# Patient Record
Sex: Male | Born: 1960 | Race: White | Hispanic: No | Smoking: Current some day smoker
Health system: Southern US, Community
[De-identification: ages and names within clinical notes are randomized; demographics above are authoritative.]

## PROBLEM LIST (undated history)

## (undated) DIAGNOSIS — F4310 Post-traumatic stress disorder, unspecified: Secondary | ICD-10-CM

## (undated) DIAGNOSIS — Z87442 Personal history of urinary calculi: Secondary | ICD-10-CM

## (undated) DIAGNOSIS — T148XXA Other injury of unspecified body region, initial encounter: Secondary | ICD-10-CM

## (undated) DIAGNOSIS — R7303 Prediabetes: Secondary | ICD-10-CM

## (undated) DIAGNOSIS — J45909 Unspecified asthma, uncomplicated: Secondary | ICD-10-CM

## (undated) DIAGNOSIS — J189 Pneumonia, unspecified organism: Secondary | ICD-10-CM

## (undated) DIAGNOSIS — R51 Headache: Secondary | ICD-10-CM

## (undated) DIAGNOSIS — J302 Other seasonal allergic rhinitis: Secondary | ICD-10-CM

## (undated) DIAGNOSIS — M1611 Unilateral primary osteoarthritis, right hip: Secondary | ICD-10-CM

## (undated) DIAGNOSIS — R519 Headache, unspecified: Secondary | ICD-10-CM

## (undated) DIAGNOSIS — I82409 Acute embolism and thrombosis of unspecified deep veins of unspecified lower extremity: Secondary | ICD-10-CM

## (undated) DIAGNOSIS — M1990 Unspecified osteoarthritis, unspecified site: Secondary | ICD-10-CM

## (undated) HISTORY — PX: HERNIA REPAIR: SHX51

## (undated) HISTORY — DX: Unilateral primary osteoarthritis, right hip: M16.11

## (undated) HISTORY — DX: Prediabetes: R73.03

## (undated) HISTORY — PX: NASAL FRACTURE SURGERY: SHX718

## (undated) HISTORY — PX: APPENDECTOMY: SHX54

---

## 1998-02-06 ENCOUNTER — Encounter: Admission: RE | Admit: 1998-02-06 | Discharge: 1998-05-07 | Payer: Self-pay | Admitting: Internal Medicine

## 1998-11-08 DIAGNOSIS — I82409 Acute embolism and thrombosis of unspecified deep veins of unspecified lower extremity: Secondary | ICD-10-CM

## 1998-11-08 HISTORY — DX: Acute embolism and thrombosis of unspecified deep veins of unspecified lower extremity: I82.409

## 1999-01-27 ENCOUNTER — Emergency Department (HOSPITAL_COMMUNITY): Admission: EM | Admit: 1999-01-27 | Discharge: 1999-01-27 | Payer: Self-pay | Admitting: Emergency Medicine

## 1999-01-29 ENCOUNTER — Inpatient Hospital Stay (HOSPITAL_COMMUNITY): Admission: EM | Admit: 1999-01-29 | Discharge: 1999-02-06 | Payer: Self-pay | Admitting: Emergency Medicine

## 1999-01-29 ENCOUNTER — Encounter: Payer: Self-pay | Admitting: Emergency Medicine

## 2009-11-27 ENCOUNTER — Emergency Department (HOSPITAL_COMMUNITY): Admission: EM | Admit: 2009-11-27 | Discharge: 2009-11-27 | Payer: Self-pay | Admitting: Emergency Medicine

## 2011-01-25 LAB — PROTIME-INR
INR: 1.8 — ABNORMAL HIGH (ref 0.00–1.49)
Prothrombin Time: 20.7 seconds — ABNORMAL HIGH (ref 11.6–15.2)

## 2011-01-25 LAB — APTT: aPTT: 40 seconds — ABNORMAL HIGH (ref 24–37)

## 2012-07-11 ENCOUNTER — Emergency Department (HOSPITAL_COMMUNITY)
Admission: EM | Admit: 2012-07-11 | Discharge: 2012-07-12 | Disposition: A | Discharge status: Home / Self Care | Payer: Medicare Other | Attending: Emergency Medicine | Admitting: Emergency Medicine

## 2012-07-11 ENCOUNTER — Emergency Department (HOSPITAL_COMMUNITY): Payer: Medicare Other

## 2012-07-11 ENCOUNTER — Encounter (HOSPITAL_COMMUNITY): Payer: Self-pay | Admitting: Emergency Medicine

## 2012-07-11 DIAGNOSIS — M79609 Pain in unspecified limb: Secondary | ICD-10-CM | POA: Insufficient documentation

## 2012-07-11 DIAGNOSIS — M79673 Pain in unspecified foot: Secondary | ICD-10-CM

## 2012-07-11 DIAGNOSIS — R42 Dizziness and giddiness: Secondary | ICD-10-CM | POA: Insufficient documentation

## 2012-07-11 DIAGNOSIS — Z23 Encounter for immunization: Secondary | ICD-10-CM | POA: Insufficient documentation

## 2012-07-11 DIAGNOSIS — T148XXA Other injury of unspecified body region, initial encounter: Secondary | ICD-10-CM

## 2012-07-11 DIAGNOSIS — Z7901 Long term (current) use of anticoagulants: Secondary | ICD-10-CM | POA: Insufficient documentation

## 2012-07-11 DIAGNOSIS — T148 Other injury of unspecified body region: Secondary | ICD-10-CM | POA: Insufficient documentation

## 2012-07-11 DIAGNOSIS — S61209A Unspecified open wound of unspecified finger without damage to nail, initial encounter: Secondary | ICD-10-CM | POA: Insufficient documentation

## 2012-07-11 HISTORY — DX: Other seasonal allergic rhinitis: J30.2

## 2012-07-11 HISTORY — DX: Post-traumatic stress disorder, unspecified: F43.10

## 2012-07-11 HISTORY — DX: Prediabetes: R73.03

## 2012-07-11 MED ORDER — OXYCODONE-ACETAMINOPHEN 5-325 MG PO TABS: 2.0000 | ORAL_TABLET | ORAL | Status: DC | PRN

## 2012-07-11 MED ORDER — LIDOCAINE HCL 2 % IJ SOLN: 10.0000 mL | Freq: Once | INTRAMUSCULAR | Status: DC

## 2012-07-11 MED ORDER — TETANUS-DIPHTH-ACELL PERTUSSIS 5-2.5-18.5 LF-MCG/0.5 IM SUSP
0.5000 mL | Freq: Once | INTRAMUSCULAR | Status: AC
  Administered 2012-07-11: 0.5 mL via INTRAMUSCULAR
  Filled 2012-07-11: qty 0.5

## 2012-07-11 MED ORDER — AMOXICILLIN-POT CLAVULANATE 500-125 MG PO TABS: 1.0000 | ORAL_TABLET | Freq: Three times a day (TID) | ORAL | Status: DC

## 2012-07-11 MED ORDER — RABIES VACCINE, PCEC IM SUSR
1.0000 mL | Freq: Once | INTRAMUSCULAR | Status: AC
  Administered 2012-07-11: 1 mL via INTRAMUSCULAR
  Filled 2012-07-11: qty 1

## 2012-07-11 MED ORDER — IBUPROFEN 800 MG PO TABS
800.0000 mg | ORAL_TABLET | Freq: Once | ORAL | Status: AC
  Administered 2012-07-11: 800 mg via ORAL
  Filled 2012-07-11: qty 1

## 2012-07-11 MED ORDER — RABIES IMMUNE GLOBULIN 150 UNIT/ML IM INJ
20.0000 [IU]/kg | INJECTION | Freq: Once | INTRAMUSCULAR | Status: AC
  Administered 2012-07-11: 1725 [IU] via INTRAMUSCULAR
  Filled 2012-07-11: qty 11.5

## 2012-07-11 NOTE — ED Provider Notes (Signed)
History     CSN: [id number]  Arrival date & time 07/11/12  2030   First MD Initiated Contact with Patient 07/11/12 2213      Chief Complaint  Patient presents with  . Animal Bite    (Consider location/radiation/quality/duration/timing/severity/associated sxs/prior treatment) Patient is a 51 y.o. male presenting with animal bite. The history is provided by the patient. No language interpreter was used.  Animal Bite  The incident occurred yesterday. The incident occurred at home. There is an injury to the left index finger and left thumb. Associated symptoms include light-headedness. Pertinent negatives include no chest pain, no visual disturbance, no nausea, no vomiting, no bladder incontinence, no headaches, no neck pain, no loss of consciousness, no seizures, no weakness, no difficulty breathing and no memory loss. He is right-handed. His tetanus status is out of date. There were no sick contacts.   Transgender male Chrissy with  your complaint of farot bite x2 on his right hand. States that his pet farot  got loose last night outside and bitr him when the animal returned later. Unsure if the animal was exposed to wild animals. The bite is to his right thumb and index nailbed. Patient goes to the [facility] for his medical needs. Also c/o a foreign object in his L lateral mid foot.  Patient is allergic to sulfa. Patient is on Coumadin past medical history of PTSD, borderline diabetes, and seasonal allergies.   Past Medical History  Diagnosis Date  . Seasonal allergies   . Borderline diabetes   . PTSD (post-traumatic stress disorder)     Past Surgical History  Procedure Date  . Appendectomy     Family History  Problem Relation Age of Onset  . Coronary artery disease Father   . Hypertension Other   . Diabetes Other     History  Substance Use Topics  . Smoking status: Current Everyday Smoker    Types: Cigarettes  . Smokeless tobacco: Not on file  . Alcohol Use: No       Review of Systems  Constitutional: Negative.   HENT: Negative.  Negative for neck pain.   Eyes: Negative.  Negative for visual disturbance.  Respiratory: Negative.   Cardiovascular: Negative.  Negative for chest pain.  Gastrointestinal: Negative.  Negative for nausea and vomiting.  Genitourinary: Negative for bladder incontinence.  Musculoskeletal:       Left foot pain Right index and right thumb pain  Neurological: Positive for light-headedness. Negative for seizures, loss of consciousness, weakness and headaches.  Psychiatric/Behavioral: Negative.  Negative for memory loss.  All other systems reviewed and are negative.    Allergies  Sulfa antibiotics  Home Medications   Current Outpatient Rx  Name Route Sig Dispense Refill  . ACETAMINOPHEN 325 MG PO TABS Oral Take 650 mg by mouth every 6 (six) hours as needed.    Marland Kitchen LISINOPRIL 5 MG PO TABS Oral Take 5 mg by mouth daily.    Marland Kitchen LITHIUM CARBONATE 300 MG PO TABS Oral Take 600 mg by mouth daily.    . QUETIAPINE FUMARATE 25 MG PO TABS Oral Take 25 mg by mouth at bedtime.    . TRAMADOL HCL 50 MG PO TABS Oral Take 50 mg by mouth every 6 (six) hours as needed.    . WARFARIN SODIUM 6 MG PO TABS Oral Take 8 mg by mouth daily.      BP 122/62  Pulse 70  Temp 98.4 F (36.9 C) (Oral)  Resp 20  SpO2 100%  Physical Exam  Nursing note and vitals reviewed. Constitutional: He is oriented to person, place, and time. He appears well-developed and well-nourished.  HENT:  Head: Normocephalic.  Eyes: Conjunctivae and EOM are normal. Pupils are equal, round, and reactive to light.  Neck: Normal range of motion. Neck supple.  Cardiovascular: Normal rate.   Pulmonary/Chest: Effort normal.  Abdominal: Soft.  Musculoskeletal: Normal range of motion.  Neurological: He is alert and oriented to person, place, and time.  Skin: Skin is warm and dry.       Right index and right thumb bite marks. Question foreign object in the left lateral  dorsal foot  Psychiatric: He has a normal mood and affect.    ED Course  FOREIGN BODY REMOVAL Date/Time: 07/11/2012 11:50 PM Performed by: Remi Haggard Authorized by: Remi Haggard Consent: Verbal consent obtained. Risks and benefits: risks, benefits and alternatives were discussed Consent given by: patient Patient understanding: patient states understanding of the procedure being performed Patient identity confirmed: verbally with patient, arm band, provided demographic data and hospital-assigned identification number Intake: l foot. Anesthesia: local infiltration Local anesthetic: lidocaine 2% without epinephrine Anesthetic total: 5 ml Patient sedated: no Patient tolerance: Patient tolerated the procedure well with no immediate complications. Comments: ? Glass removal   (including critical care time)  Labs Reviewed - No data to display No results found.   No diagnosis found.    MDM  51 year old transgender treated for animal bite and given rabies vaccine and RhoGAM. Patient will be turn in 7 days and then 21 days for the additional treatment. Also had foreign body removed from his left mid lateral foot. Patient will return in 24-48 hours for a recheck on his. Bite to his right index and right thumb. Patient instructed to wash with antibacterial soap and keep antibiotic ointment and Band-Aids to cover all of these areas.        Remi Haggard, NP 07/11/12 2352

## 2012-07-11 NOTE — ED Notes (Signed)
Pt states was bit by a ferot last night on the right hand  Pt has puncture wounds on the thumb and index finger  Pt states ferots get distemper shots but not vaccinations for rabies    Pt states thinks has glass in the left foot and would like to have that checked as well

## 2012-07-13 NOTE — ED Provider Notes (Signed)
Medical screening examination/treatment/procedure(s) were performed by non-physician practitioner and as supervising physician I was immediately available for consultation/collaboration.  Ajit Errico, MD 07/13/12 0030 

## 2012-07-15 ENCOUNTER — Encounter (HOSPITAL_COMMUNITY): Payer: Self-pay | Admitting: *Deleted

## 2012-07-15 ENCOUNTER — Emergency Department (HOSPITAL_COMMUNITY)
Admission: EM | Admit: 2012-07-15 | Discharge: 2012-07-15 | Disposition: A | Discharge status: Home / Self Care | Payer: Medicare Other | Attending: Emergency Medicine | Admitting: Emergency Medicine

## 2012-07-15 DIAGNOSIS — Z7982 Long term (current) use of aspirin: Secondary | ICD-10-CM | POA: Insufficient documentation

## 2012-07-15 DIAGNOSIS — F17200 Nicotine dependence, unspecified, uncomplicated: Secondary | ICD-10-CM | POA: Insufficient documentation

## 2012-07-15 DIAGNOSIS — T6391XA Toxic effect of contact with unspecified venomous animal, accidental (unintentional), initial encounter: Secondary | ICD-10-CM | POA: Insufficient documentation

## 2012-07-15 DIAGNOSIS — T63391A Toxic effect of venom of other spider, accidental (unintentional), initial encounter: Secondary | ICD-10-CM | POA: Insufficient documentation

## 2012-07-15 DIAGNOSIS — F4310 Post-traumatic stress disorder, unspecified: Secondary | ICD-10-CM | POA: Insufficient documentation

## 2012-07-15 DIAGNOSIS — Z8249 Family history of ischemic heart disease and other diseases of the circulatory system: Secondary | ICD-10-CM | POA: Insufficient documentation

## 2012-07-15 DIAGNOSIS — Z833 Family history of diabetes mellitus: Secondary | ICD-10-CM | POA: Insufficient documentation

## 2012-07-15 DIAGNOSIS — Z882 Allergy status to sulfonamides status: Secondary | ICD-10-CM | POA: Insufficient documentation

## 2012-07-15 DIAGNOSIS — Z7901 Long term (current) use of anticoagulants: Secondary | ICD-10-CM | POA: Insufficient documentation

## 2012-07-15 DIAGNOSIS — T63301A Toxic effect of unspecified spider venom, accidental (unintentional), initial encounter: Secondary | ICD-10-CM

## 2012-07-15 MED ORDER — AMOXICILLIN-POT CLAVULANATE 875-125 MG PO TABS
1.0000 | ORAL_TABLET | Freq: Once | ORAL | Status: AC
  Administered 2012-07-15: 1 via ORAL
  Filled 2012-07-15: qty 1

## 2012-07-15 NOTE — ED Notes (Signed)
Pt reports being bitten by a spider tonight. Pt saw a spider and noticed a bite on back of hand after killing the spider.

## 2012-07-15 NOTE — ED Notes (Signed)
Pt states about an hour ago he/she was getting out her box of ammo in garage and she thinks a spider bit her between her right 3rd and 4th finger,  Pt is alert and oriented in NAD

## 2012-07-15 NOTE — ED Notes (Signed)
Pt applied Windex, then naptha on bite. Pt reports also putting alcohol on bite area.

## 2012-07-15 NOTE — ED Provider Notes (Signed)
History     CSN: [id number]  Arrival date & time 07/15/12  0234   First MD Initiated Contact with Patient 07/15/12 0401      Chief Complaint  Patient presents with  . Insect Bite    (Consider location/radiation/quality/duration/timing/severity/associated sxs/prior treatment) HPI Hx per PT, moving boxes and noticed a spider, felt a bite on R hand, did not see a spider bite him. Noticed there was redness and swelling and presents here, now swelling and redness are better. No ABD pain or cramps, no sig pain. Was evaluated for animal bite 4 days ago. Did not fill Rx Augmentin. Did get rabies series initiated. Mild in severity. Past Medical History  Diagnosis Date  . Seasonal allergies   . Borderline diabetes   . PTSD (post-traumatic stress disorder)     Past Surgical History  Procedure Date  . Appendectomy     Family History  Problem Relation Age of Onset  . Coronary artery disease Father   . Hypertension Other   . Diabetes Other     History  Substance Use Topics  . Smoking status: Current Everyday Smoker    Types: Cigarettes  . Smokeless tobacco: Not on file  . Alcohol Use: No      Review of Systems  Constitutional: Negative for fever and chills.  HENT: Negative for neck pain and neck stiffness.   Eyes: Negative for pain.  Respiratory: Negative for shortness of breath.   Cardiovascular: Negative for chest pain.  Gastrointestinal: Negative for abdominal pain.  Genitourinary: Negative for dysuria.  Musculoskeletal: Negative for back pain.  Skin: Positive for wound. Negative for rash.  Neurological: Negative for headaches.  All other systems reviewed and are negative.    Allergies  Sulfa antibiotics  Home Medications   Current Outpatient Rx  Name Route Sig Dispense Refill  . ASPIRIN EC 81 MG PO TBEC Oral Take 81 mg by mouth daily.    Marland Kitchen FLUTICASONE-SALMETEROL 250-50 MCG/DOSE IN AEPB Inhalation Inhale 1 puff into the lungs every 12 (twelve) hours.    .  IBUPROFEN 200 MG PO TABS Oral Take 800 mg by mouth every 6 (six) hours as needed. For pain    . LISINOPRIL 5 MG PO TABS Oral Take 5 mg by mouth daily.    Marland Kitchen LITHIUM CARBONATE 300 MG PO TABS Oral Take 600 mg by mouth daily.    . QUETIAPINE FUMARATE 25 MG PO TABS Oral Take 25 mg by mouth at bedtime.    Marland Kitchen TIOTROPIUM BROMIDE MONOHYDRATE 18 MCG IN CAPS Inhalation Place 18 mcg into inhaler and inhale daily.    . WARFARIN SODIUM 2 MG PO TABS Oral Take 2.5 mg by mouth daily. Take along with a 5mg  tablet to make a total dose of 8mg .    . WARFARIN SODIUM 5 MG PO TABS Oral Take 5 mg by mouth daily. Take along with 1 and 1/2 tablets of a 2mg  tablet to make a total dose of 8mg .      BP 119/83  Pulse 84  Temp 99.1 F (37.3 C) (Oral)  Resp 16  SpO2 98%  Physical Exam  Constitutional: He is oriented to person, place, and time. He appears well-developed and well-nourished.  HENT:  Head: Normocephalic and atraumatic.  Eyes: Conjunctivae and EOM are normal. Pupils are equal, round, and reactive to light.  Neck: Trachea normal. Neck supple. No thyromegaly present.  Cardiovascular: Normal rate, regular rhythm, S1 normal, S2 normal and normal pulses.     No systolic murmur  is present   No diastolic murmur is present  Pulses:      Radial pulses are 2+ on the right side, and 2+ on the left side.  Pulmonary/Chest: Effort normal and breath sounds normal. He has no wheezes. He has no rhonchi. He has no rales. He exhibits no tenderness.  Abdominal: Soft. Normal appearance and bowel sounds are normal. There is no tenderness. There is no CVA tenderness and negative Murphy's sign.  Musculoskeletal:       RUE : puncture wound dorsum of R hand area of web space b/t 4/5th.  Distal n/v intact, no deep wound or bleeding, no sig swelling or inc warmth to touch, no necrosis or tenderness.   Neurological: He is alert and oriented to person, place, and time. He has normal strength. No cranial nerve deficit or sensory  deficit. GCS eye subscore is 4. GCS verbal subscore is 5. GCS motor subscore is 6.  Skin: Skin is warm and dry. No rash noted. He is not diaphoretic.  Psychiatric: His speech is normal.       Cooperative and appropriate    ED Course  Procedures (including critical care time)   Augmentin PO. Wound care. Tetanus UTD.  Spider bite precautions provided.   MDM   VS and nursing notes reviewed, old records reviewed and Augmentin provided - PT agrees to fill Rx as prescribed 3 days ago. Return precautions verbalized as understood.         Sunnie Nielsen, MD 07/15/12 [phone]

## 2014-01-08 ENCOUNTER — Emergency Department (HOSPITAL_COMMUNITY): Payer: Medicare Other

## 2014-01-08 ENCOUNTER — Emergency Department (HOSPITAL_COMMUNITY)
Admission: EM | Admit: 2014-01-08 | Discharge: 2014-01-08 | Disposition: A | Discharge status: Home / Self Care | Payer: Medicare Other | Attending: Emergency Medicine | Admitting: Emergency Medicine

## 2014-01-08 ENCOUNTER — Encounter (HOSPITAL_COMMUNITY): Payer: Self-pay | Admitting: Emergency Medicine

## 2014-01-08 DIAGNOSIS — Z7982 Long term (current) use of aspirin: Secondary | ICD-10-CM | POA: Insufficient documentation

## 2014-01-08 DIAGNOSIS — F17200 Nicotine dependence, unspecified, uncomplicated: Secondary | ICD-10-CM | POA: Insufficient documentation

## 2014-01-08 DIAGNOSIS — Z7901 Long term (current) use of anticoagulants: Secondary | ICD-10-CM | POA: Insufficient documentation

## 2014-01-08 DIAGNOSIS — R0789 Other chest pain: Secondary | ICD-10-CM

## 2014-01-08 DIAGNOSIS — S86819A Strain of other muscle(s) and tendon(s) at lower leg level, unspecified leg, initial encounter: Principal | ICD-10-CM

## 2014-01-08 DIAGNOSIS — F4310 Post-traumatic stress disorder, unspecified: Secondary | ICD-10-CM | POA: Insufficient documentation

## 2014-01-08 DIAGNOSIS — S4980XA Other specified injuries of shoulder and upper arm, unspecified arm, initial encounter: Secondary | ICD-10-CM | POA: Insufficient documentation

## 2014-01-08 DIAGNOSIS — IMO0002 Reserved for concepts with insufficient information to code with codable children: Secondary | ICD-10-CM | POA: Insufficient documentation

## 2014-01-08 DIAGNOSIS — Z86718 Personal history of other venous thrombosis and embolism: Secondary | ICD-10-CM | POA: Insufficient documentation

## 2014-01-08 DIAGNOSIS — X500XXA Overexertion from strenuous movement or load, initial encounter: Secondary | ICD-10-CM | POA: Insufficient documentation

## 2014-01-08 DIAGNOSIS — Y929 Unspecified place or not applicable: Secondary | ICD-10-CM | POA: Insufficient documentation

## 2014-01-08 DIAGNOSIS — S838X9A Sprain of other specified parts of unspecified knee, initial encounter: Secondary | ICD-10-CM | POA: Insufficient documentation

## 2014-01-08 DIAGNOSIS — Z79899 Other long term (current) drug therapy: Secondary | ICD-10-CM | POA: Insufficient documentation

## 2014-01-08 DIAGNOSIS — S29011A Strain of muscle and tendon of front wall of thorax, initial encounter: Secondary | ICD-10-CM

## 2014-01-08 DIAGNOSIS — Y9389 Activity, other specified: Secondary | ICD-10-CM | POA: Insufficient documentation

## 2014-01-08 DIAGNOSIS — J45909 Unspecified asthma, uncomplicated: Secondary | ICD-10-CM | POA: Insufficient documentation

## 2014-01-08 DIAGNOSIS — S46909A Unspecified injury of unspecified muscle, fascia and tendon at shoulder and upper arm level, unspecified arm, initial encounter: Secondary | ICD-10-CM | POA: Insufficient documentation

## 2014-01-08 HISTORY — DX: Acute embolism and thrombosis of unspecified deep veins of unspecified lower extremity: I82.409

## 2014-01-08 HISTORY — DX: Unspecified asthma, uncomplicated: J45.909

## 2014-01-08 LAB — CBC
HEMATOCRIT: 45.3 % (ref 39.0–52.0)
HEMOGLOBIN: 15.8 g/dL (ref 13.0–17.0)
MCH: 31.7 pg (ref 26.0–34.0)
MCHC: 34.9 g/dL (ref 30.0–36.0)
MCV: 91 fL (ref 78.0–100.0)
Platelets: 280 10*3/uL (ref 150–400)
RBC: 4.98 MIL/uL (ref 4.22–5.81)
RDW: 13.4 % (ref 11.5–15.5)
WBC: 11.9 10*3/uL — ABNORMAL HIGH (ref 4.0–10.5)

## 2014-01-08 LAB — BASIC METABOLIC PANEL
BUN: 15 mg/dL (ref 6–23)
CALCIUM: 10 mg/dL (ref 8.4–10.5)
CO2: 21 meq/L (ref 19–32)
Chloride: 99 mEq/L (ref 96–112)
Creatinine, Ser: 0.68 mg/dL (ref 0.50–1.35)
GFR calc Af Amer: >90 mL/min (ref >90)
GFR calc non Af Amer: >90 mL/min (ref >90)
GLUCOSE: 71 mg/dL (ref 70–99)
Potassium: 4.7 mEq/L (ref 3.7–5.3)
SODIUM: 137 meq/L (ref 137–147)

## 2014-01-08 LAB — D-DIMER, QUANTITATIVE: D-Dimer, Quant: 0.3 ug/mL-FEU (ref 0.00–0.48)

## 2014-01-08 LAB — I-STAT TROPONIN, ED: TROPONIN I, POC: 0 ng/mL (ref 0.00–0.08)

## 2014-01-08 MED ORDER — IBUPROFEN 800 MG PO TABS
800.0000 mg | ORAL_TABLET | Freq: Once | ORAL | Status: AC
  Administered 2014-01-08: 800 mg via ORAL
  Filled 2014-01-08: qty 1

## 2014-01-08 MED ORDER — HYDROCODONE-ACETAMINOPHEN 5-325 MG PO TABS
1.0000 | ORAL_TABLET | ORAL | Status: DC | PRN
Start: 2014-01-08 — End: 2017-02-02

## 2014-01-08 NOTE — ED Provider Notes (Addendum)
CSN: [id number]     Arrival date & time 01/08/14  1920 History   First MD Initiated Contact with Patient 01/08/14 2025     Chief Complaint  Patient presents with  . Chest Pain  . Back Pain     (Consider location/radiation/quality/duration/timing/severity/associated sxs/prior Treatment) HPI Comments: 53 year old male presents with 3 days of chest pain. He states that 4 days ago he was handed a heavy bag of groceries all of a sudden in his left arm. The next day started having left arm, chest, and back pain. He states it does hurt worse with inspiration. It is worse with its length flat. Better with sitting up. The pain is about 8/10. His been taking ibuprofen and Tylenol without relief. He did take a tramadol which seemed to sort of help. Has not had a cough or shortness of breath. 5 years ago he had a DVT and was put on Xarelto. He's been off for the last several months because of its expense. A few days ago because he started having chest pain he started back on it. He's never had a pulmonary embolism. The pain radiates from his chest around his back. He also feels like his triceps hurts.   Past Medical History  Diagnosis Date  . Seasonal allergies   . Borderline diabetes   . PTSD (post-traumatic stress disorder)   . 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 Asthma    Past Surgical History  Procedure Laterality Date  . Appendectomy    . Nasal fracture surgery    . Hernia repair     Family History  Problem Relation Age of Onset  . Coronary artery disease Father   . Hypertension Other   . Diabetes Other    History  Substance Use Topics  . Smoking status: Current Every Day Smoker -- 0.50 packs/day    Types: Cigarettes  . Smokeless tobacco: Not on file  . Alcohol Use: No    Review of Systems  Constitutional: Negative for fever and chills.  Respiratory: Negative for shortness of breath.   Cardiovascular: Positive for chest pain.  Gastrointestinal: Negative for vomiting  and abdominal pain.  Musculoskeletal: Positive for back pain.  Neurological: Negative for weakness.  All other systems reviewed and are negative.      Allergies  Sulfa antibiotics  Home Medications   Current Outpatient Rx  Name  Route  Sig  Dispense  Refill  . albuterol (PROVENTIL HFA;VENTOLIN HFA) 108 (90 BASE) MCG/ACT inhaler   Inhalation   Inhale 1-2 puffs into the lungs every 6 (six) hours as needed for wheezing or shortness of breath.         Marland Kitchen. aspirin 325 MG tablet   Oral   Take 325 mg by mouth daily.         . citalopram (CELEXA) 40 MG tablet   Oral   Take 40 mg by mouth daily.         . Fluticasone-Salmeterol (ADVAIR) 250-50 MCG/DOSE AEPB   Inhalation   Inhale 1 puff into the lungs every 12 (twelve) hours.         Marland Kitchen. ibuprofen (ADVIL,MOTRIN) 200 MG tablet   Oral   Take 800 mg by mouth every 6 (six) hours as needed. For pain         . lithium 300 MG tablet   Oral   Take 600 mg by mouth daily.         . QUEtiapine (SEROQUEL) 200 MG  tablet   Oral   Take 50 mg by mouth at bedtime.         . Rivaroxaban (XARELTO) 20 MG TABS tablet   Oral   Take 20 mg by mouth daily with supper.         . tiotropium (SPIRIVA) 18 MCG inhalation capsule   Inhalation   Place 18 mcg into inhaler and inhale daily.         . traMADol (ULTRAM) 50 MG tablet   Oral   Take 50-100 mg by mouth every 6 (six) hours as needed for moderate pain or severe pain.          BP 154/98  Pulse 74  Resp 14  SpO2 97% Physical Exam  Nursing note and vitals reviewed. Constitutional: He is oriented to person, place, and time. He appears well-developed and well-nourished.  HENT:  Head: Normocephalic and atraumatic.  Right Ear: External ear normal.  Left Ear: External ear normal.  Nose: Nose normal.  Eyes: Right eye exhibits no discharge. Left eye exhibits no discharge.  Neck: Neck supple.  Cardiovascular: Normal rate, regular rhythm, normal heart sounds and intact distal  pulses.   Pulses:      Radial pulses are 2+ on the right side, and 2+ on the left side.  Pulmonary/Chest: Effort normal. He exhibits tenderness (mild left-sided chest tenderness.).  Abdominal: Soft. There is no tenderness.  Musculoskeletal: He exhibits no edema.       Back:       Left upper arm: He exhibits tenderness (over left tricep, worse with extension of arm).  Neurological: He is alert and oriented to person, place, and time.  Skin: Skin is warm and dry.    ED Course  Procedures (including critical care time) Labs Review Labs Reviewed  CBC - Abnormal; Notable for the following:    WBC 11.9 (*)    All other components within normal limits  BASIC METABOLIC PANEL  D-DIMER, QUANTITATIVE  I-STAT TROPOININ, ED   Imaging Review Dg Chest 2 View  01/08/2014   CLINICAL DATA:  Chest and back pain  EXAM: CHEST  2 VIEW  COMPARISON:  11/27/2009  FINDINGS: The heart size and mediastinal contours are within normal limits. Both lungs are clear. The visualized skeletal structures are unremarkable.  IMPRESSION: No active cardiopulmonary disease.   Electronically Signed   By: Ruel Favors M.D.   On: 01/08/2014 21:26     EKG Interpretation   Date/Time:  Tuesday January 08 2014 19:38:20 EST Ventricular Rate:  74 PR Interval:  205 QRS Duration: 92 QT Interval:  385 QTC Calculation: 427 R Axis:   13 Text Interpretation:  Sinus rhythm Borderline prolonged PR interval no  acute ischemia No old tracing to compare Confirmed by Starletta Houchin  MD, Jaelin Fackler  ([id number]) on 01/08/2014 9:06:49 PM      MDM   Final diagnoses:  Chest wall pain  Muscle strain of chest wall    Patient's pain appears MSK given his recent history of lifting a heavy object. Remote history of DVT, but with no hypoxia, tachycardia or current signs of DVT I feel he is low risk. Doubt PE with negative ddimer as well. Specifically tender in chest, back and triceps. Given his prolonged symptoms (over 48 hours), negative EKG, and negative  troponin I highly doubt ACS. Will treat pain and have him f/u with his PCP.    Audree Camel, MD 01/09/14 1610  Audree Camel, MD 01/09/14 [phone]

## 2014-01-08 NOTE — Discharge Instructions (Signed)

## 2014-01-08 NOTE — ED Notes (Signed)
Patient transported to X-ray 

## 2014-01-08 NOTE — ED Notes (Signed)
Pt reports searing pain to L upper back onset Sunday morning, present upon waking up, pain spread to right and left chest and down L arm. Pt states lifted something heavy Saturday night, thinks it might be a pulled muscle. Denies SOB, dizziness, nausea, or other sx. Pt states was on blood thinner, stopped taking it 5 wks ago because couldn't afford it, then started back on it yesterday.

## 2015-04-01 ENCOUNTER — Inpatient Hospital Stay (HOSPITAL_COMMUNITY): Admission: RE | Admit: 2015-04-01 | Payer: Medicare Other | Source: Ambulatory Visit | Admitting: Orthopedic Surgery

## 2015-04-01 ENCOUNTER — Encounter (HOSPITAL_COMMUNITY): Admission: RE | Payer: Self-pay | Source: Ambulatory Visit

## 2015-04-01 SURGERY — ARTHROPLASTY, HIP, TOTAL, ANTERIOR APPROACH
Anesthesia: Spinal | Site: Hip | Laterality: Left

## 2016-07-07 ENCOUNTER — Ambulatory Visit: Payer: Medicare Other | Admitting: Cardiovascular Disease

## 2016-12-15 DIAGNOSIS — Z789 Other specified health status: Secondary | ICD-10-CM | POA: Insufficient documentation

## 2016-12-15 DIAGNOSIS — J45909 Unspecified asthma, uncomplicated: Secondary | ICD-10-CM | POA: Insufficient documentation

## 2016-12-15 DIAGNOSIS — F4310 Post-traumatic stress disorder, unspecified: Secondary | ICD-10-CM | POA: Insufficient documentation

## 2016-12-15 DIAGNOSIS — M1611 Unilateral primary osteoarthritis, right hip: Secondary | ICD-10-CM

## 2016-12-15 DIAGNOSIS — F640 Transsexualism: Secondary | ICD-10-CM | POA: Insufficient documentation

## 2016-12-15 DIAGNOSIS — F17210 Nicotine dependence, cigarettes, uncomplicated: Secondary | ICD-10-CM | POA: Insufficient documentation

## 2016-12-15 DIAGNOSIS — F329 Major depressive disorder, single episode, unspecified: Secondary | ICD-10-CM | POA: Insufficient documentation

## 2016-12-15 HISTORY — DX: Unilateral primary osteoarthritis, right hip: M16.11

## 2017-01-11 ENCOUNTER — Encounter (HOSPITAL_COMMUNITY): Payer: Self-pay | Admitting: *Deleted

## 2017-01-26 NOTE — Progress Notes (Signed)
Preop on 01/27/17.  Surgery on 02/01/17.  Need orders in epic.  Thank You.

## 2017-01-26 NOTE — Patient Instructions (Addendum)
Corey PeerChristopher R Lacross  01/26/2017   Your procedure is scheduled on: 02/01/17  [facility]  [facility]  To admitting at 805   Call this number if you have problems the morning of surgery [phone]   Remember: ONLY 1 PERSON MAY GO WITH YOU TO SHORT STAY TO GET  READY MORNING OF YOUR SURGERY.  Do not eat food or drink liquids after midnight.  Answer any phone number that starts with [phone]     _____________________________________________________________________     Take these medicines the morning of surgery with A SIP OF WATER:  Albuterol if needed(bring), celexa, lithium   DO NOT TAKE ANY DIABETIC MEDICATIONS DAY OF YOUR SURGERY                               You may not have any metal on your body including hair pins and              piercings  Do not wear jewelry, make-up, lotions, powders or perfumes, deodorant             Do not wear nail polish.  Do not shave  48 hours prior to surgery.              Men may shave face and neck.   Do not bring valuables to the hospital. [facility] IS NOT             RESPONSIBLE   FOR VALUABLES.  Contacts, dentures or bridgework may not be worn into surgery.  Leave suitcase in the car. After surgery it may be brought to your room.                  Please read over the following fact sheets you were given: ____________________________________________________________________            [facility] - Preparing [facility] Before surgery, you can play an important role.  Because skin is not sterile, your skin needs to be as free of germs as possible.  You can reduce the number of germs on your skin by washing with CHG (chlorahexidine gluconate) soap before surgery.  CHG is an antiseptic cleaner which kills germs and bonds with the skin to continue killing germs even after washing. Please DO NOT use if you have an allergy to CHG or antibacterial soaps.  If your skin becomes reddened/irritated stop using  the CHG and inform your nurse when you arrive at Short Stay. Do not shave (including legs and underarms) for at least 48 hours prior to the first CHG shower.  You may shave your face/neck. Please follow these instructions carefully:  1.  Shower with CHG Soap the night before surgery and the  morning of Surgery.  2.  If you choose to wash your hair, wash your hair first as usual with your  normal  shampoo.  3.  After you shampoo, rinse your hair and body thoroughly to remove the  shampoo.                           4.  Use CHG as you would any other liquid soap.  You can apply chg directly  to the skin and wash  Gently with a scrungie or clean washcloth.  5.  Apply the CHG Soap to your body ONLY FROM THE NECK DOWN.   Do not use on face/ open                           Wound or open sores. Avoid contact with eyes, ears mouth and genitals (private parts).                       Wash face,  Genitals (private parts) with your normal soap.             6.  Wash thoroughly, paying special attention to the area where your surgery  will be performed.  7.  Thoroughly rinse your body with warm water from the neck down.  8.  DO NOT shower/wash with your normal soap after using and rinsing off  the CHG Soap.                9.  Pat yourself dry with a clean towel.            10.  Wear clean pajamas.            11.  Place clean sheets on your bed the night of your first shower and do not  sleep with pets. Day of Surgery : Do not apply any lotions/deodorants the morning of surgery.  Please wear clean clothes to the hospital/surgery center.  FAILURE TO FOLLOW THESE INSTRUCTIONS MAY RESULT IN THE CANCELLATION OF YOUR SURGERY PATIENT SIGNATURE_________________________________  NURSE SIGNATURE__________________________________  ________________________________________________________________________  WHAT IS A BLOOD TRANSFUSION? Blood Transfusion Information  A transfusion is the replacement  of blood or some of its parts. Blood is made up of multiple cells which provide different functions.  Red blood cells carry oxygen and are used for blood loss replacement.  White blood cells fight against infection.  Platelets control bleeding.  Plasma helps clot blood.  Other blood products are available for specialized needs, such as hemophilia or other clotting disorders. BEFORE THE TRANSFUSION  Who gives blood for transfusions?   Healthy volunteers who are fully evaluated to make sure their blood is safe. This is blood bank blood. Transfusion therapy is the safest it has ever been in the practice of medicine. Before blood is taken from a donor, a complete history is taken to make sure that person has no history of diseases nor engages in risky social behavior (examples are intravenous drug use or sexual activity with multiple partners). The donor's travel history is screened to minimize risk of transmitting infections, such as malaria. The donated blood is tested for signs of infectious diseases, such as HIV and hepatitis. The blood is then tested to be sure it is compatible with you in order to minimize the chance of a transfusion reaction. If you or a relative donates blood, this is often done in anticipation of surgery and is not appropriate for emergency situations. It takes many days to process the donated blood. RISKS AND COMPLICATIONS Although transfusion therapy is very safe and saves many lives, the main dangers of transfusion include:   Getting an infectious disease.  Developing a transfusion reaction. This is an allergic reaction to something in the blood you were given. Every precaution is taken to prevent this. The decision to have a blood transfusion has been considered carefully by your caregiver before blood is given. Blood is not given unless the benefits outweigh  the risks. AFTER THE TRANSFUSION  Right after receiving a blood transfusion, you will usually feel much  better and more energetic. This is especially true if your red blood cells have gotten low (anemic). The transfusion raises the level of the red blood cells which carry oxygen, and this usually causes an energy increase.  The nurse administering the transfusion will monitor you carefully for complications. HOME CARE INSTRUCTIONS  No special instructions are needed after a transfusion. You may find your energy is better. Speak with your caregiver about any limitations on activity for underlying diseases you may have. SEEK MEDICAL CARE IF:   Your condition is not improving after your transfusion.  You develop redness or irritation at the intravenous (IV) site. SEEK IMMEDIATE MEDICAL CARE IF:  Any of the following symptoms occur over the next 12 hours:  Shaking chills.  You have a temperature by mouth above 102 F (38.9 C), not controlled by medicine.  Chest, back, or muscle pain.  People around you feel you are not acting correctly or are confused.  Shortness of breath or difficulty breathing.  Dizziness and fainting.  You get a rash or develop hives.  You have a decrease in urine output.  Your urine turns a dark color or changes to pink, red, or brown. Any of the following symptoms occur over the next 10 days:  You have a temperature by mouth above 102 F (38.9 C), not controlled by medicine.  Shortness of breath.  Weakness after normal activity.  The white part of the eye turns yellow (jaundice).  You have a decrease in the amount of urine or are urinating less often.  Your urine turns a dark color or changes to pink, red, or brown. Document Released: 10/22/2000 Document Revised: 01/17/2012 Document Reviewed: 06/10/2008 ExitCare Patient Information 2014 [place].  _______________________________________________________________________  Incentive Spirometer  An incentive spirometer is a tool that can help keep your lungs clear and active. This tool measures  how well you are filling your lungs with each breath. Taking long deep breaths may help reverse or decrease the chance of developing breathing (pulmonary) problems (especially infection) following:  A long period of time when you are unable to move or be active. BEFORE THE PROCEDURE   If the spirometer includes an indicator to show your best effort, your nurse or respiratory therapist will set it to a desired goal.  If possible, sit up straight or lean slightly forward. Try not to slouch.  Hold the incentive spirometer in an upright position. INSTRUCTIONS FOR USE  1. Sit on the edge of your bed if possible, or sit up as far as you can in bed or on a chair. 2. Hold the incentive spirometer in an upright position. 3. Breathe out normally. 4. Place the mouthpiece in your mouth and seal your lips tightly around it. 5. Breathe in slowly and as deeply as possible, raising the piston or the ball toward the top of the column. 6. Hold your breath for 3-5 seconds or for as long as possible. Allow the piston or ball to fall to the bottom of the column. 7. Remove the mouthpiece from your mouth and breathe out normally. 8. Rest for a few seconds and repeat Steps 1 through 7 at least 10 times every 1-2 hours when you are awake. Take your time and take a few normal breaths between deep breaths. 9. The spirometer may include an indicator to show your best effort. Use the indicator as a goal to work  toward during each repetition. 10. After each set of 10 deep breaths, practice coughing to be sure your lungs are clear. If you have an incision (the cut made at the time of surgery), support your incision when coughing by placing a pillow or rolled up towels firmly against it. Once you are able to get out of bed, walk around indoors and cough well. You may stop using the incentive spirometer when instructed by your caregiver.  RISKS AND COMPLICATIONS  Take your time so you do not get dizzy or light-headed.  If  you are in pain, you may need to take or ask for pain medication before doing incentive spirometry. It is harder to take a deep breath if you are having pain. AFTER USE  Rest and breathe slowly and easily.  It can be helpful to keep track of a log of your progress. Your caregiver can provide you with a simple table to help with this. If you are using the spirometer at home, follow these instructions: SEEK MEDICAL CARE IF:   You are having difficultly using the spirometer.  You have trouble using the spirometer as often as instructed.  Your pain medication is not giving enough relief while using the spirometer.  You develop fever of 100.5 F (38.1 C) or higher. SEEK IMMEDIATE MEDICAL CARE IF:   You cough up bloody sputum that had not been present before.  You develop fever of 102 F (38.9 C) or greater.  You develop worsening pain at or near the incision site. MAKE SURE YOU:   Understand these instructions.  Will watch your condition.  Will get help right away if you are not doing well or get worse. Document Released: 03/07/2007 Document Revised: 01/17/2012 Document Reviewed: 05/08/2007 ExitCare Patient Information 2014 ExitCare, [place].   ________________________________________________________________________ How to Manage Your Diabetes Before and After Surgery  Why is it important to control my blood sugar before and after surgery? . Improving blood sugar levels before and after surgery helps healing and can limit problems. . A way of improving blood sugar control is eating a healthy diet by: o  Eating less sugar and carbohydrates o  Increasing activity/exercise o  Talking with your doctor about reaching your blood sugar goals . High blood sugars (greater than 180 mg/dL) can raise your risk of infections and slow your recovery, so you will need to focus on controlling your diabetes during the weeks before surgery. . Make sure that the doctor who takes care of your  diabetes knows about your planned surgery including the date and location.  How do I manage my blood sugar before surgery? . Check your blood sugar at least 4 times a day, starting 2 days before surgery, to make sure that the level is not too high or low. o Check your blood sugar the morning of your surgery when you wake up and every 2 hours until you get to the Short Stay unit. . If your blood sugar is less than 70 mg/dL, you will need to treat for low blood sugar: o Do not take insulin. o Treat a low blood sugar (less than 70 mg/dL) with  cup of clear juice (cranberry or apple), 4 glucose tablets, OR glucose gel. o Recheck blood sugar in 15 minutes after treatment (to make sure it is greater than 70 mg/dL). If your blood sugar is not greater than 70 mg/dL on recheck, call [phone] for further instructions. . Report your blood sugar to the short stay nurse when you get  to Short Stay.  . If you are admitted to the hospital after surgery: o Your blood sugar will be checked by the staff and you will probably be given insulin after surgery (instead of oral diabetes medicines) to make sure you have good blood sugar levels. o The goal for blood sugar control after surgery is 80-180 mg/dL.   What do I do about my diabetes medication?  You may take your metformin asu usual the day before surgery on 01-31-17 Do not take metformin on day of surgery 02-01-17

## 2017-01-27 ENCOUNTER — Encounter (HOSPITAL_COMMUNITY): Payer: Self-pay

## 2017-01-27 ENCOUNTER — Encounter (HOSPITAL_COMMUNITY)
Admission: RE | Admit: 2017-01-27 | Discharge: 2017-01-27 | Disposition: A | Discharge status: Home / Self Care | Payer: Medicare (Managed Care) | Source: Ambulatory Visit | Attending: Orthopedic Surgery | Admitting: Orthopedic Surgery

## 2017-01-27 DIAGNOSIS — Z01810 Encounter for preprocedural cardiovascular examination: Secondary | ICD-10-CM | POA: Insufficient documentation

## 2017-01-27 DIAGNOSIS — Z01812 Encounter for preprocedural laboratory examination: Secondary | ICD-10-CM | POA: Diagnosis not present

## 2017-01-27 DIAGNOSIS — R9431 Abnormal electrocardiogram [ECG] [EKG]: Secondary | ICD-10-CM | POA: Diagnosis not present

## 2017-01-27 DIAGNOSIS — M1612 Unilateral primary osteoarthritis, left hip: Secondary | ICD-10-CM | POA: Insufficient documentation

## 2017-01-27 HISTORY — DX: Personal history of urinary calculi: Z87.442

## 2017-01-27 HISTORY — DX: Headache, unspecified: R51.9

## 2017-01-27 HISTORY — DX: Pneumonia, unspecified organism: J18.9

## 2017-01-27 HISTORY — DX: Headache: R51

## 2017-01-27 HISTORY — DX: Unspecified osteoarthritis, unspecified site: M19.90

## 2017-01-27 LAB — BASIC METABOLIC PANEL
Anion gap: 8 (ref 5–15)
BUN: 11 mg/dL (ref 6–20)
CHLORIDE: 109 mmol/L (ref 101–111)
CO2: 21 mmol/L — AB (ref 22–32)
Calcium: 9 mg/dL (ref 8.9–10.3)
Creatinine, Ser: 0.71 mg/dL (ref 0.61–1.24)
GFR calc non Af Amer: >60 mL/min (ref >60)
Glucose, Bld: 110 mg/dL — ABNORMAL HIGH (ref 65–99)
Potassium: 4.1 mmol/L (ref 3.5–5.1)
SODIUM: 138 mmol/L (ref 135–145)

## 2017-01-27 LAB — CBC
HCT: 39 % (ref 39.0–52.0)
HEMOGLOBIN: 13.5 g/dL (ref 13.0–17.0)
MCH: 31 pg (ref 26.0–34.0)
MCHC: 34.6 g/dL (ref 30.0–36.0)
MCV: 89.4 fL (ref 78.0–100.0)
PLATELETS: 255 10*3/uL (ref 150–400)
RBC: 4.36 MIL/uL (ref 4.22–5.81)
RDW: 13.7 % (ref 11.5–15.5)
WBC: 8.2 10*3/uL (ref 4.0–10.5)

## 2017-01-27 LAB — SURGICAL PCR SCREEN
MRSA, PCR: NEGATIVE
STAPHYLOCOCCUS AUREUS: NEGATIVE

## 2017-01-27 LAB — ABO/RH: ABO/RH(D): A POS

## 2017-01-27 LAB — GLUCOSE, CAPILLARY: Glucose-Capillary: 141 mg/dL — ABNORMAL HIGH (ref 65–99)

## 2017-01-27 NOTE — Progress Notes (Signed)
Clearance dr Mardelle Matteandy 12-15-16 on chart

## 2017-01-30 NOTE — H&P (Signed)
TOTAL HIP ADMISSION H&P  Patient is admitted for left total hip arthroplasty, anterior approach.  Subjective:  Chief Complaint:   Left hip primary OA / pain  HPI: Corey Collins, 56 y.o. male, has a history of pain and functional disability in the left hip(s) due to arthritis and patient has failed non-surgical conservative treatments for greater than 12 weeks to include NSAID's and/or analgesics, corticosteriod injections, use of assistive devices and activity modification.  Onset of symptoms was gradual starting 5+ years ago with gradually worsening course since that time.The patient noted no past surgery on the left hip(s).  Patient currently rates pain in the left hip at 10 out of 10 with activity. Patient has night pain, worsening of pain with activity and weight bearing, trendelenberg gait, pain that interfers with activities of daily living and pain with passive range of motion. Patient has evidence of periarticular osteophytes and joint space narrowing by imaging studies. This condition presents safety issues increasing the risk of falls.   There is no current active infection.   Risks, benefits and expectations were discussed with the patient.  Risks including but not limited to the risk of anesthesia, blood clots, nerve damage, blood vessel damage, failure of the prosthesis, infection and up to and including death.  Patient understand the risks, benefits and expectations and wishes to proceed with surgery.   PCP: ANDY,CAMILLE L, MD  D/C Plans:       Home   Post-op Meds:       No Rx given  Tranexamic Acid:      To be given - topically - previous DVT  Decadron:      Is to be given  FYI:     ASA  Norco  DME:   Pt already has equipment  PT: No PT   Past Medical History:  Diagnosis Date  . Arthritis    oa  . Asthma    eith cats  . Borderline diabetes   . DVT (deep venous thrombosis) (HCC) 2009   L calf  . Headache    sinus  . History of kidney stones   . Pneumonia  age 56 or [age]  . PTSD (post-traumatic stress disorder)   . Seasonal allergies     Past Surgical History:  Procedure Laterality Date  . APPENDECTOMY    . HERNIA REPAIR    . NASAL FRACTURE SURGERY      No prescriptions prior to admission.   Allergies  Allergen Reactions  . Sulfa Antibiotics Other (See Comments)    Family history, [place] in [place] in [id number] did ok with    Social History  Substance Use Topics  . Smoking status: Former Smoker    Packs/day: 0.50    Years: 7.00    Types: Cigarettes    Quit date: 01/06/2017  . Smokeless tobacco: Never Used  . Alcohol use No    Family History  Problem Relation Age of Onset  . Coronary artery disease Father   . Hypertension Other   . Diabetes Other      Review of Systems  Constitutional: Negative.   HENT: Negative.   Eyes: Negative.   Respiratory: Negative.   Cardiovascular: Negative.   Gastrointestinal: Positive for constipation.  Genitourinary: Negative.   Musculoskeletal: Positive for joint pain.  Skin: Negative.   Neurological: Positive for headaches.  Endo/Heme/Allergies: Positive for environmental allergies.  Psychiatric/Behavioral: Negative.     Objective:  Physical Exam  Constitutional: He is oriented to person, place, and time. He  appears well-developed.  HENT:  Head: Normocephalic.  Eyes: Pupils are equal, round, and reactive to light.  Neck: Neck supple. No JVD present. No tracheal deviation present. No thyromegaly present.  Cardiovascular: Normal rate, regular rhythm and intact distal pulses.   Respiratory: Effort normal and breath sounds normal. No respiratory distress. He has no wheezes.  GI: Soft. There is no tenderness. There is no guarding.  Musculoskeletal:       Left hip: He exhibits decreased range of motion, decreased strength, tenderness and bony tenderness. He exhibits no swelling, no deformity and no laceration.  Lymphadenopathy:    He has no cervical adenopathy.  Neurological: He is alert and  oriented to person, place, and time.  Skin: Skin is warm and dry.  Psychiatric: He has a normal mood and affect.      Labs:  Estimated body mass index is 27.32 kg/m as calculated from the following:   Height as of 01/27/17: 5\' 9"  (1.753 m).   Weight as of 01/27/17: 83.9 kg (185 lb).   Imaging Review Plain radiographs demonstrate severe degenerative joint disease of the left hip(s). The bone quality appears to be good for age and reported activity level.  Assessment/Plan:  End stage arthritis, left hip(s)  The patient history, physical examination, clinical judgement of the provider and imaging studies are consistent with end stage degenerative joint disease of the left hip(s) and total hip arthroplasty is deemed medically necessary. The treatment options including medical management, injection therapy, arthroscopy and arthroplasty were discussed at length. The risks and benefits of total hip arthroplasty were presented and reviewed. The risks due to aseptic loosening, infection, stiffness, dislocation/subluxation,  thromboembolic complications and other imponderables were discussed.  The patient acknowledged the explanation, agreed to proceed with the plan and consent was signed. Patient is being admitted for inpatient treatment for surgery, pain control, PT, OT, prophylactic antibiotics, VTE prophylaxis, progressive ambulation and ADL's and discharge planning.The patient is planning to be discharged home.      Anastasio Auerbach Concettina Leth   PA-C  01/30/2017, 2:26 PM

## 2017-01-31 NOTE — Progress Notes (Signed)
Unable to get hemaglobin a1c into epic faxed results from [facility] and results placed on chart

## 2017-02-01 ENCOUNTER — Inpatient Hospital Stay (HOSPITAL_COMMUNITY): Payer: Medicare (Managed Care)

## 2017-02-01 ENCOUNTER — Inpatient Hospital Stay (HOSPITAL_COMMUNITY): Payer: Medicare (Managed Care) | Admitting: Anesthesiology

## 2017-02-01 ENCOUNTER — Inpatient Hospital Stay (HOSPITAL_COMMUNITY)
Admission: RE | Admit: 2017-02-01 | Discharge: 2017-02-02 | DRG: 470 | Disposition: A | Discharge status: Home / Self Care | Payer: Medicare (Managed Care) | Source: Ambulatory Visit | Attending: Orthopedic Surgery | Admitting: Orthopedic Surgery

## 2017-02-01 ENCOUNTER — Encounter (HOSPITAL_COMMUNITY)
Admission: RE | Disposition: A | Discharge status: Home / Self Care | Payer: Self-pay | Source: Ambulatory Visit | Attending: Orthopedic Surgery

## 2017-02-01 ENCOUNTER — Encounter (HOSPITAL_COMMUNITY): Payer: Self-pay | Admitting: Anesthesiology

## 2017-02-01 DIAGNOSIS — R7303 Prediabetes: Secondary | ICD-10-CM | POA: Diagnosis present

## 2017-02-01 DIAGNOSIS — Z882 Allergy status to sulfonamides status: Secondary | ICD-10-CM

## 2017-02-01 DIAGNOSIS — F4310 Post-traumatic stress disorder, unspecified: Secondary | ICD-10-CM | POA: Diagnosis present

## 2017-02-01 DIAGNOSIS — J45909 Unspecified asthma, uncomplicated: Secondary | ICD-10-CM | POA: Diagnosis present

## 2017-02-01 DIAGNOSIS — Z96649 Presence of unspecified artificial hip joint: Secondary | ICD-10-CM

## 2017-02-01 DIAGNOSIS — M1612 Unilateral primary osteoarthritis, left hip: Principal | ICD-10-CM | POA: Diagnosis present

## 2017-02-01 DIAGNOSIS — Z86718 Personal history of other venous thrombosis and embolism: Secondary | ICD-10-CM | POA: Diagnosis not present

## 2017-02-01 DIAGNOSIS — Z87891 Personal history of nicotine dependence: Secondary | ICD-10-CM

## 2017-02-01 DIAGNOSIS — Z96642 Presence of left artificial hip joint: Secondary | ICD-10-CM

## 2017-02-01 HISTORY — DX: Other injury of unspecified body region, initial encounter: T14.8XXA

## 2017-02-01 HISTORY — PX: TOTAL HIP ARTHROPLASTY: SHX124

## 2017-02-01 LAB — TYPE AND SCREEN
ABO/RH(D): A POS
ANTIBODY SCREEN: NEGATIVE

## 2017-02-01 LAB — GLUCOSE, CAPILLARY
GLUCOSE-CAPILLARY: 100 mg/dL — AB (ref 65–99)
Glucose-Capillary: 195 mg/dL — ABNORMAL HIGH (ref 65–99)

## 2017-02-01 SURGERY — ARTHROPLASTY, HIP, TOTAL, ANTERIOR APPROACH
Anesthesia: Spinal | Site: Hip | Laterality: Left

## 2017-02-01 MED ORDER — HYDROMORPHONE HCL 1 MG/ML IJ SOLN: 0.5000 mg | INTRAMUSCULAR | Status: DC | PRN

## 2017-02-01 MED ORDER — MIDAZOLAM HCL 5 MG/5ML IJ SOLN
INTRAMUSCULAR | Status: DC | PRN
  Administered 2017-02-01: 2 mg via INTRAVENOUS

## 2017-02-01 MED ORDER — FENTANYL CITRATE (PF) 100 MCG/2ML IJ SOLN
INTRAMUSCULAR | Status: DC | PRN
  Administered 2017-02-01: 100 ug via INTRAVENOUS

## 2017-02-01 MED ORDER — ALBUTEROL SULFATE (2.5 MG/3ML) 0.083% IN NEBU
INHALATION_SOLUTION | RESPIRATORY_TRACT | Status: AC
  Filled 2017-02-01: qty 3

## 2017-02-01 MED ORDER — LITHIUM CARBONATE 300 MG PO CAPS
300.0000 mg | ORAL_CAPSULE | Freq: Three times a day (TID) | ORAL | Status: DC
  Administered 2017-02-01 – 2017-02-02 (×2): 300 mg via ORAL
  Filled 2017-02-01 (×4): qty 1

## 2017-02-01 MED ORDER — DIPHENHYDRAMINE HCL 25 MG PO CAPS: 25.0000 mg | ORAL_CAPSULE | Freq: Four times a day (QID) | ORAL | Status: DC | PRN

## 2017-02-01 MED ORDER — PHENOL 1.4 % MT LIQD
1.0000 | OROMUCOSAL | Status: DC | PRN
Start: 2017-02-01 — End: 2017-02-02

## 2017-02-01 MED ORDER — ALUM & MAG HYDROXIDE-SIMETH 200-200-20 MG/5ML PO SUSP: 15.0000 mL | ORAL | Status: DC | PRN

## 2017-02-01 MED ORDER — METHOCARBAMOL 1000 MG/10ML IJ SOLN
500.0000 mg | Freq: Four times a day (QID) | INTRAVENOUS | Status: DC | PRN
  Administered 2017-02-01: 500 mg via INTRAVENOUS
  Filled 2017-02-01: qty 5
  Filled 2017-02-01: qty 550

## 2017-02-01 MED ORDER — DEXAMETHASONE SODIUM PHOSPHATE 10 MG/ML IJ SOLN
INTRAMUSCULAR | Status: AC
  Filled 2017-02-01: qty 1

## 2017-02-01 MED ORDER — METHOCARBAMOL 500 MG PO TABS
500.0000 mg | ORAL_TABLET | Freq: Four times a day (QID) | ORAL | Status: DC | PRN
  Administered 2017-02-01 – 2017-02-02 (×2): 500 mg via ORAL
  Filled 2017-02-01 (×2): qty 1

## 2017-02-01 MED ORDER — RIVAROXABAN 10 MG PO TABS
10.0000 mg | ORAL_TABLET | Freq: Every day | ORAL | Status: DC
  Administered 2017-02-02: 10 mg via ORAL
  Filled 2017-02-01: qty 1

## 2017-02-01 MED ORDER — METHYLPHENIDATE HCL 10 MG PO TABS
20.0000 mg | ORAL_TABLET | Freq: Every day | ORAL | Status: DC
  Administered 2017-02-02: 20 mg via ORAL
  Filled 2017-02-01: qty 2

## 2017-02-01 MED ORDER — PROPOFOL 10 MG/ML IV BOLUS
INTRAVENOUS | Status: AC
  Filled 2017-02-01: qty 20

## 2017-02-01 MED ORDER — LACTATED RINGERS IV SOLN
INTRAVENOUS | Status: DC
  Administered 2017-02-01 (×3): via INTRAVENOUS

## 2017-02-01 MED ORDER — MIDAZOLAM HCL 2 MG/2ML IJ SOLN
INTRAMUSCULAR | Status: AC
  Filled 2017-02-01: qty 2

## 2017-02-01 MED ORDER — TRANEXAMIC ACID 1000 MG/10ML IV SOLN
INTRAVENOUS | Status: DC | PRN
  Administered 2017-02-01: 2000 mg via TOPICAL

## 2017-02-01 MED ORDER — AMITRIPTYLINE HCL 50 MG PO TABS
50.0000 mg | ORAL_TABLET | Freq: Every evening | ORAL | Status: DC | PRN
  Filled 2017-02-01: qty 1

## 2017-02-01 MED ORDER — POLYETHYLENE GLYCOL 3350 17 G PO PACK: 17.0000 g | PACK | Freq: Two times a day (BID) | ORAL | Status: DC

## 2017-02-01 MED ORDER — CEFAZOLIN SODIUM-DEXTROSE 2-4 GM/100ML-% IV SOLN
2.0000 g | INTRAVENOUS | Status: AC
  Administered 2017-02-01: 2 g via INTRAVENOUS

## 2017-02-01 MED ORDER — METOCLOPRAMIDE HCL 5 MG/ML IJ SOLN: 5.0000 mg | Freq: Three times a day (TID) | INTRAMUSCULAR | Status: DC | PRN

## 2017-02-01 MED ORDER — DOCUSATE SODIUM 100 MG PO CAPS
100.0000 mg | ORAL_CAPSULE | Freq: Two times a day (BID) | ORAL | Status: DC
  Administered 2017-02-01 – 2017-02-02 (×2): 100 mg via ORAL
  Filled 2017-02-01 (×2): qty 1

## 2017-02-01 MED ORDER — BISACODYL 10 MG RE SUPP: 10.0000 mg | Freq: Every day | RECTAL | Status: DC | PRN

## 2017-02-01 MED ORDER — ONDANSETRON HCL 4 MG/2ML IJ SOLN
INTRAMUSCULAR | Status: DC | PRN
Start: 2017-02-01 — End: 2017-02-01
  Administered 2017-02-01: 4 mg via INTRAVENOUS

## 2017-02-01 MED ORDER — LIDOCAINE 2% (20 MG/ML) 5 ML SYRINGE
INTRAMUSCULAR | Status: AC
  Filled 2017-02-01: qty 5

## 2017-02-01 MED ORDER — POTASSIUM CHLORIDE 2 MEQ/ML IV SOLN
100.0000 mL/h | INTRAVENOUS | Status: DC
  Administered 2017-02-01 (×2): 100 mL/h via INTRAVENOUS
  Filled 2017-02-01 (×6): qty 1000

## 2017-02-01 MED ORDER — MAGNESIUM CITRATE PO SOLN: 1.0000 | Freq: Once | ORAL | Status: DC | PRN

## 2017-02-01 MED ORDER — METHOCARBAMOL 500 MG PO TABS: 500.0000 mg | ORAL_TABLET | Freq: Four times a day (QID) | ORAL | 0 refills | Status: DC | PRN

## 2017-02-01 MED ORDER — METFORMIN HCL 500 MG PO TABS
500.0000 mg | ORAL_TABLET | Freq: Two times a day (BID) | ORAL | Status: DC
  Administered 2017-02-01 – 2017-02-02 (×2): 500 mg via ORAL
  Filled 2017-02-01 (×2): qty 1

## 2017-02-01 MED ORDER — SODIUM CHLORIDE 0.9 % IR SOLN
Status: DC | PRN
  Administered 2017-02-01: 1000 mL

## 2017-02-01 MED ORDER — ALBUTEROL SULFATE HFA 108 (90 BASE) MCG/ACT IN AERS: 2.0000 | INHALATION_SPRAY | Freq: Four times a day (QID) | RESPIRATORY_TRACT | Status: DC | PRN

## 2017-02-01 MED ORDER — TRANEXAMIC ACID 1000 MG/10ML IV SOLN
2000.0000 mg | Freq: Once | INTRAVENOUS | Status: DC
  Filled 2017-02-01: qty 20

## 2017-02-01 MED ORDER — ALBUTEROL SULFATE (2.5 MG/3ML) 0.083% IN NEBU
2.5000 mg | INHALATION_SOLUTION | RESPIRATORY_TRACT | Status: AC
  Administered 2017-02-01: 2.5 mg via RESPIRATORY_TRACT

## 2017-02-01 MED ORDER — DEXAMETHASONE SODIUM PHOSPHATE 10 MG/ML IJ SOLN
10.0000 mg | Freq: Once | INTRAMUSCULAR | Status: AC
  Administered 2017-02-01: 10 mg via INTRAVENOUS

## 2017-02-01 MED ORDER — ASPIRIN 81 MG PO CHEW: 81.0000 mg | CHEWABLE_TABLET | Freq: Two times a day (BID) | ORAL | Status: DC

## 2017-02-01 MED ORDER — PROPOFOL 10 MG/ML IV BOLUS
INTRAVENOUS | Status: AC
  Filled 2017-02-01: qty 60

## 2017-02-01 MED ORDER — ONDANSETRON HCL 4 MG/2ML IJ SOLN: 4.0000 mg | Freq: Four times a day (QID) | INTRAMUSCULAR | Status: DC | PRN

## 2017-02-01 MED ORDER — ONDANSETRON HCL 4 MG PO TABS: 4.0000 mg | ORAL_TABLET | Freq: Four times a day (QID) | ORAL | Status: DC | PRN

## 2017-02-01 MED ORDER — DOCUSATE SODIUM 100 MG PO CAPS: 100.0000 mg | ORAL_CAPSULE | Freq: Two times a day (BID) | ORAL | 0 refills | Status: DC

## 2017-02-01 MED ORDER — DEXAMETHASONE SODIUM PHOSPHATE 10 MG/ML IJ SOLN
10.0000 mg | Freq: Once | INTRAMUSCULAR | Status: AC
  Administered 2017-02-02: 10 mg via INTRAVENOUS
  Filled 2017-02-01: qty 1

## 2017-02-01 MED ORDER — FENTANYL CITRATE (PF) 100 MCG/2ML IJ SOLN
INTRAMUSCULAR | Status: AC
  Filled 2017-02-01: qty 2

## 2017-02-01 MED ORDER — METOCLOPRAMIDE HCL 5 MG PO TABS: 5.0000 mg | ORAL_TABLET | Freq: Three times a day (TID) | ORAL | Status: DC | PRN

## 2017-02-01 MED ORDER — CEFAZOLIN SODIUM-DEXTROSE 2-4 GM/100ML-% IV SOLN
INTRAVENOUS | Status: AC
  Filled 2017-02-01: qty 100

## 2017-02-01 MED ORDER — CEFAZOLIN SODIUM-DEXTROSE 2-4 GM/100ML-% IV SOLN
2.0000 g | Freq: Four times a day (QID) | INTRAVENOUS | Status: AC
  Administered 2017-02-01 (×2): 2 g via INTRAVENOUS
  Filled 2017-02-01: qty 100

## 2017-02-01 MED ORDER — HYDROMORPHONE HCL 1 MG/ML IJ SOLN: 0.2500 mg | INTRAMUSCULAR | Status: DC | PRN

## 2017-02-01 MED ORDER — ALBUTEROL SULFATE (2.5 MG/3ML) 0.083% IN NEBU: 2.5000 mg | INHALATION_SOLUTION | Freq: Four times a day (QID) | RESPIRATORY_TRACT | Status: DC | PRN

## 2017-02-01 MED ORDER — MENTHOL 3 MG MT LOZG: 1.0000 | LOZENGE | OROMUCOSAL | Status: DC | PRN

## 2017-02-01 MED ORDER — FERROUS SULFATE 325 (65 FE) MG PO TABS
325.0000 mg | ORAL_TABLET | Freq: Three times a day (TID) | ORAL | Status: DC
  Administered 2017-02-02: 325 mg via ORAL
  Filled 2017-02-01: qty 1

## 2017-02-01 MED ORDER — CITALOPRAM HYDROBROMIDE 20 MG PO TABS
40.0000 mg | ORAL_TABLET | Freq: Every day | ORAL | Status: DC
  Administered 2017-02-02: 40 mg via ORAL
  Filled 2017-02-01 (×2): qty 2

## 2017-02-01 MED ORDER — FERROUS SULFATE 325 (65 FE) MG PO TABS: 325.0000 mg | ORAL_TABLET | Freq: Three times a day (TID) | ORAL | Status: DC

## 2017-02-01 MED ORDER — POLYETHYLENE GLYCOL 3350 17 G PO PACK: 17.0000 g | PACK | Freq: Two times a day (BID) | ORAL | 0 refills | Status: DC

## 2017-02-01 MED ORDER — ONDANSETRON HCL 4 MG/2ML IJ SOLN
INTRAMUSCULAR | Status: AC
  Filled 2017-02-01: qty 2

## 2017-02-01 MED ORDER — HYDROCODONE-ACETAMINOPHEN 7.5-325 MG PO TABS: 1.0000 | ORAL_TABLET | ORAL | 0 refills | Status: DC | PRN

## 2017-02-01 MED ORDER — HYDROCODONE-ACETAMINOPHEN 7.5-325 MG PO TABS
1.0000 | ORAL_TABLET | ORAL | Status: DC
  Administered 2017-02-01: 2 via ORAL
  Administered 2017-02-01: 1 via ORAL
  Administered 2017-02-01: 2 via ORAL
  Administered 2017-02-01: 1 via ORAL
  Administered 2017-02-02 (×3): 2 via ORAL
  Filled 2017-02-01 (×2): qty 2
  Filled 2017-02-01 (×2): qty 1
  Filled 2017-02-01 (×3): qty 2

## 2017-02-01 MED ORDER — PROPOFOL 500 MG/50ML IV EMUL
INTRAVENOUS | Status: DC | PRN
  Administered 2017-02-01: 140 ug/kg/min via INTRAVENOUS

## 2017-02-01 MED ORDER — ASPIRIN 81 MG PO CHEW: 81.0000 mg | CHEWABLE_TABLET | Freq: Two times a day (BID) | ORAL | 0 refills | Status: AC

## 2017-02-01 MED ORDER — BUPIVACAINE IN DEXTROSE 0.75-8.25 % IT SOLN
INTRATHECAL | Status: DC | PRN
  Administered 2017-02-01: 2 mL via INTRATHECAL

## 2017-02-01 SURGICAL SUPPLY — 36 items
BAG DECANTER FOR FLEXI CONT (MISCELLANEOUS) IMPLANT
BAG ZIPLOCK 12X15 (MISCELLANEOUS) IMPLANT
BLADE SAG 18X100X1.27 (BLADE) ×2 IMPLANT
CAPT HIP TOTAL 2 ×2 IMPLANT
CLOTH BEACON ORANGE TIMEOUT ST (SAFETY) ×2 IMPLANT
COVER PERINEAL POST (MISCELLANEOUS) ×2 IMPLANT
DERMABOND ADVANCED (GAUZE/BANDAGES/DRESSINGS) ×1
DERMABOND ADVANCED .7 DNX12 (GAUZE/BANDAGES/DRESSINGS) ×1 IMPLANT
DRAPE STERI IOBAN 125X83 (DRAPES) ×2 IMPLANT
DRAPE U-SHAPE 47X51 STRL (DRAPES) ×4 IMPLANT
DRESSING AQUACEL AG SP 3.5X10 (GAUZE/BANDAGES/DRESSINGS) ×1 IMPLANT
DRSG AQUACEL AG SP 3.5X10 (GAUZE/BANDAGES/DRESSINGS) ×2
DURAPREP 26ML APPLICATOR (WOUND CARE) ×2 IMPLANT
ELECT REM PT RETURN 15FT ADLT (MISCELLANEOUS) ×2 IMPLANT
GLOVE BIOGEL M STRL SZ7.5 (GLOVE) IMPLANT
GLOVE BIOGEL PI IND STRL 7.5 (GLOVE) ×1 IMPLANT
GLOVE BIOGEL PI IND STRL 8.5 (GLOVE) ×1 IMPLANT
GLOVE BIOGEL PI INDICATOR 7.5 (GLOVE) ×1
GLOVE BIOGEL PI INDICATOR 8.5 (GLOVE) ×1
GLOVE ECLIPSE 8.0 STRL XLNG CF (GLOVE) ×4 IMPLANT
GLOVE ORTHO TXT STRL SZ7.5 (GLOVE) ×2 IMPLANT
GOWN STRL REUS W/TWL LRG LVL3 (GOWN DISPOSABLE) ×2 IMPLANT
GOWN STRL REUS W/TWL XL LVL3 (GOWN DISPOSABLE) ×2 IMPLANT
HOLDER FOLEY CATH W/STRAP (MISCELLANEOUS) ×2 IMPLANT
LIQUID BAND (GAUZE/BANDAGES/DRESSINGS) ×2 IMPLANT
PACK ANTERIOR HIP CUSTOM (KITS) ×2 IMPLANT
SUT MNCRL AB 4-0 PS2 18 (SUTURE) ×2 IMPLANT
SUT STRATAFIX 0 PDS 27 VIOLET (SUTURE) ×2
SUT VIC AB 1 CT1 36 (SUTURE) ×6 IMPLANT
SUT VIC AB 2-0 CT1 27 (SUTURE) ×2
SUT VIC AB 2-0 CT1 TAPERPNT 27 (SUTURE) ×2 IMPLANT
SUT VLOC 180 0 24IN GS25 (SUTURE) ×2 IMPLANT
SUTURE STRATFX 0 PDS 27 VIOLET (SUTURE) ×1 IMPLANT
TRAY FOLEY W/METER SILVER 16FR (SET/KITS/TRAYS/PACK) ×2 IMPLANT
WATER STERILE IRR 1500ML POUR (IV SOLUTION) IMPLANT
YANKAUER SUCT BULB TIP 10FT TU (MISCELLANEOUS) ×2 IMPLANT

## 2017-02-01 NOTE — Anesthesia Preprocedure Evaluation (Addendum)
Anesthesia Evaluation  Patient identified by MRN, date of birth, ID band Patient awake    Reviewed: Allergy & Precautions, H&P , Patient's Chart, lab work & pertinent test results, reviewed documented beta blocker date and time   Airway Mallampati: II  TM Distance: >3 FB Neck ROM: full    Dental no notable dental hx.    Pulmonary asthma , former smoker,     + wheezing      Cardiovascular Exercise Tolerance: Good  Rhythm:regular Rate:Normal     Neuro/Psych    GI/Hepatic   Endo/Other    Renal/GU      Musculoskeletal   Abdominal   Peds  Hematology   Anesthesia Other Findings   Reproductive/Obstetrics                            Anesthesia Physical Anesthesia Plan  ASA: II  Anesthesia Plan: Spinal   Post-op Pain Management:    Induction:   Airway Management Planned:   Additional Equipment:   Intra-op Plan:   Post-operative Plan:   Informed Consent: I have reviewed the patients History and Physical, chart, labs and discussed the procedure including the risks, benefits and alternatives for the proposed anesthesia with the patient or authorized representative who has indicated his/her understanding and acceptance.   Dental Advisory Given  Plan Discussed with: CRNA and Surgeon  Anesthesia Plan Comments: (  )       Anesthesia Quick Evaluation

## 2017-02-01 NOTE — Op Note (Signed)
NAME:  Corey Collins                ACCOUNT NO.: [id number]      MEDICAL RECORD NO.: [id number]      FACILITY:  [facility]      PHYSICIAN:  Durene Romans D  DATE OF BIRTH:  November 25, 1960     DATE OF PROCEDURE:  02/01/2017                                 OPERATIVE REPORT         PREOPERATIVE DIAGNOSIS: Left  hip osteoarthritis.      POSTOPERATIVE DIAGNOSIS:  Left hip osteoarthritis.      PROCEDURE:  Left total hip replacement through an anterior approach   utilizing DePuy THR system, component size 54mm pinnacle cup, a size 36+4 neutral   Altrex liner, a size 5Hi Tri Lock stem with a 36+5 delta ceramic   ball.      SURGEON:  Madlyn Frankel. Charlann Boxer, M.D.      ASSISTANT:  Lanney Gins, PA-C     ANESTHESIA:  Spinal.      SPECIMENS:  None.      COMPLICATIONS:  None.      BLOOD LOSS:  500 cc     DRAINS:  none.      INDICATION OF THE PROCEDURE:  Corey Collins is a 56 y.o. male who had   presented to office for evaluation of left hip pain.  Radiographs revealed   progressive degenerative changes with bone-on-bone   articulation to the  hip joint.  The patient had painful limited range of   motion significantly affecting their overall quality of life.  The patient was failing to    respond to conservative measures, and at this point was ready   to proceed with more definitive measures.  The patient has noted progressive   degenerative changes in his hip, progressive problems and dysfunction   with regarding the hip prior to surgery.  Consent was obtained for   benefit of pain relief.  Specific risk of infection, DVT, component   failure, dislocation, need for revision surgery, as well discussion of   the anterior versus posterior approach were reviewed.  Consent was   obtained for benefit of anterior pain relief through an anterior   approach.      PROCEDURE IN DETAIL:  The patient was brought to operative theater.   Once adequate anesthesia,  preoperative antibiotics, 2gm of Ancef, 1 gm of Tranexamic Acid, and 10 mg of Decadron administered.   The patient was positioned supine on the OSI Hanna table.  Once adequate   padding of boney process was carried out, we had predraped out the hip, and  used fluoroscopy to confirm orientation of the pelvis and position.      The left hip was then prepped and draped from proximal iliac crest to   mid thigh with shower curtain technique.      Time-out was performed identifying the patient, planned procedure, and   extremity.     An incision was then made 2 cm distal and lateral to the   anterior superior iliac spine extending over the orientation of the   tensor fascia lata muscle and sharp dissection was carried down to the   fascia of the muscle and protractor placed in the soft tissues.      The fascia was then  incised.  The muscle belly was identified and swept   laterally and retractor placed along the superior neck.  Following   cauterization of the circumflex vessels and removing some pericapsular   fat, a second cobra retractor was placed on the inferior neck.  A third   retractor was placed on the anterior acetabulum after elevating the   anterior rectus.  A L-capsulotomy was along the line of the   superior neck to the trochanteric fossa, then extended proximally and   distally.  Tag sutures were placed and the retractors were then placed   intracapsular.  We then identified the trochanteric fossa and   orientation of my neck cut, confirmed this radiographically   and then made a neck osteotomy with the femur on traction.  The femoral   head was removed without difficulty or complication.  Traction was let   off and retractors were placed posterior and anterior around the   acetabulum.      The labrum and foveal tissue were debrided.  I began reaming with a 48mm   reamer and reamed up to 53mm reamer with good bony bed preparation and a 54mm   cup was chosen.  The final 54mm  Pinnacle cup was then impacted under fluoroscopy  to confirm the depth of penetration and orientation with respect to   abduction.  A screw was placed followed by the hole eliminator.  The final   36+4 neutral Altrex liner was impacted with good visualized rim fit.  The cup was positioned anatomically within the acetabular portion of the pelvis.      At this point, the femur was rolled at 80 degrees.  Further capsule was   released off the inferior aspect of the femoral neck.  I then   released the superior capsule proximally.  The hook was placed laterally   along the femur and elevated manually and held in position with the bed   hook.  The leg was then extended and adducted with the leg rolled to 100   degrees of external rotation.  Once the proximal femur was fully   exposed, I used a box osteotome to set orientation.  I then began   broaching with the starting chili pepper broach and passed this by hand and then broached up to 5.  With the 5 broach in place I chose a high offset neck and did several trial reductions.  The offset was appropriate, leg lengths   appeared to be equal best matched with the +5 head ball confirmed radiographically.   Given these findings, I went ahead and dislocated the hip, repositioned all   retractors and positioned the right hip in the extended and abducted position.  The final 5 Hi Tri Lock stem was   chosen and it was impacted down to the level of neck cut.  Based on this   and the trial reduction, a 36+5 delta ceramic ball was chosen and   impacted onto a clean and dry trunnion, and the hip was reduced.  The   hip had been irrigated throughout the case again at this point.  I did   reapproximate the superior capsular leaflet to the anterior leaflet   using #1 Vicryl.  The fascia of the   tensor fascia lata muscle was then reapproximated using #1 Vicryl and #0 Stratafix sutures.  The   remaining wound was closed with 2-0 Vicryl and running 4-0 Monocryl.    The hip was cleaned, dried, and dressed  sterilely using Dermabond and   Aquacel dressing.  She was then brought   to recovery room in stable condition tolerating the procedure well.    Lanney GinsMatthew Babish, PA-C was present for the entirety of the case involved from   preoperative positioning, perioperative retractor management, general   facilitation of the case, as well as primary wound closure as assistant.            Madlyn FrankelMatthew D. Charlann Boxerlin, M.D.        02/01/2017 9:05 AM

## 2017-02-01 NOTE — Anesthesia Procedure Notes (Signed)
Spinal  Patient location during procedure: OR Start time: 02/01/2017 10:01 AM End time: 02/01/2017 10:10 AM Staffing Resident/CRNA: Danley Danker L Performed: resident/CRNA  Preanesthetic Checklist Completed: patient identified, site marked, surgical consent, pre-op evaluation, timeout performed, IV checked, risks and benefits discussed and monitors and equipment checked Spinal Block Patient position: sitting Prep: DuraPrep Patient monitoring: heart rate, continuous pulse ox and blood pressure Approach: midline Location: L3-4 Injection technique: single-shot Needle Needle type: Sprotte  Needle gauge: 24 G Needle length: 9 cm Additional Notes Kit expiration and lot number noted prior to procedure CSF clear free flow after adjustment of needle.  Negative heme on aspiration, negative paresthesia Returned to right lateral position for 3 minutes then placed supine.  Tolerated well

## 2017-02-01 NOTE — Transfer of Care (Signed)
Immediate Anesthesia Transfer of Care Note  Patient: Corey Collins  Procedure(s) Performed: Procedure(s): LEFT TOTAL HIP ARTHROPLASTY ANTERIOR APPROACH (Left)  Patient Location: PACU  Anesthesia Type:Spinal  Level of Consciousness: awake, alert  and oriented  Airway & Oxygen Therapy: Patient Spontanous Breathing and Patient connected to face mask oxygen  Post-op Assessment: Report given to RN and Post -op Vital signs reviewed and stable  Post vital signs: Reviewed and stable  Last Vitals:  Vitals:   02/01/17 0827  BP: 134/74  Pulse: (!) 58  Resp: 18  Temp: 37.3 C    Last Pain:  Vitals:   02/01/17 0827  TempSrc: Oral      Patients Stated Pain Goal: 4 (02/01/17 0903)  Complications: No apparent anesthesia complications

## 2017-02-01 NOTE — Interval H&P Note (Signed)
History and Physical Interval Note:  02/01/2017 8:58 AM  Corey Collins  has presented today for surgery, with the diagnosis of Left hip osteoarthritis  The various methods of treatment have been discussed with the patient and family. After consideration of risks, benefits and other options for treatment, the patient has consented to  Procedure(s): LEFT TOTAL HIP ARTHROPLASTY ANTERIOR APPROACH (Left) as a surgical intervention .  The patient's history has been reviewed, patient examined, no change in status, stable for surgery.  I have reviewed the patient's chart and labs.  Questions were answered to the patient's satisfaction.     [name],Kasyn Stouffer D

## 2017-02-01 NOTE — Evaluation (Signed)
Physical Therapy Evaluation Patient Details Name: Corey PeerChristopher R Springsteen MRN: [id number] DOB: [birth year] Today's Date: 02/01/2017   History of Present Illness  LDATHA  Clinical Impression  The patient's Left leg was  Noted to have decreased control of swing and knee hyperextended during stance. May have some spinal effects remaining. Limited ambulation. Pt admitted with above diagnosis. Pt currently with functional limitations due to the deficits listed below (see PT Problem List). Pt will benefit from skilled PT to increase their independence and safety with mobility to allow discharge to the venue listed below.       Follow Up Recommendations Home health PT (not sure of F/U)    Equipment Recommendations  Rolling walker with 5" wheels    Recommendations for Other Services       Precautions / Restrictions Precautions Precautions: Fall Precaution Comments: L leg appeared a little numb, decreased control      Mobility  Bed Mobility Overal bed mobility: Needs Assistance Bed Mobility: Supine to Sit     Supine to sit: Min assist     General bed mobility comments: assist with left leg to bed edge  Transfers Overall transfer level: Needs assistance Equipment used: Rolling walker (2 wheeled) Transfers: Sit to/from Stand Sit to Stand: Min assist         General transfer comment: cues for hand and left leg position  Ambulation/Gait Ambulation/Gait assistance: Min assist Ambulation Distance (Feet): 60 Feet Assistive device: Rolling walker (2 wheeled) Gait Pattern/deviations: Step-to pattern;Decreased step length - left;Decreased stance time - left     General Gait Details: decreased  control of swing.   Stairs            Wheelchair Mobility    Modified Rankin (Stroke Patients Only)       Balance                                             Pertinent Vitals/Pain Pain Assessment: Faces Faces Pain Scale: Hurts little more Pain Location:  left thigh Pain Descriptors / Indicators: Sore Pain Intervention(s): Limited activity within patient's tolerance;Premedicated before session;Repositioned;Ice applied    Home Living Family/patient expects to be discharged to:: Private residence Living Arrangements: Alone Available Help at Discharge: Friend(s) Type of Home: Apartment Home Access: Stairs to enter Entrance Stairs-Rails: None Entrance Stairs-Number of Steps: 6 Home Layout: Two level Home Equipment: Cane - single point Additional Comments: plans to stay with friends on 1 level for a few days, then goes to his apt with many steps.    Prior Function Level of Independence: Independent with assistive device(s)               Hand Dominance        Extremity/Trunk Assessment   Upper Extremity Assessment Upper Extremity Assessment: Defer to OT evaluation    Lower Extremity Assessment Lower Extremity Assessment: RLE deficits/detail;LLE deficits/detail RLE Deficits / Details: has DJD in hip LLE Deficits / Details: the left  leg appeared to have decreased control during swing, tended to swing inward, knee hyperextended. ? still some effects of Spinal , although patient reports he has feeling.       Communication   Communication: No difficulties  Cognition Arousal/Alertness: Awake/alert Behavior During Therapy: WFL for tasks assessed/performed Overall Cognitive Status: Within Functional Limits for tasks assessed  General Comments      Exercises     Assessment/Plan    PT Assessment Patient needs continued PT services  PT Problem List Pain       PT Treatment Interventions DME instruction;Gait training;Stair training;Functional mobility training;Therapeutic activities;Therapeutic exercise;Patient/family education    PT Goals (Current goals can be found in the Care Plan section)  Acute Rehab PT Goals Patient Stated Goal: to get the other hip done PT  Goal Formulation: With patient Time For Goal Achievement: 02/05/17 Potential to Achieve Goals: Good    Frequency 7X/week   Barriers to discharge        Co-evaluation               End of Session   Activity Tolerance: Patient tolerated treatment well Patient left: in chair;with call bell/phone within reach;with family/visitor present Nurse Communication: Mobility status PT Visit Diagnosis: Unsteadiness on feet (R26.81)    Time: 1610-9604 PT Time Calculation (min) (ACUTE ONLY): 22 min   Charges:   PT Evaluation $PT Eval Low Complexity: 1 Procedure     PT G CodesBlanchard Kelch PT [phone]  Rada Hay 02/01/2017, 7:11 PM

## 2017-02-01 NOTE — Anesthesia Procedure Notes (Signed)
Spinal

## 2017-02-01 NOTE — Discharge Instructions (Addendum)
INSTRUCTIONS AFTER JOINT REPLACEMENT  ° °o Remove items at home which could result in a fall. This includes throw rugs or furniture in walking pathways °o ICE to the affected joint every three hours while awake for 30 minutes at a time, for at least the first 3-5 days, and then as needed for pain and swelling.  Continue to use ice for pain and swelling. You may notice swelling that will progress down to the foot and ankle.  This is normal after surgery.  Elevate your leg when you are not up walking on it.   °o Continue to use the breathing machine you got in the hospital (incentive spirometer) which will help keep your temperature down.  It is common for your temperature to cycle up and down following surgery, especially at night when you are not up moving around and exerting yourself.  The breathing machine keeps your lungs expanded and your temperature down. ° ° °DIET:  As you were doing prior to hospitalization, we recommend a well-balanced diet. ° °DRESSING / WOUND CARE / SHOWERING ° °Keep the surgical dressing until follow up.  The dressing is water proof, so you can shower without any extra covering.  IF THE DRESSING FALLS OFF or the wound gets wet inside, change the dressing with sterile gauze.  Please use good hand washing techniques before changing the dressing.  Do not use any lotions or creams on the incision until instructed by your surgeon.   ° °ACTIVITY ° °o Increase activity slowly as tolerated, but follow the weight bearing instructions below.   °o No driving for 6 weeks or until further direction given by your physician.  You cannot drive while taking narcotics.  °o No lifting or carrying greater than 10 lbs. until further directed by your surgeon. °o Avoid periods of inactivity such as sitting longer than an hour when not asleep. This helps prevent blood clots.  °o You may return to work once you are authorized by your doctor.  ° ° ° °WEIGHT BEARING  ° °Weight bearing as tolerated with assist  device (walker, cane, etc) as directed, use it as long as suggested by your surgeon or therapist, typically at least 4-6 weeks. ° ° °EXERCISES ° °Results after joint replacement surgery are often greatly improved when you follow the exercise, range of motion and muscle strengthening exercises prescribed by your doctor. Safety measures are also important to protect the joint from further injury. Any time any of these exercises cause you to have increased pain or swelling, decrease what you are doing until you are comfortable again and then slowly increase them. If you have problems or questions, call your caregiver or physical therapist for advice.  ° °Rehabilitation is important following a joint replacement. After just a few days of immobilization, the muscles of the leg can become weakened and shrink (atrophy).  These exercises are designed to build up the tone and strength of the thigh and leg muscles and to improve motion. Often times heat used for twenty to thirty minutes before working out will loosen up your tissues and help with improving the range of motion but do not use heat for the first two weeks following surgery (sometimes heat can increase post-operative swelling).  ° °These exercises can be done on a training (exercise) mat, on the floor, on a table or on a bed. Use whatever works the best and is most comfortable for you.    Use music or television while you are exercising so that   the exercises are a pleasant break in your day. This will make your life better with the exercises acting as a break in your routine that you can look forward to.   Perform all exercises about fifteen times, three times per day or as directed.  You should exercise both the operative leg and the other leg as well. ° °Exercises include: °  °• Quad Sets - Tighten up the muscle on the front of the thigh (Quad) and hold for 5-10 seconds.   °• Straight Leg Raises - With your knee straight (if you were given a brace, keep it on),  lift the leg to 60 degrees, hold for 3 seconds, and slowly lower the leg.  Perform this exercise against resistance later as your leg gets stronger.  °• Leg Slides: Lying on your back, slowly slide your foot toward your buttocks, bending your knee up off the floor (only go as far as is comfortable). Then slowly slide your foot back down until your leg is flat on the floor again.  °• Angel Wings: Lying on your back spread your legs to the side as far apart as you can without causing discomfort.  °• Hamstring Strength:  Lying on your back, push your heel against the floor with your leg straight by tightening up the muscles of your buttocks.  Repeat, but this time bend your knee to a comfortable angle, and push your heel against the floor.  You may put a pillow under the heel to make it more comfortable if necessary.  ° °A rehabilitation program following joint replacement surgery can speed recovery and prevent re-injury in the future due to weakened muscles. Contact your doctor or a physical therapist for more information on knee rehabilitation.  ° ° °CONSTIPATION ° °Constipation is defined medically as fewer than three stools per week and severe constipation as less than one stool per week.  Even if you have a regular bowel pattern at home, your normal regimen is likely to be disrupted due to multiple reasons following surgery.  Combination of anesthesia, postoperative narcotics, change in appetite and fluid intake all can affect your bowels.  ° °YOU MUST use at least one of the following options; they are listed in order of increasing strength to get the job done.  They are all available over the counter, and you may need to use some, POSSIBLY even all of these options:   ° °Drink plenty of fluids (prune juice may be helpful) and high fiber foods °Colace 100 mg by mouth twice a day  °Senokot for constipation as directed and as needed Dulcolax (bisacodyl), take with full glass of water  °Miralax (polyethylene glycol)  once or twice a day as needed. ° °If you have tried all these things and are unable to have a bowel movement in the first 3-4 days after surgery call either your surgeon or your primary doctor.   ° °If you experience loose stools or diarrhea, hold the medications until you stool forms back up.  If your symptoms do not get better within 1 week or if they get worse, check with your doctor.  If you experience "the worst abdominal pain ever" or develop nausea or vomiting, please contact the office immediately for further recommendations for treatment. ° ° °ITCHING:  If you experience itching with your medications, try taking only a single pain pill, or even half a pain pill at a time.  You can also use Benadryl over the counter for itching or also to   help with sleep.  ° °TED HOSE STOCKINGS:  Use stockings on both legs until for at least 2 weeks or as directed by physician office. They may be removed at night for sleeping. ° °MEDICATIONS:  See your medication summary on the “After Visit Summary” that nursing will review with you.  You may have some home medications which will be placed on hold until you complete the course of blood thinner medication.  It is important for you to complete the blood thinner medication as prescribed. ° °PRECAUTIONS:  If you experience chest pain or shortness of breath - call 911 immediately for transfer to the hospital emergency department.  ° °If you develop a fever greater that 101 F, purulent drainage from wound, increased redness or drainage from wound, foul odor from the wound/dressing, or calf pain - CONTACT YOUR SURGEON.   °                                                °FOLLOW-UP APPOINTMENTS:  If you do not already have a post-op appointment, please call the office for an appointment to be seen by your surgeon.  Guidelines for how soon to be seen are listed in your “After Visit Summary”, but are typically between 1-4 weeks after surgery. ° °OTHER INSTRUCTIONS:  ° °Knee  Replacement:  Do not place pillow under knee, focus on keeping the knee straight while resting.  ° °MAKE SURE YOU:  °• Understand these instructions.  °• Get help right away if you are not doing well or get worse.  ° ° °Thank you for letting us be a part of your medical care team.  It is a privilege we respect greatly.  We hope these instructions will help you stay on track for a fast and full recovery!  °  ° ° °Information on my medicine - XARELTO® (Rivaroxaban) ° °This medication education was reviewed with me or my healthcare representative as part of my discharge preparation.  The pharmacist that spoke with me during my hospital stay was:  Laquon Emel Q Shakita Keir, Student-PharmD ° °Why was Xarelto® prescribed for you? °Xarelto® was prescribed for you to reduce the risk of blood clots forming after orthopedic surgery. The medical term for these abnormal blood clots is venous thromboembolism (VTE). ° °What do you need to know about xarelto® ? °Take your Xarelto® ONCE DAILY at the same time every day. °You may take it either with or without food. ° °If you have difficulty swallowing the tablet whole, you may crush it and mix in applesauce just prior to taking your dose. ° °Take Xarelto® exactly as prescribed by your doctor and DO NOT stop taking Xarelto® without talking to the doctor who prescribed the medication.  Stopping without other VTE prevention medication to take the place of Xarelto® may increase your risk of developing a clot. ° °After discharge, you should have regular check-up appointments with your healthcare provider that is prescribing your Xarelto®.   ° °What do you do if you miss a dose? °If you miss a dose, take it as soon as you remember on the same day then continue your regularly scheduled once daily regimen the next day. Do not take two doses of Xarelto® on the same day.  ° °Important Safety Information °A possible side effect of Xarelto® is bleeding. You should call your healthcare provider right away    if you experience any of the following: °? Bleeding from an injury or your nose that does not stop. °? Unusual colored urine (red or dark brown) or unusual colored stools (red or black). °? Unusual bruising for unknown reasons. °? A serious fall or if you hit your head (even if there is no bleeding). ° °Some medicines may interact with Xarelto® and might increase your risk of bleeding while on Xarelto®. To help avoid this, consult your healthcare provider or pharmacist prior to using any new prescription or non-prescription medications, including herbals, vitamins, non-steroidal anti-inflammatory drugs (NSAIDs) and supplements. ° °This website has more information on Xarelto®: www.xarelto.com. ° ° ° °

## 2017-02-02 LAB — CBC
HCT: 32.3 % — ABNORMAL LOW (ref 39.0–52.0)
Hemoglobin: 11.1 g/dL — ABNORMAL LOW (ref 13.0–17.0)
MCH: 30.8 pg (ref 26.0–34.0)
MCHC: 34.4 g/dL (ref 30.0–36.0)
MCV: 89.7 fL (ref 78.0–100.0)
Platelets: 253 10*3/uL (ref 150–400)
RBC: 3.6 MIL/uL — ABNORMAL LOW (ref 4.22–5.81)
RDW: 13.8 % (ref 11.5–15.5)
WBC: 17.2 10*3/uL — ABNORMAL HIGH (ref 4.0–10.5)

## 2017-02-02 LAB — BASIC METABOLIC PANEL
Anion gap: 4 — ABNORMAL LOW (ref 5–15)
BUN: 8 mg/dL (ref 6–20)
CALCIUM: 8.4 mg/dL — AB (ref 8.9–10.3)
CHLORIDE: 106 mmol/L (ref 101–111)
CO2: 26 mmol/L (ref 22–32)
CREATININE: 0.64 mg/dL (ref 0.61–1.24)
GFR calc non Af Amer: >60 mL/min (ref >60)
GLUCOSE: 162 mg/dL — AB (ref 65–99)
Potassium: 4.3 mmol/L (ref 3.5–5.1)
Sodium: 136 mmol/L (ref 135–145)

## 2017-02-02 NOTE — Progress Notes (Addendum)
qPhysical Therapy Treatment Patient Details Name: Corey Collins MRN: [id number] DOB: Jun 28, 1961 Today's Date: 02/02/2017    History of Present Illness 56 yo s/p L THA-direct anterior    PT Comments    Improved performance compared to yesterday's session. Practiced/reviewed exercises, gait training, and stair training. Pt was confused as to whether MD wanted HHPT follow up. Explained to pt that [name] THA pt's usually do not d/c home with follow up therapy. Issued HEP for pt to perform 2x/day until follow up with ortho MD. All education completed. Ready to d/c from PT standpoint-made RN aware.   Follow Up Recommendations  Supervision for mobility/OOB     Equipment Recommendations  Rolling walker with 5" wheels    Recommendations for Other Services       Precautions / Restrictions Precautions Precautions: Fall Restrictions Weight Bearing Restrictions: No LLE Weight Bearing: Weight bearing as tolerated    Mobility  Bed Mobility Overal bed mobility: Needs Assistance Bed Mobility: Supine to Sit;Sit to Supine     Supine to sit: Supervision Sit to supine: Supervision   General bed mobility comments: Pt able to hook L LE with R foot to assist onto bed.   Transfers Overall transfer level: Needs assistance Equipment used: Rolling walker (2 wheeled) Transfers: Sit to/from Stand Sit to Stand: Supervision         General transfer comment: for safety. VCs hand placement  Ambulation/Gait Ambulation/Gait assistance: Supervision Ambulation Distance (Feet): 150 Feet Assistive device: Rolling walker (2 wheeled) Gait Pattern/deviations: Step-through pattern;Decreased stride length;Decreased step length - left     General Gait Details: close guard for safety. Improved safety and control on today.    Stairs Stairs: Yes  Min assist Stair Management: Step to pattern;Forwards;One rail Left Number of Stairs: 2 General stair comments: up and over portable steps with  use of 1 rail, 1 straight cane to simulate negotiating steps at pt's home (once returns in a week or so). Verbally reviewed negotiation of 1 step with use of the walker to get into friend's home at d/c.   Wheelchair Mobility    Modified Rankin (Stroke Patients Only)       Balance                                            Cognition Arousal/Alertness: Awake/alert Behavior During Therapy: WFL for tasks assessed/performed Overall Cognitive Status: Within Functional Limits for tasks assessed                                        Exercises Total Joint Exercises Ankle Circles/Pumps: AROM;Both;10 reps;Supine Quad Sets: AROM;Both;10 reps;Supine Heel Slides: AAROM;Left;10 reps;Supine Hip ABduction/ADduction: AAROM;Left;10 reps;Standing Long Arc Quad: AROM;Left;10 reps;Seated Knee Flexion: AROM;Left;10 reps;Standing Marching in Standing: AROM;Both;10 reps;Standing General Exercises - Lower Extremity Heel Raises: AROM;10 reps;Both;Standing    General Comments        Pertinent Vitals/Pain Pain Assessment: Faces Faces Pain Scale: Hurts little more Pain Location: L thigh Pain Descriptors / Indicators: Sore;Tightness Pain Intervention(s): Monitored during session;Repositioned;Ice applied    Home Living Family/patient expects to be discharged to:: Private residence Living Arrangements: Alone Available Help at Discharge: Friend(s) Type of Home: Apartment       Home Equipment: Gilmer Mor - single point Additional Comments: plans to stay with friends on  1 level for a few days, then goes to his apt with many steps.; his apt has lower commode and tub.  He may get an elevated toilet seat for his toilet once he goes back to his apt    Prior Function Level of Independence: Independent with assistive device(s)          PT Goals (current goals can now be found in the care plan section) Acute Rehab PT Goals Patient Stated Goal: to get the other hip  done Progress towards PT goals: Progressing toward goals    Frequency    7X/week      PT Plan Current plan remains appropriate    Co-evaluation             End of Session Equipment Utilized During Treatment: Gait belt Activity Tolerance: Patient tolerated treatment well Patient left: in bed;with call bell/phone within reach   PT Visit Diagnosis: Muscle weakness (generalized) (M62.81);Difficulty in walking, not elsewhere classified (R26.2)     Time: [id number] PT Time Calculation (min) (ACUTE ONLY): 24 min  Charges:  $Gait Training: 8-22 mins $Therapeutic Exercise: 8-22 mins                    G Codes:         [name], MPT Pager: [phone]

## 2017-02-02 NOTE — Progress Notes (Signed)
     Subjective: 1 Day Post-Op Procedure(s) (LRB): LEFT TOTAL HIP ARTHROPLASTY ANTERIOR APPROACH (Left)   Patient reports pain as mild, pain controlled.  States that she already can feel the difference as compared to prior to surgery.  Looking forward to how the hip will be as it heals more. Dr. Charlann Boxerlin discussed waiting 6 weeks and proceeding with the other hip is wanting to do so.  Objective:   VITALS:   Vitals:   02/02/17 0122 02/02/17 0500  BP: (!) 116/49 118/64  Pulse: 67 (!) 56  Resp: 16 16  Temp: 98.6 F (37 C) 98.2 F (36.8 C)    Dorsiflexion/Plantar flexion intact Incision: dressing C/D/I No cellulitis present Compartment soft  LABS  Recent Labs  02/02/17 0415  HGB 11.1*  HCT 32.3*  WBC 17.2*  PLT 253     Recent Labs  02/02/17 0415  NA 136  K 4.3  BUN 8  CREATININE 0.64  GLUCOSE 162*     Assessment/Plan: 1 Day Post-Op Procedure(s) (LRB): LEFT TOTAL HIP ARTHROPLASTY ANTERIOR APPROACH (Left)  Foley cath d/c'ed Advance diet Up with therapy D/C IV fluids Discharge home [facility]. Follow up with OLIN,Oval Cavazos D in 2 weeks.  Contact information:  [facility] [address], [address] [place] [id number] [phone]       Anastasio AuerbachMatthew S. Charlcie Prisco   PAC  02/02/2017, 8:33 AM

## 2017-02-02 NOTE — Evaluation (Signed)
Occupational Therapy Evaluation Patient Details Name: Corey PeerChristopher R Vanduyne MRN: [id number] DOB: 1961/01/22 Today's Date: 02/02/2017    History of Present Illness LDATHA   Clinical Impression   This 56 year old pt was admitted for the above sx. All education was completed. No further OT is needed at this time    Follow Up Recommendations  No OT follow up;Supervision/Assistance - 24 hour    Equipment Recommendations  None recommended by OT :  Pt feels friend's commode is a little higher than normal. Doesn't feel 3:1 would work over the commode at her apt.  She may want to consider an elevated toilet seat   Recommendations for Other Services       Precautions / Restrictions Precautions Precautions: Fall Restrictions Weight Bearing Restrictions: No      Mobility Bed Mobility         Supine to sit: Supervision     General bed mobility comments: supervision for in and out of bed; crossed legs to get back into bed  Transfers   Equipment used: Rolling walker (2 wheeled)   Sit to Stand: Min guard         General transfer comment: cues for UE placement; for safety    Balance                                           ADL either performed or assessed with clinical judgement   ADL Overall ADL's : Needs assistance/impaired Eating/Feeding: Independent   Grooming: Supervision/safety;Standing   Upper Body Bathing: Set up;Supervision/ safety;Sitting   Lower Body Bathing: Set up;Sit to/from stand;With adaptive equipment;Min guard   Upper Body Dressing : Set up;Supervision/safety;Sitting   Lower Body Dressing: Minimal assistance;Sit to/from stand;With adaptive equipment Lower Body Dressing Details (indicate cue type and reason): donned underwear; friends will help with sock.  has slip on shoes.  Used reacher; she has something  she may use at home to extend reach Toilet Transfer: Min guard;Comfort height toilet;Grab bars;RW;Ambulation   Toileting-  ArchitectClothing Manipulation and Hygiene: Min guard;Sit to/from stand   Tub/ Shower Transfer: Walk-in shower;Min guard;Ambulation     General ADL Comments: educated on AE/DME and working within his pain tolerance.  Handout given for shower transfer. Pt doesn't feel a 3:1 would work over his toilet at home--she may look into getting an elevated toilet seat     Vision         Perception     Praxis      Pertinent Vitals/Pain Pain Assessment: Faces Faces Pain Scale: Hurts little more Pain Location: L thigh Pain Descriptors / Indicators: Sore Pain Intervention(s): Limited activity within patient's tolerance;Monitored during session;Premedicated before session;Repositioned;Ice applied     Hand Dominance     Extremity/Trunk Assessment Upper Extremity Assessment Upper Extremity Assessment: Overall WFL for tasks assessed           Communication Communication Communication: No difficulties   Cognition Arousal/Alertness: Awake/alert Behavior During Therapy: WFL for tasks assessed/performed Overall Cognitive Status: Within Functional Limits for tasks assessed                                     General Comments       Exercises     Shoulder Instructions      Home Living Family/patient expects to be discharged to::  Private residence Living Arrangements: Alone Available Help at Discharge: Friend(s) Type of Home: Apartment             Bathroom Shower/Tub: Walk-in Human resources officer: Handicapped height     Home Equipment: Medical laboratory scientific officer - single point   Additional Comments: plans to stay with friends on 1 level for a few days, then goes to his apt with many steps.; his apt has lower commode and tub.  she may get an elevated toilet seat for his toilet once she goes back to her apt      Prior Functioning/Environment Level of Independence: Independent with assistive device(s)                 OT Problem List:        OT Treatment/Interventions:       OT Goals(Current goals can be found in the care plan section) Acute Rehab OT Goals Patient Stated Goal: to get the other hip done OT Goal Formulation: All assessment and education complete, DC therapy  OT Frequency:     Barriers to D/C:            Co-evaluation              End of Session    Activity Tolerance: Patient tolerated treatment well Patient left: in bed;with call bell/phone within reach  OT Visit Diagnosis: Pain Pain - Right/Left: Left Pain - part of body: Hip                Time: 1610-9604 OT Time Calculation (min): 27 min Charges:  OT General Charges $OT Visit: 1 Procedure OT Evaluation $OT Eval Low Complexity: 1 Procedure OT Treatments $Self Care/Home Management : 8-22 mins G-Codes:     [place], OTR/L [phone] 02/02/2017  Jaymin Waln 02/02/2017, 9:12 AM

## 2017-02-05 NOTE — Anesthesia Postprocedure Evaluation (Signed)
Anesthesia Post Note  Patient: Corey Collins  Procedure(s) Performed: Procedure(s) (LRB): LEFT TOTAL HIP ARTHROPLASTY ANTERIOR APPROACH (Left)  Patient location during evaluation: PACU Anesthesia Type: Spinal Level of consciousness: awake Pain management: satisfactory to patient Vital Signs Assessment: post-procedure vital signs reviewed and stable Respiratory status: spontaneous breathing Cardiovascular status: blood pressure returned to baseline Postop Assessment: no headache and spinal receding Anesthetic complications: no        Last Vitals:  Vitals:   02/02/17 0122 02/02/17 0500  BP: (!) 116/49 118/64  Pulse: 67 (!) 56  Resp: 16 16  Temp: 37 C 36.8 C    Last Pain:  Vitals:   02/02/17 1008  TempSrc:   PainSc: 5    Pain Goal: Patients Stated Pain Goal: 2 (02/02/17 1610)               Jiles Garter

## 2017-02-07 NOTE — Discharge Summary (Signed)
Physician Discharge Summary  Patient ID: Corey Collins MRN: [id number] DOB/AGE: 08-Sep-1961 56 y.o.  Admit date: 02/01/2017 Discharge date: 02/02/2017   Procedures:  Procedure(s) (LRB): LEFT TOTAL HIP ARTHROPLASTY ANTERIOR APPROACH (Left)  Attending Physician:  Dr. Durene Romans   Admission Diagnoses:   Left hip primary OA / pain  Discharge Diagnoses:  Principal Problem:   S/P left THA, AA Active Problems:   S/P hip replacement  Past Medical History:  Diagnosis Date  . Arthritis    oa  . Asthma    eith cats  . Borderline diabetes   . DVT (deep venous thrombosis) (HCC) 2009   L calf  . Headache    sinus  . History of kidney stones   . Pneumonia age 29 or 33  . PTSD (post-traumatic stress disorder)   . Puncture wound    2010 left lower leg   . Seasonal allergies     HPI:    Corey Collins, 56 y.o. male, has a history of pain and functional disability in the left hip(s) due to arthritis and patient has failed non-surgical conservative treatments for greater than 12 weeks to include NSAID's and/or analgesics, corticosteriod injections, use of assistive devices and activity modification.  Onset of symptoms was gradual starting 5+ years ago with gradually worsening course since that time.The patient noted no past surgery on the left hip(s).  Patient currently rates pain in the left hip at 10 out of 10 with activity. Patient has night pain, worsening of pain with activity and weight bearing, trendelenberg gait, pain that interfers with activities of daily living and pain with passive range of motion. Patient has evidence of periarticular osteophytes and joint space narrowing by imaging studies. This condition presents safety issues increasing the risk of falls.  There is no current active infection.   Risks, benefits and expectations were discussed with the patient.  Risks including but not limited to the risk of anesthesia, blood clots, nerve damage, blood vessel damage,  failure of the prosthesis, infection and up to and including death.  Patient understand the risks, benefits and expectations and wishes to proceed with surgery.   PCP: Corey Ora, MD   Discharged Condition: good  Hospital Course:  Patient underwent the above stated procedure on 02/01/2017. Patient tolerated the procedure well and brought to the recovery room in good condition and subsequently to the floor.  POD #1 BP: 118/64 ; Pulse: 55 ; Temp: 98.2 F (36.8 C) ; Resp: 16 Patient reports pain as mild, pain controlled.  States that she already can feel the difference as compared to prior to surgery.  Looking forward to how the hip will be as it heals more. Dr. Charlann Boxer discussed waiting 6 weeks and proceeding with the other hip is wanting to do so. Dorsiflexion/plantar flexion intact, incision: dressing C/D/I, no cellulitis present and compartment soft.   LABS  Basename    HGB     11.1  HCT     32.3    Discharge Exam: General appearance: alert, cooperative and no distress Extremities: Homans sign is negative, no sign of DVT, no edema, redness or tenderness in the calves or thighs and no ulcers, gangrene or trophic changes  Disposition: Home with follow up in 2 weeks   Follow-up Information    Corey Pal, MD. Schedule an appointment as soon as possible for a visit in 2 week(s).   Specialty:  Orthopedic Surgery Contact information: [address] [place] [address] [phone]  Discharge Instructions    Call MD / Call 911    Complete by:  As directed    If you experience chest pain or shortness of breath, CALL 911 and be transported to the hospital emergency room.  If you develope a fever above 101 F, pus (white drainage) or increased drainage or redness at the wound, or calf pain, call your surgeon's office.   Change dressing    Complete by:  As directed    Maintain surgical dressing until follow up in the clinic. If the edges start to pull  up, may reinforce with tape. If the dressing is no longer working, may remove and cover with gauze and tape, but must keep the area dry and clean.  Call with any questions or concerns.   Constipation Prevention    Complete by:  As directed    Drink plenty of fluids.  Prune juice may be helpful.  You may use a stool softener, such as Colace (over the counter) 100 mg twice a day.  Use MiraLax (over the counter) for constipation as needed.   Diet - low sodium heart healthy    Complete by:  As directed    Discharge instructions    Complete by:  As directed    Maintain surgical dressing until follow up in the clinic. If the edges start to pull up, may reinforce with tape. If the dressing is no longer working, may remove and cover with gauze and tape, but must keep the area dry and clean.  Follow up in 2 weeks at [facility]. Call with any questions or concerns.   Increase activity slowly as tolerated    Complete by:  As directed    Weight bearing as tolerated with assist device (walker, cane, etc) as directed, use it as long as suggested by your surgeon or therapist, typically at least 4-6 weeks.   TED hose    Complete by:  As directed    Use stockings (TED hose) for 2 weeks on both leg(s).  You may remove them at night for sleeping.      Allergies as of 02/02/2017      Reactions   Sulfa Antibiotics Other (See Comments)   Family history, [place] [address] did ok with      Medication List    STOP taking these medications   HYDROcodone-acetaminophen 5-325 MG tablet Commonly known as:  NORCO Replaced by:  HYDROcodone-acetaminophen 7.5-325 MG tablet   ibuprofen 200 MG tablet Commonly known as:  ADVIL,MOTRIN     TAKE these medications   albuterol 108 (90 Base) MCG/ACT inhaler Commonly known as:  PROVENTIL HFA;VENTOLIN HFA Inhale 2 puffs into the lungs every 6 (six) hours as needed for wheezing or shortness of breath.   amitriptyline 50 MG tablet Commonly known as:   ELAVIL Take 50 mg by mouth at bedtime as needed for sleep.   aspirin 81 MG chewable tablet Chew 1 tablet (81 mg total) by mouth 2 (two) times daily. Take for 4 weeks.   citalopram 40 MG tablet Commonly known as:  CELEXA Take 40 mg by mouth daily.   [employer] PRENATAL GUMMY PO Take 2 tablets by mouth daily.   docusate sodium 100 MG capsule Commonly known as:  COLACE Take 1 capsule (100 mg total) by mouth 2 (two) times daily.   ferrous sulfate 325 (65 FE) MG tablet Commonly known as:  FERROUSUL Take 1 tablet (325 mg total) by mouth 3 (three) times daily with meals.  HYDROcodone-acetaminophen 7.5-325 MG tablet Commonly known as:  NORCO Take 1-2 tablets by mouth every 4 (four) hours as needed for moderate pain or severe pain. Replaces:  HYDROcodone-acetaminophen 5-325 MG tablet   lithium 300 MG tablet Take 300 mg by mouth 3 (three) times daily.   metFORMIN 500 MG tablet Commonly known as:  GLUCOPHAGE Take 500 mg by mouth 2 (two) times daily with a meal.   methocarbamol 500 MG tablet Commonly known as:  ROBAXIN Take 1 tablet (500 mg total) by mouth every 6 (six) hours as needed for muscle spasms.   methylphenidate 10 MG tablet Commonly known as:  RITALIN Take 20 mg by mouth daily.   polyethylene glycol packet Commonly known as:  MIRALAX / GLYCOLAX Take 17 g by mouth 2 (two) times daily.        Signed: Anastasio Auerbach. Tajai Ihde   PA-C  02/07/2017, 2:58 PM

## 2017-05-13 ENCOUNTER — Other Ambulatory Visit (HOSPITAL_COMMUNITY): Payer: Self-pay | Admitting: Emergency Medicine

## 2017-05-13 NOTE — Progress Notes (Signed)
LOV/surgical clearance Dr Mardelle MatteAndy 05-12-17 epic care everywhere  EKG 01-27-17 epic HgA1c,CBCdiff, CMP 05-12-17 epic

## 2017-05-13 NOTE — Patient Instructions (Signed)
SEVERUS BRODZINSKI  05/13/2017   Your procedure is scheduled on: 05-24-17  Report to [facility] Main  Entrance  Follow signs to Short Stay on first floor at 515AM   Call this number if you have problems the morning of surgery [phone]   Remember: ONLY 1 PERSON MAY GO WITH YOU TO SHORT STAY TO GET  READY MORNING OF YOUR SURGERY.  Do not eat food or drink liquids :After Midnight.     Take these medicines the morning of surgery with A SIP OF WATER: lithium, celexa, inhaler as needed (may bring to hospital)                                 You may not have any metal on your body including hair pins and              piercings  Do not wear jewelry, make-up, lotions, powders or perfumes, deodorant             Do not wear nail polish.  Do not shave  48 hours prior to surgery but may shave face and neck.   Do not bring valuables to the hospital. [facility] IS NOT             RESPONSIBLE   FOR VALUABLES.  Contacts, dentures or bridgework may not be worn into surgery.  Leave suitcase in the car. After surgery it may be brought to your room.               Please read over the following fact sheets you were given: _____________________________________________________________________  How to Manage Your Diabetes Before and After Surgery  Why is it important to control my blood sugar before and after surgery? . Improving blood sugar levels before and after surgery helps healing and can limit problems. . A way of improving blood sugar control is eating a healthy diet by: o  Eating less sugar and carbohydrates o  Increasing activity/exercise o  Talking with your doctor about reaching your blood sugar goals . High blood sugars (greater than 180 mg/dL) can raise your risk of infections and slow your recovery, so you will need to focus on controlling your diabetes during the weeks before surgery. . Make sure that the doctor who takes care of your diabetes knows about  your planned surgery including the date and location.  How do I manage my blood sugar before surgery? . Check your blood sugar at least 4 times a day, starting 2 days before surgery, to make sure that the level is not too high or low. o Check your blood sugar the morning of your surgery when you wake up and every 2 hours until you get to the Short Stay unit. . If your blood sugar is less than 70 mg/dL, you will need to treat for low blood sugar: o Do not take insulin. o Treat a low blood sugar (less than 70 mg/dL) with  cup of clear juice (cranberry or apple), 4 glucose tablets, OR glucose gel. o Recheck blood sugar in 15 minutes after treatment (to make sure it is greater than 70 mg/dL). If your blood sugar is not greater than 70 mg/dL on recheck, call [phone] for further instructions. . Report your blood sugar to the short stay nurse when you get to Short Stay.  Marland Kitchen  If you are admitted to the hospital after surgery: o Your blood sugar will be checked by the staff and you will probably be given insulin after surgery (instead of oral diabetes medicines) to make sure you have good blood sugar levels. o The goal for blood sugar control after surgery is 80-180 mg/dL.   WHAT DO I DO ABOUT MY DIABETES MEDICATION?   . THE DAY BEFORE SURGERY, take   Metformin as usual       . THE MORNING OF SURGERY, Do not take oral diabetes medicines (pills)   Patient Signature:  Date:   Nurse Signature:  Date:   Reviewed and Endorsed by [facility] Patient Education Committee, August 2015            [facility] - Preparing [facility] Before surgery, you can play an important role.  Because skin is not sterile, your skin needs to be as free of germs as possible.  You can reduce the number of germs on your skin by washing with CHG (chlorahexidine gluconate) soap before surgery.  CHG is an antiseptic cleaner which kills germs and bonds with the skin to continue killing germs even after washing. Please  DO NOT use if you have an allergy to CHG or antibacterial soaps.  If your skin becomes reddened/irritated stop using the CHG and inform your nurse when you arrive at Short Stay. Do not shave (including legs and underarms) for at least 48 hours prior to the first CHG shower.  You may shave your face/neck. Please follow these instructions carefully:  1.  Shower with CHG Soap the night before surgery and the  morning of Surgery.  2.  If you choose to wash your hair, wash your hair first as usual with your  normal  shampoo.  3.  After you shampoo, rinse your hair and body thoroughly to remove the  shampoo.                           4.  Use CHG as you would any other liquid soap.  You can apply chg directly  to the skin and wash                       Gently with a scrungie or clean washcloth.  5.  Apply the CHG Soap to your body ONLY FROM THE NECK DOWN.   Do not use on face/ open                           Wound or open sores. Avoid contact with eyes, ears mouth and genitals (private parts).                       Wash face,  Genitals (private parts) with your normal soap.             6.  Wash thoroughly, paying special attention to the area where your surgery  will be performed.  7.  Thoroughly rinse your body with warm water from the neck down.  8.  DO NOT shower/wash with your normal soap after using and rinsing off  the CHG Soap.                9.  Pat yourself dry with a clean towel.            10.  Wear clean pajamas.  11.  Place clean sheets on your bed the night of your first shower and do not  sleep with pets. Day of Surgery : Do not apply any lotions/deodorants the morning of surgery.  Please wear clean clothes to the hospital/surgery center.  FAILURE TO FOLLOW THESE INSTRUCTIONS MAY RESULT IN THE CANCELLATION OF YOUR SURGERY PATIENT SIGNATURE_________________________________  NURSE  SIGNATURE__________________________________  ________________________________________________________________________   Adam Phenix  An incentive spirometer is a tool that can help keep your lungs clear and active. This tool measures how well you are filling your lungs with each breath. Taking long deep breaths may help reverse or decrease the chance of developing breathing (pulmonary) problems (especially infection) following:  A long period of time when you are unable to move or be active. BEFORE THE PROCEDURE   If the spirometer includes an indicator to show your [place] effort, your nurse or respiratory therapist will set it to a desired goal.  If possible, sit up straight or lean slightly forward. Try not to slouch.  Hold the incentive spirometer in an upright position. INSTRUCTIONS FOR USE  1. Sit on the edge of your bed if possible, or sit up as far as you can in bed or on a chair. 2. Hold the incentive spirometer in an upright position. 3. Breathe out normally. 4. Place the mouthpiece in your mouth and seal your lips tightly around it. 5. Breathe in slowly and as deeply as possible, raising the piston or the ball toward the top of the column. 6. Hold your breath for 3-5 seconds or for as long as possible. Allow the piston or ball to fall to the bottom of the column. 7. Remove the mouthpiece from your mouth and breathe out normally. 8. Rest for a few seconds and repeat Steps 1 through 7 at least 10 times every 1-2 hours when you are awake. Take your time and take a few normal breaths between deep breaths. 9. The spirometer may include an indicator to show your [place] effort. Use the indicator as a goal to work toward during each repetition. 10. After each set of 10 deep breaths, practice coughing to be sure your lungs are clear. If you have an incision (the cut made at the time of surgery), support your incision when coughing by placing a pillow or rolled up towels firmly  against it. Once you are able to get out of bed, walk around indoors and cough well. You may stop using the incentive spirometer when instructed by your caregiver.  RISKS AND COMPLICATIONS  Take your time so you do not get dizzy or light-headed.  If you are in pain, you may need to take or ask for pain medication before doing incentive spirometry. It is harder to take a deep breath if you are having pain. AFTER USE  Rest and breathe slowly and easily.  It can be helpful to keep track of a log of your progress. Your caregiver can provide you with a simple table to help with this. If you are using the spirometer at home, follow these instructions: [place] IF:   You are having difficultly using the spirometer.  You have trouble using the spirometer as often as instructed.  Your pain medication is not giving enough relief while using the spirometer.  You develop fever of 100.5 F (38.1 C) or higher. SEEK IMMEDIATE MEDICAL CARE IF:   You cough up bloody sputum that had not been present before.  You develop fever of 102 F (38.9 C)  or greater.  You develop worsening pain at or near the incision site. MAKE SURE YOU:   Understand these instructions.  Will watch your condition.  Will get help right away if you are not doing well or get worse. Document Released: 03/07/2007 Document Revised: 01/17/2012 Document Reviewed: 05/08/2007 ExitCare Patient Information 2014 ExitCare, [place].   ________________________________________________________________________  WHAT IS A BLOOD TRANSFUSION? Blood Transfusion Information  A transfusion is the replacement of blood or some of its parts. Blood is made up of multiple cells which provide different functions.  Red blood cells carry oxygen and are used for blood loss replacement.  White blood cells fight against infection.  Platelets control bleeding.  Plasma helps clot blood.  Other blood products are available for  specialized needs, such as hemophilia or other clotting disorders. BEFORE THE TRANSFUSION  Who gives blood for transfusions?   Healthy volunteers who are fully evaluated to make sure their blood is safe. This is blood bank blood. Transfusion therapy is the safest it has ever been in the practice of medicine. Before blood is taken from a donor, a complete history is taken to make sure that person has no history of diseases nor engages in risky social behavior (examples are intravenous drug use or sexual activity with multiple partners). The donor's travel history is screened to minimize risk of transmitting infections, such as malaria. The donated blood is tested for signs of infectious diseases, such as HIV and hepatitis. The blood is then tested to be sure it is compatible with you in order to minimize the chance of a transfusion reaction. If you or a relative donates blood, this is often done in anticipation of surgery and is not appropriate for emergency situations. It takes many days to process the donated blood. RISKS AND COMPLICATIONS Although transfusion therapy is very safe and saves many lives, the main dangers of transfusion include:   Getting an infectious disease.  Developing a transfusion reaction. This is an allergic reaction to something in the blood you were given. Every precaution is taken to prevent this. The decision to have a blood transfusion has been considered carefully by your caregiver before blood is given. Blood is not given unless the benefits outweigh the risks. AFTER THE TRANSFUSION  Right after receiving a blood transfusion, you will usually feel much better and more energetic. This is especially true if your red blood cells have gotten low (anemic). The transfusion raises the level of the red blood cells which carry oxygen, and this usually causes an energy increase.  The nurse administering the transfusion will monitor you carefully for complications. HOME CARE  INSTRUCTIONS  No special instructions are needed after a transfusion. You may find your energy is better. Speak with your caregiver about any limitations on activity for underlying diseases you may have. SEEK MEDICAL CARE IF:   Your condition is not improving after your transfusion.  You develop redness or irritation at the intravenous (IV) site. SEEK IMMEDIATE MEDICAL CARE IF:  Any of the following symptoms occur over the next 12 hours:  Shaking chills.  You have a temperature by mouth above 102 F (38.9 C), not controlled by medicine.  Chest, back, or muscle pain.  People around you feel you are not acting correctly or are confused.  Shortness of breath or difficulty breathing.  Dizziness and fainting.  You get a rash or develop hives.  You have a decrease in urine output.  Your urine turns a dark color or changes to pink, red,  or brown. Any of the following symptoms occur over the next 10 days:  You have a temperature by mouth above 102 F (38.9 C), not controlled by medicine.  Shortness of breath.  Weakness after normal activity.  The white part of the eye turns yellow (jaundice).  You have a decrease in the amount of urine or are urinating less often.  Your urine turns a dark color or changes to pink, red, or brown. Document Released: 10/22/2000 Document Revised: 01/17/2012 Document Reviewed: 06/10/2008 [facility] Patient Information 2014 [place], [place].  _______________________________________________________________________

## 2017-05-17 ENCOUNTER — Encounter (HOSPITAL_COMMUNITY)
Admission: RE | Admit: 2017-05-17 | Discharge: 2017-05-17 | Disposition: A | Discharge status: Home / Self Care | Payer: Medicare (Managed Care) | Source: Ambulatory Visit | Attending: Orthopedic Surgery | Admitting: Orthopedic Surgery

## 2017-05-17 ENCOUNTER — Encounter (HOSPITAL_COMMUNITY): Payer: Self-pay

## 2017-05-17 DIAGNOSIS — Z0183 Encounter for blood typing: Secondary | ICD-10-CM | POA: Insufficient documentation

## 2017-05-17 DIAGNOSIS — Z01812 Encounter for preprocedural laboratory examination: Secondary | ICD-10-CM | POA: Insufficient documentation

## 2017-05-17 DIAGNOSIS — M1611 Unilateral primary osteoarthritis, right hip: Secondary | ICD-10-CM | POA: Insufficient documentation

## 2017-05-17 LAB — GLUCOSE, CAPILLARY: Glucose-Capillary: 92 mg/dL (ref 65–99)

## 2017-05-17 LAB — SURGICAL PCR SCREEN
MRSA, PCR: NEGATIVE
Staphylococcus aureus: NEGATIVE

## 2017-05-20 NOTE — Anesthesia Postprocedure Evaluation (Signed)
Anesthesia Post Note  Patient: Corey PeerChristopher R Blum  Procedure(s) Performed: Procedure(s) (LRB): LEFT TOTAL HIP ARTHROPLASTY ANTERIOR APPROACH (Left)     Anesthesia Post Evaluation  Last Vitals:  Vitals:   02/02/17 0122 02/02/17 0500  BP: (!) 116/49 118/64  Pulse: 67 (!) 56  Resp: 16 16  Temp: 37 C 36.8 C    Last Pain:  Vitals:   02/02/17 1008  TempSrc:   PainSc: 5                  [name] EDWARD

## 2017-05-20 NOTE — Addendum Note (Signed)
Addendum  created 05/20/17 1346 by Grantley Savage, MD   Sign clinical note    

## 2017-05-23 ENCOUNTER — Encounter (HOSPITAL_COMMUNITY): Payer: Self-pay | Admitting: Anesthesiology

## 2017-05-23 NOTE — H&P (Signed)
TOTAL HIP ADMISSION H&P  Patient is admitted for right total hip arthroplasty, anterior approach.  Subjective:  Chief Complaint:    Right hip primary OA / pain  HPI: Corey Collins, 56 y.o. male, has a history of pain and functional disability in the right hip(s) due to arthritis and patient has failed non-surgical conservative treatments for greater than 12 weeks to include NSAID's and/or analgesics and activity modification.  Onset of symptoms was gradual starting  years ago with gradually worsening course since that time.The patient noted prior procedures of the hip to include arthroplasty on the left hip, by Dr. Charlann Boxer (02/01/2017).  Patient currently rates pain in the right hip at 8 out of 10 with activity. Patient has worsening of pain with activity and weight bearing, trendelenberg gait, pain that interfers with activities of daily living and pain with passive range of motion. Patient has evidence of periarticular osteophytes and joint space narrowing by imaging studies. This condition presents safety issues increasing the risk of falls.   There is no current active infection.   Risks, benefits and expectations were discussed with the patient.  Risks including but not limited to the risk of anesthesia, blood clots, nerve damage, blood vessel damage, failure of the prosthesis, infection and up to and including death.  Patient understand the risks, benefits and expectations and wishes to proceed with surgery.   PCP: [name], MD  D/C Plans:       Home   Post-op Meds:       No Rx given  Tranexamic Acid:      To be given - topical (previous DVT)  Decadron:      Is to be given  FYI:     ASA  Norco  DME:   Pt already has equipment  PT:   No PT    Patient Active Problem List   Diagnosis Date Noted  . 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 S/P hip replacement 02/01/2017   Past Medical History:  Diagnosis Date  . Arthritis    oa  . Asthma    eith cats  . Borderline diabetes   .  DVT (deep venous thrombosis) (HCC) 2009   L calf  . Headache    sinus  . History of kidney stones   . Pneumonia age 58 or 47  . PTSD (post-traumatic stress disorder)   . Puncture wound    2010 left lower leg   . Seasonal allergies     Past Surgical History:  Procedure Laterality Date  . APPENDECTOMY    . HERNIA REPAIR    . NASAL FRACTURE SURGERY    . TOTAL HIP ARTHROPLASTY Left 02/01/2017   Procedure: LEFT TOTAL HIP ARTHROPLASTY ANTERIOR APPROACH;  Surgeon: Durene Romans, MD;  Location: [facility] ORS;  Service: Orthopedics;  Laterality: Left;    No prescriptions prior to admission.   Allergies  Allergen Reactions  . Sulfa Antibiotics Other (See Comments)    Family history, took in [place] in [address] did ok with    Social History  Substance Use Topics  . Smoking status: Current Some Day Smoker    Packs/day: 0.50    Years: 7.00    Types: Cigarettes    Last attempt to quit: 01/06/2017  . Smokeless tobacco: Never Used  . Alcohol use No    Family History  Problem Relation Age of Onset  . Coronary artery disease Father   . Hypertension Other   . Diabetes Other  Review of Systems  Constitutional: Negative.   HENT: Negative.   Eyes: Negative.   Respiratory: Negative.   Cardiovascular: Negative.   Gastrointestinal: Negative.   Genitourinary: Negative.   Musculoskeletal: Positive for joint pain.  Skin: Negative.   Neurological: Positive for headaches.  Endo/Heme/Allergies: Positive for environmental allergies.  Psychiatric/Behavioral:       PTSD    Objective:  Physical Exam  Constitutional: He is oriented to person, place, and time. He appears well-developed.  HENT:  Head: Normocephalic.  Eyes: Pupils are equal, round, and reactive to light.  Neck: Neck supple. No JVD present. No tracheal deviation present. No thyromegaly present.  Cardiovascular: Normal rate, regular rhythm and intact distal pulses.   Respiratory: Effort normal and breath sounds normal. No respiratory  distress. He has no wheezes.  GI: Soft. There is no tenderness. There is no guarding.  Musculoskeletal:       Right hip: He exhibits decreased range of motion, decreased strength, tenderness and bony tenderness. He exhibits no swelling, no deformity and no laceration.  Lymphadenopathy:    He has no cervical adenopathy.  Neurological: He is alert and oriented to person, place, and time.  Skin: Skin is warm and dry.  Psychiatric: He has a normal mood and affect.       Labs:  Estimated body mass index is 27.88 kg/m as calculated from the following:   Height as of 05/17/17: 5\' 9"  (1.753 m).   Weight as of 05/17/17: 85.6 kg (188 lb 12.8 oz).   Imaging Review Plain radiographs demonstrate severe degenerative joint disease of the right hip(s). The bone quality appears to be good for age and reported activity level.  Assessment/Plan:  End stage arthritis, right hip(s)  The patient history, physical examination, clinical judgement of the provider and imaging studies are consistent with end stage degenerative joint disease of the right hip(s) and total hip arthroplasty is deemed medically necessary. The treatment options including medical management, injection therapy, arthroscopy and arthroplasty were discussed at length. The risks and benefits of total hip arthroplasty were presented and reviewed. The risks due to aseptic loosening, infection, stiffness, dislocation/subluxation,  thromboembolic complications and other imponderables were discussed.  The patient acknowledged the explanation, agreed to proceed with the plan and consent was signed. Patient is being admitted for inpatient treatment for surgery, pain control, PT, OT, prophylactic antibiotics, VTE prophylaxis, progressive ambulation and ADL's and discharge planning.The patient is planning to be discharged home.      Anastasio AuerbachMatthew S. Attikus Bartoszek   PA-C  05/23/2017, 3:46 PM

## 2017-05-23 NOTE — Anesthesia Preprocedure Evaluation (Addendum)
Anesthesia Evaluation  Patient identified by MRN, date of birth, ID band Patient awake    Reviewed: Allergy & Precautions, NPO status , Patient's Chart, lab work & pertinent test results  Airway Mallampati: I       Dental  (+) Teeth Intact, Dental Advisory Given   Pulmonary asthma , Current Smoker,    breath sounds clear to auscultation       Cardiovascular negative cardio ROS   Rhythm:Regular Rate:Normal     Neuro/Psych  Headaches, PSYCHIATRIC DISORDERS    GI/Hepatic negative GI ROS, Neg liver ROS,   Endo/Other  diabetes, Type 2, Oral Hypoglycemic Agents  Renal/GU negative Renal ROS     Musculoskeletal  (+) Arthritis , Osteoarthritis,    Abdominal   Peds  Hematology negative hematology ROS (+)   Anesthesia Other Findings Day of surgery medications reviewed with the patient.  Reproductive/Obstetrics                            Anesthesia Physical Anesthesia Plan  ASA: II  Anesthesia Plan: Spinal   Post-op Pain Management:    Induction:   PONV Risk Score and Plan: 1 and Ondansetron and Dexamethasone  Airway Management Planned: Natural Airway  Additional Equipment:   Intra-op Plan:   Post-operative Plan:   Informed Consent: I have reviewed the patients History and Physical, chart, labs and discussed the procedure including the risks, benefits and alternatives for the proposed anesthesia with the patient or authorized representative who has indicated his/her understanding and acceptance.     Plan Discussed with:   Anesthesia Plan Comments:         Anesthesia Quick Evaluation

## 2017-05-24 ENCOUNTER — Inpatient Hospital Stay (HOSPITAL_COMMUNITY): Payer: Medicare Other

## 2017-05-24 ENCOUNTER — Inpatient Hospital Stay (HOSPITAL_COMMUNITY): Payer: Medicare Other | Admitting: Anesthesiology

## 2017-05-24 ENCOUNTER — Encounter (HOSPITAL_COMMUNITY)
Admission: RE | Disposition: A | Discharge status: Home / Self Care | Payer: Self-pay | Source: Home / Self Care | Attending: Orthopedic Surgery

## 2017-05-24 ENCOUNTER — Inpatient Hospital Stay (HOSPITAL_COMMUNITY)
Admission: RE | Admit: 2017-05-24 | Discharge: 2017-05-25 | DRG: 470 | Disposition: A | Discharge status: Home / Self Care | Payer: Medicare Other | Attending: Orthopedic Surgery | Admitting: Orthopedic Surgery

## 2017-05-24 ENCOUNTER — Encounter (HOSPITAL_COMMUNITY): Payer: Self-pay

## 2017-05-24 DIAGNOSIS — Z96642 Presence of left artificial hip joint: Secondary | ICD-10-CM | POA: Diagnosis present

## 2017-05-24 DIAGNOSIS — R7303 Prediabetes: Secondary | ICD-10-CM | POA: Diagnosis present

## 2017-05-24 DIAGNOSIS — Z96641 Presence of right artificial hip joint: Secondary | ICD-10-CM

## 2017-05-24 DIAGNOSIS — J45909 Unspecified asthma, uncomplicated: Secondary | ICD-10-CM | POA: Diagnosis present

## 2017-05-24 DIAGNOSIS — M1611 Unilateral primary osteoarthritis, right hip: Principal | ICD-10-CM | POA: Diagnosis present

## 2017-05-24 DIAGNOSIS — Z6825 Body mass index (BMI) 25.0-25.9, adult: Secondary | ICD-10-CM | POA: Diagnosis not present

## 2017-05-24 DIAGNOSIS — Z833 Family history of diabetes mellitus: Secondary | ICD-10-CM | POA: Diagnosis not present

## 2017-05-24 DIAGNOSIS — Z86718 Personal history of other venous thrombosis and embolism: Secondary | ICD-10-CM | POA: Diagnosis not present

## 2017-05-24 DIAGNOSIS — Z882 Allergy status to sulfonamides status: Secondary | ICD-10-CM | POA: Diagnosis not present

## 2017-05-24 DIAGNOSIS — M25551 Pain in right hip: Secondary | ICD-10-CM | POA: Diagnosis present

## 2017-05-24 DIAGNOSIS — Z87442 Personal history of urinary calculi: Secondary | ICD-10-CM

## 2017-05-24 DIAGNOSIS — F4310 Post-traumatic stress disorder, unspecified: Secondary | ICD-10-CM | POA: Diagnosis present

## 2017-05-24 DIAGNOSIS — F17210 Nicotine dependence, cigarettes, uncomplicated: Secondary | ICD-10-CM | POA: Diagnosis present

## 2017-05-24 DIAGNOSIS — E663 Overweight: Secondary | ICD-10-CM | POA: Diagnosis present

## 2017-05-24 DIAGNOSIS — Z96649 Presence of unspecified artificial hip joint: Secondary | ICD-10-CM

## 2017-05-24 HISTORY — PX: TOTAL HIP ARTHROPLASTY: SHX124

## 2017-05-24 LAB — GLUCOSE, CAPILLARY
GLUCOSE-CAPILLARY: 104 mg/dL — AB (ref 65–99)
GLUCOSE-CAPILLARY: 276 mg/dL — AB (ref 65–99)
GLUCOSE-CAPILLARY: 176 mg/dL — AB (ref 65–99)
Glucose-Capillary: 146 mg/dL — ABNORMAL HIGH (ref 65–99)
Glucose-Capillary: 230 mg/dL — ABNORMAL HIGH (ref 65–99)

## 2017-05-24 LAB — TYPE AND SCREEN
ABO/RH(D): A POS
Antibody Screen: NEGATIVE

## 2017-05-24 SURGERY — ARTHROPLASTY, HIP, TOTAL, ANTERIOR APPROACH
Anesthesia: Spinal | Site: Hip | Laterality: Right

## 2017-05-24 MED ORDER — ONDANSETRON HCL 4 MG PO TABS: 4.0000 mg | ORAL_TABLET | Freq: Four times a day (QID) | ORAL | Status: DC | PRN

## 2017-05-24 MED ORDER — PROPOFOL 10 MG/ML IV BOLUS
INTRAVENOUS | Status: AC
  Filled 2017-05-24: qty 40

## 2017-05-24 MED ORDER — METOCLOPRAMIDE HCL 5 MG/ML IJ SOLN: 5.0000 mg | Freq: Three times a day (TID) | INTRAMUSCULAR | Status: DC | PRN

## 2017-05-24 MED ORDER — TRANEXAMIC ACID 1000 MG/10ML IV SOLN
INTRAVENOUS | Status: AC | PRN
  Administered 2017-05-24: 2000 mg via TOPICAL

## 2017-05-24 MED ORDER — POLYETHYLENE GLYCOL 3350 17 G PO PACK: 17.0000 g | PACK | Freq: Two times a day (BID) | ORAL | Status: DC

## 2017-05-24 MED ORDER — MENTHOL 3 MG MT LOZG: 1.0000 | LOZENGE | OROMUCOSAL | Status: DC | PRN

## 2017-05-24 MED ORDER — FENTANYL CITRATE (PF) 100 MCG/2ML IJ SOLN
INTRAMUSCULAR | Status: DC | PRN
  Administered 2017-05-24: 100 ug via INTRAVENOUS

## 2017-05-24 MED ORDER — ONDANSETRON HCL 4 MG/2ML IJ SOLN
INTRAMUSCULAR | Status: DC | PRN
  Administered 2017-05-24: 4 mg via INTRAVENOUS

## 2017-05-24 MED ORDER — ONDANSETRON HCL 4 MG/2ML IJ SOLN
INTRAMUSCULAR | Status: AC
  Filled 2017-05-24: qty 2

## 2017-05-24 MED ORDER — BISACODYL 10 MG RE SUPP: 10.0000 mg | Freq: Every day | RECTAL | Status: DC | PRN

## 2017-05-24 MED ORDER — LITHIUM CARBONATE 300 MG PO TABS: 900.0000 mg | ORAL_TABLET | Freq: Every day | ORAL | Status: DC

## 2017-05-24 MED ORDER — DEXAMETHASONE SODIUM PHOSPHATE 10 MG/ML IJ SOLN
INTRAMUSCULAR | Status: AC
  Filled 2017-05-24: qty 1

## 2017-05-24 MED ORDER — FENTANYL CITRATE (PF) 100 MCG/2ML IJ SOLN
25.0000 ug | INTRAMUSCULAR | Status: DC | PRN
  Administered 2017-05-24: 50 ug via INTRAVENOUS
  Filled 2017-05-24: qty 2

## 2017-05-24 MED ORDER — METHYLPHENIDATE HCL 10 MG PO TABS
20.0000 mg | ORAL_TABLET | Freq: Every day | ORAL | Status: DC
  Administered 2017-05-25: 20 mg via ORAL
  Filled 2017-05-24: qty 2

## 2017-05-24 MED ORDER — SODIUM CHLORIDE 0.9 % IV SOLN
INTRAVENOUS | Status: DC
  Administered 2017-05-24 – 2017-05-25 (×2): via INTRAVENOUS

## 2017-05-24 MED ORDER — METHOCARBAMOL 1000 MG/10ML IJ SOLN
500.0000 mg | Freq: Four times a day (QID) | INTRAVENOUS | Status: DC | PRN
  Administered 2017-05-24: 500 mg via INTRAVENOUS
  Filled 2017-05-24: qty 550

## 2017-05-24 MED ORDER — DIPHENHYDRAMINE HCL 25 MG PO CAPS: 25.0000 mg | ORAL_CAPSULE | Freq: Four times a day (QID) | ORAL | Status: DC | PRN

## 2017-05-24 MED ORDER — MIDAZOLAM HCL 2 MG/2ML IJ SOLN
INTRAMUSCULAR | Status: AC
  Filled 2017-05-24: qty 2

## 2017-05-24 MED ORDER — POLYETHYLENE GLYCOL 3350 17 G PO PACK: 17.0000 g | PACK | Freq: Two times a day (BID) | ORAL | 0 refills | Status: DC

## 2017-05-24 MED ORDER — TRANEXAMIC ACID 1000 MG/10ML IV SOLN
2000.0000 mg | Freq: Once | INTRAVENOUS | Status: DC
  Filled 2017-05-24: qty 20

## 2017-05-24 MED ORDER — BUPIVACAINE HCL (PF) 0.5 % IJ SOLN
INTRAMUSCULAR | Status: AC
  Filled 2017-05-24: qty 30

## 2017-05-24 MED ORDER — SODIUM CHLORIDE 0.9 % IR SOLN
Status: DC | PRN
  Administered 2017-05-24: 1000 mL

## 2017-05-24 MED ORDER — DOCUSATE SODIUM 100 MG PO CAPS: 100.0000 mg | ORAL_CAPSULE | Freq: Two times a day (BID) | ORAL | 0 refills | Status: DC

## 2017-05-24 MED ORDER — PROMETHAZINE HCL 25 MG/ML IJ SOLN: 6.2500 mg | INTRAMUSCULAR | Status: DC | PRN

## 2017-05-24 MED ORDER — HYDROCODONE-ACETAMINOPHEN 7.5-325 MG PO TABS: 1.0000 | ORAL_TABLET | ORAL | 0 refills | Status: DC | PRN

## 2017-05-24 MED ORDER — METHOCARBAMOL 500 MG PO TABS: 500.0000 mg | ORAL_TABLET | Freq: Four times a day (QID) | ORAL | 0 refills | Status: DC | PRN

## 2017-05-24 MED ORDER — ONDANSETRON HCL 4 MG/2ML IJ SOLN: 4.0000 mg | Freq: Four times a day (QID) | INTRAMUSCULAR | Status: DC | PRN

## 2017-05-24 MED ORDER — LACTATED RINGERS IV SOLN: INTRAVENOUS | Status: DC

## 2017-05-24 MED ORDER — METOCLOPRAMIDE HCL 5 MG PO TABS
5.0000 mg | ORAL_TABLET | Freq: Three times a day (TID) | ORAL | Status: DC | PRN
Start: 2017-05-24 — End: 2017-05-25

## 2017-05-24 MED ORDER — DEXAMETHASONE SODIUM PHOSPHATE 10 MG/ML IJ SOLN
10.0000 mg | Freq: Once | INTRAMUSCULAR | Status: DC
  Filled 2017-05-24: qty 1

## 2017-05-24 MED ORDER — ALUM & MAG HYDROXIDE-SIMETH 200-200-20 MG/5ML PO SUSP: 15.0000 mL | ORAL | Status: DC | PRN

## 2017-05-24 MED ORDER — LACTATED RINGERS IV SOLN
INTRAVENOUS | Status: DC
  Administered 2017-05-24: 1000 mL via INTRAVENOUS
  Administered 2017-05-24: 09:00:00 via INTRAVENOUS

## 2017-05-24 MED ORDER — FERROUS SULFATE 325 (65 FE) MG PO TABS: 325.0000 mg | ORAL_TABLET | Freq: Three times a day (TID) | ORAL | Status: DC

## 2017-05-24 MED ORDER — HYDROCODONE-ACETAMINOPHEN 7.5-325 MG PO TABS
ORAL_TABLET | ORAL | Status: AC
  Administered 2017-05-24: 1 via ORAL
  Filled 2017-05-24: qty 1

## 2017-05-24 MED ORDER — HYDROMORPHONE HCL-NACL 0.5-0.9 MG/ML-% IV SOSY
PREFILLED_SYRINGE | INTRAVENOUS | Status: AC
Start: 2017-05-24 — End: 2017-05-25
  Filled 2017-05-24: qty 2

## 2017-05-24 MED ORDER — INSULIN ASPART 100 UNIT/ML ~~LOC~~ SOLN: 0.0000 [IU] | Freq: Three times a day (TID) | SUBCUTANEOUS | Status: DC

## 2017-05-24 MED ORDER — CEFAZOLIN SODIUM-DEXTROSE 2-4 GM/100ML-% IV SOLN
2.0000 g | INTRAVENOUS | Status: AC
  Administered 2017-05-24: 2 g via INTRAVENOUS

## 2017-05-24 MED ORDER — DOCUSATE SODIUM 100 MG PO CAPS
100.0000 mg | ORAL_CAPSULE | Freq: Two times a day (BID) | ORAL | Status: DC
  Administered 2017-05-24 – 2017-05-25 (×2): 100 mg via ORAL
  Filled 2017-05-24 (×2): qty 1

## 2017-05-24 MED ORDER — LITHIUM CARBONATE 300 MG PO CAPS
300.0000 mg | ORAL_CAPSULE | Freq: Three times a day (TID) | ORAL | Status: DC
  Administered 2017-05-24 – 2017-05-25 (×2): 300 mg via ORAL
  Filled 2017-05-24 (×3): qty 1

## 2017-05-24 MED ORDER — AMITRIPTYLINE HCL 50 MG PO TABS
50.0000 mg | ORAL_TABLET | Freq: Every evening | ORAL | Status: DC | PRN
  Filled 2017-05-24: qty 1

## 2017-05-24 MED ORDER — CHLORHEXIDINE GLUCONATE 4 % EX LIQD: 60.0000 mL | Freq: Once | CUTANEOUS | Status: DC

## 2017-05-24 MED ORDER — ALBUTEROL SULFATE (2.5 MG/3ML) 0.083% IN NEBU: 2.5000 mg | INHALATION_SOLUTION | Freq: Four times a day (QID) | RESPIRATORY_TRACT | Status: DC | PRN

## 2017-05-24 MED ORDER — HYDROCODONE-ACETAMINOPHEN 7.5-325 MG PO TABS
1.0000 | ORAL_TABLET | ORAL | Status: DC
Start: 2017-05-24 — End: 2017-05-25
  Administered 2017-05-24: 1 via ORAL
  Administered 2017-05-24 – 2017-05-25 (×5): 2 via ORAL
  Filled 2017-05-24 (×5): qty 2

## 2017-05-24 MED ORDER — PROPOFOL 10 MG/ML IV BOLUS
INTRAVENOUS | Status: AC
  Filled 2017-05-24: qty 20

## 2017-05-24 MED ORDER — DEXAMETHASONE SODIUM PHOSPHATE 10 MG/ML IJ SOLN
10.0000 mg | Freq: Once | INTRAMUSCULAR | Status: AC
  Administered 2017-05-24: 10 mg via INTRAVENOUS

## 2017-05-24 MED ORDER — CEFAZOLIN SODIUM-DEXTROSE 2-4 GM/100ML-% IV SOLN
INTRAVENOUS | Status: AC
  Filled 2017-05-24: qty 100

## 2017-05-24 MED ORDER — PROPOFOL 500 MG/50ML IV EMUL
INTRAVENOUS | Status: DC | PRN
  Administered 2017-05-24: 100 ug/kg/min via INTRAVENOUS

## 2017-05-24 MED ORDER — MIDAZOLAM HCL 5 MG/5ML IJ SOLN
INTRAMUSCULAR | Status: DC | PRN
  Administered 2017-05-24: 2 mg via INTRAVENOUS

## 2017-05-24 MED ORDER — CEFAZOLIN SODIUM-DEXTROSE 2-4 GM/100ML-% IV SOLN
2.0000 g | Freq: Four times a day (QID) | INTRAVENOUS | Status: AC
  Administered 2017-05-24 (×2): 2 g via INTRAVENOUS
  Filled 2017-05-24 (×2): qty 100

## 2017-05-24 MED ORDER — METHOCARBAMOL 500 MG PO TABS
500.0000 mg | ORAL_TABLET | Freq: Four times a day (QID) | ORAL | Status: DC | PRN
  Administered 2017-05-24 – 2017-05-25 (×2): 500 mg via ORAL
  Filled 2017-05-24 (×2): qty 1

## 2017-05-24 MED ORDER — METFORMIN HCL 500 MG PO TABS
500.0000 mg | ORAL_TABLET | Freq: Two times a day (BID) | ORAL | Status: DC
  Administered 2017-05-24 – 2017-05-25 (×2): 500 mg via ORAL
  Filled 2017-05-24 (×2): qty 1

## 2017-05-24 MED ORDER — STERILE WATER FOR IRRIGATION IR SOLN
Status: DC | PRN
  Administered 2017-05-24: 2000 mL

## 2017-05-24 MED ORDER — FENTANYL CITRATE (PF) 100 MCG/2ML IJ SOLN
INTRAMUSCULAR | Status: AC
  Filled 2017-05-24: qty 2

## 2017-05-24 MED ORDER — PHENOL 1.4 % MT LIQD: 1.0000 | OROMUCOSAL | Status: DC | PRN

## 2017-05-24 MED ORDER — ASPIRIN 81 MG PO CHEW
81.0000 mg | CHEWABLE_TABLET | Freq: Two times a day (BID) | ORAL | Status: DC
  Administered 2017-05-24 – 2017-05-25 (×2): 81 mg via ORAL
  Filled 2017-05-24 (×2): qty 1

## 2017-05-24 MED ORDER — ASPIRIN 81 MG PO CHEW: 81.0000 mg | CHEWABLE_TABLET | Freq: Two times a day (BID) | ORAL | 0 refills | Status: AC

## 2017-05-24 MED ORDER — MEPERIDINE HCL 50 MG/ML IJ SOLN: 6.2500 mg | INTRAMUSCULAR | Status: DC | PRN

## 2017-05-24 MED ORDER — CITALOPRAM HYDROBROMIDE 20 MG PO TABS
40.0000 mg | ORAL_TABLET | Freq: Every day | ORAL | Status: DC
  Administered 2017-05-25: 40 mg via ORAL
  Filled 2017-05-24: qty 2

## 2017-05-24 MED ORDER — BUPIVACAINE HCL (PF) 0.5 % IJ SOLN
INTRAMUSCULAR | Status: DC | PRN
  Administered 2017-05-24: 15 mg via INTRATHECAL

## 2017-05-24 MED ORDER — MAGNESIUM CITRATE PO SOLN: 1.0000 | Freq: Once | ORAL | Status: DC | PRN

## 2017-05-24 MED ORDER — HYDROMORPHONE HCL-NACL 0.5-0.9 MG/ML-% IV SOSY
0.2500 mg | PREFILLED_SYRINGE | INTRAVENOUS | Status: DC | PRN
  Administered 2017-05-24: 0.5 mg via INTRAVENOUS

## 2017-05-24 SURGICAL SUPPLY — 37 items
BAG DECANTER FOR FLEXI CONT (MISCELLANEOUS) ×2 IMPLANT
BAG ZIPLOCK 12X15 (MISCELLANEOUS) ×2 IMPLANT
BLADE SAG 18X100X1.27 (BLADE) ×2 IMPLANT
CAPT HIP TOTAL 2 ×2 IMPLANT
CLOTH BEACON ORANGE TIMEOUT ST (SAFETY) ×2 IMPLANT
COVER PERINEAL POST (MISCELLANEOUS) ×2 IMPLANT
COVER SURGICAL LIGHT HANDLE (MISCELLANEOUS) ×2 IMPLANT
DERMABOND ADVANCED (GAUZE/BANDAGES/DRESSINGS) ×1
DERMABOND ADVANCED .7 DNX12 (GAUZE/BANDAGES/DRESSINGS) ×1 IMPLANT
DRAPE STERI IOBAN 125X83 (DRAPES) ×2 IMPLANT
DRAPE U-SHAPE 47X51 STRL (DRAPES) ×4 IMPLANT
DRESSING AQUACEL AG SP 3.5X10 (GAUZE/BANDAGES/DRESSINGS) ×1 IMPLANT
DRSG AQUACEL AG SP 3.5X10 (GAUZE/BANDAGES/DRESSINGS) ×2
DRSG TEGADERM 2-3/8X2-3/4 SM (GAUZE/BANDAGES/DRESSINGS) ×2 IMPLANT
DURAPREP 26ML APPLICATOR (WOUND CARE) ×2 IMPLANT
ELECT REM PT RETURN 15FT ADLT (MISCELLANEOUS) ×2 IMPLANT
GLOVE BIOGEL PI IND STRL 7.5 (GLOVE) ×4 IMPLANT
GLOVE BIOGEL PI IND STRL 8.5 (GLOVE) ×1 IMPLANT
GLOVE BIOGEL PI INDICATOR 7.5 (GLOVE) ×4
GLOVE BIOGEL PI INDICATOR 8.5 (GLOVE) ×1
GLOVE ECLIPSE 8.0 STRL XLNG CF (GLOVE) ×2 IMPLANT
GLOVE ORTHO TXT STRL SZ7.5 (GLOVE) ×4 IMPLANT
GLOVE SURG SS PI 7.5 STRL IVOR (GLOVE) ×4 IMPLANT
GOWN SPEC L3 XXLG W/TWL (GOWN DISPOSABLE) ×2 IMPLANT
GOWN STRL REUS W/ TWL XL LVL3 (GOWN DISPOSABLE) ×1 IMPLANT
GOWN STRL REUS W/TWL LRG LVL3 (GOWN DISPOSABLE) ×2 IMPLANT
GOWN STRL REUS W/TWL XL LVL3 (GOWN DISPOSABLE) ×3 IMPLANT
HOLDER FOLEY CATH W/STRAP (MISCELLANEOUS) ×2 IMPLANT
PACK ANTERIOR HIP CUSTOM (KITS) ×2 IMPLANT
SUT MNCRL AB 4-0 PS2 18 (SUTURE) ×2 IMPLANT
SUT STRATAFIX 0 PDS 27 VIOLET (SUTURE) ×2
SUT VIC AB 1 CT1 36 (SUTURE) ×6 IMPLANT
SUT VIC AB 2-0 CT1 27 (SUTURE) ×2
SUT VIC AB 2-0 CT1 TAPERPNT 27 (SUTURE) ×2 IMPLANT
SUTURE STRATFX 0 PDS 27 VIOLET (SUTURE) ×1 IMPLANT
TRAY FOLEY W/METER SILVER 16FR (SET/KITS/TRAYS/PACK) ×2 IMPLANT
YANKAUER SUCT BULB TIP 10FT TU (MISCELLANEOUS) ×2 IMPLANT

## 2017-05-24 NOTE — Anesthesia Procedure Notes (Signed)
Spinal  Patient location during procedure: OR Start time: 05/24/2017 7:17 AM End time: 05/24/2017 7:19 AM Staffing Resident/CRNA: Harle Stanford R Performed: resident/CRNA  Preanesthetic Checklist Completed: patient identified, site marked, surgical consent, pre-op evaluation, timeout performed, IV checked, risks and benefits discussed and monitors and equipment checked Spinal Block Patient position: sitting Prep: Betadine Patient monitoring: heart rate Approach: midline Location: L3-4 Injection technique: single-shot Needle Needle type: Pencan  Needle gauge: 24 G Needle length: 10 cm Needle insertion depth: 7 cm Assessment Sensory level: T6 Additional Notes Timeout performed. SAB kit date checked. SAB without difficulty

## 2017-05-24 NOTE — Transfer of Care (Signed)
Immediate Anesthesia Transfer of Care Note  Patient: Corey Collins  Procedure(s) Performed: Procedure(s): RIGHT TOTAL HIP ARTHROPLASTY ANTERIOR APPROACH (Right)  Patient Location: PACU  Anesthesia Type:Spinal  Level of Consciousness: sedated  Airway & Oxygen Therapy: Patient Spontanous Breathing and Patient connected to face mask oxygen  Post-op Assessment: Report given to RN and Post -op Vital signs reviewed and stable  Post vital signs: Reviewed and stable  Last Vitals:  Vitals:   05/24/17 0516  BP: (!) 148/75  Pulse: 65  Resp: 18    Last Pain:  Vitals:   05/24/17 0555  TempSrc:   PainSc: 5       Patients Stated Pain Goal: 3 (05/24/17 0555)  Complications: No apparent anesthesia complications

## 2017-05-24 NOTE — Interval H&P Note (Signed)
History and Physical Interval Note:  05/24/2017 6:58 AM  Corey Collins  has presented today for surgery, with the diagnosis of Right hip osteoarthritis  The various methods of treatment have been discussed with the patient and family. After consideration of risks, benefits and other options for treatment, the patient has consented to  Procedure(s): RIGHT TOTAL HIP ARTHROPLASTY ANTERIOR APPROACH (Right) as a surgical intervention .  The patient's history has been reviewed, patient examined, no change in status, stable for surgery.  I have reviewed the patient's chart and labs.  Questions were answered to the patient's satisfaction.     [name],[name]

## 2017-05-24 NOTE — Anesthesia Postprocedure Evaluation (Signed)
Anesthesia Post Note  Patient: Corey Collins  Procedure(s) Performed: Procedure(s) (LRB): RIGHT TOTAL HIP ARTHROPLASTY ANTERIOR APPROACH (Right)     Patient location during evaluation: PACU Anesthesia Type: Spinal Level of consciousness: oriented and awake and alert Pain management: pain level controlled Vital Signs Assessment: post-procedure vital signs reviewed and stable Respiratory status: spontaneous breathing, respiratory function stable and patient connected to nasal cannula oxygen Cardiovascular status: blood pressure returned to baseline and stable Postop Assessment: no headache, no backache and spinal receding Anesthetic complications: no    Last Vitals:  Vitals:   05/24/17 1325 05/24/17 1436  BP: 128/71 105/76  Pulse: 69 66  Resp: 16 16  Temp: 36.7 C 36.4 C                  Shelton SilvasKevin D Laconda Basich

## 2017-05-24 NOTE — Op Note (Signed)
NAME:  Corey Collins                ACCOUNT NO.: [id number]      MEDICAL RECORD NO.: [id number]      FACILITY:  [facility]      PHYSICIAN:  Durene Romans D  DATE OF BIRTH:  Jul 15, 1961     DATE OF PROCEDURE:  05/24/2017                                 OPERATIVE REPORT         PREOPERATIVE DIAGNOSIS: Right  hip osteoarthritis.      POSTOPERATIVE DIAGNOSIS:  Right hip osteoarthritis. History of recent left total hip replacement     PROCEDURE:  Right total hip replacement through an anterior approach   utilizing DePuy THR system, component size 54mm pinnacle cup, a size 36+4 neutral   Altrex liner, a size 5 hi Tri Lock stem with a 36+1.5 delta ceramic   ball.      SURGEON:  Madlyn Frankel. Charlann Boxer, M.D.      ASSISTANT:  Lanney Gins, PA-C     ANESTHESIA:  Spinal.      SPECIMENS:  None.      COMPLICATIONS:  None.      BLOOD LOSS:  700 cc     DRAINS:  None.      INDICATION OF THE PROCEDURE:  Corey Collins is a 56 y.o. male who had   presented to office for evaluation of right hip pain.  Radiographs revealed   progressive degenerative changes with bone-on-bone   articulation to the  hip joint.  The patient had painful limited range of   motion significantly affecting their overall quality of life.  The patient was failing to    respond to conservative measures, and at this point was ready   to proceed with more definitive measures.  The patient has noted progressive   degenerative changes in his hip, progressive problems and dysfunction   with regarding the hip prior to surgery.  Consent was obtained for   benefit of pain relief.  Specific risk of infection, DVT, component   failure, dislocation, need for revision surgery, as well discussion of   the anterior versus posterior approach were reviewed.  Consent was   obtained for benefit of anterior pain relief through an anterior   approach.      PROCEDURE IN DETAIL:  The patient was brought to  operative theater.   Once adequate anesthesia, preoperative antibiotics, 2 gm of Ancef, 10 mg of Decadron (TXA, 2 gm given topically) administered.   The patient was positioned supine on the OSI Hanna table.  Once adequate   padding of boney process was carried out, we had predraped out the hip, and  used fluoroscopy to confirm orientation of the pelvis and position.      The right hip was then prepped and draped from proximal iliac crest to   mid thigh with shower curtain technique.      Time-out was performed identifying the patient, planned procedure, and   extremity.     An incision was then made 2 cm distal and lateral to the   anterior superior iliac spine extending over the orientation of the   tensor fascia lata muscle and sharp dissection was carried down to the   fascia of the muscle and protractor placed in the soft tissues.  The fascia was then incised.  The muscle belly was identified and swept   laterally and retractor placed along the superior neck.  Following   cauterization of the circumflex vessels and removing some pericapsular   fat, a second cobra retractor was placed on the inferior neck.  A third   retractor was placed on the anterior acetabulum after elevating the   anterior rectus.  A L-capsulotomy was along the line of the   superior neck to the trochanteric fossa, then extended proximally and   distally.  Tag sutures were placed and the retractors were then placed   intracapsular.  We then identified the trochanteric fossa and   orientation of my neck cut, confirmed this radiographically   and then made a neck osteotomy with the femur on traction.  The femoral   head was removed without difficulty or complication.  Traction was let   off and retractors were placed posterior and anterior around the   acetabulum.      The labrum and foveal tissue were debrided.  I began reaming with a 47mm   reamer and reamed up to 53mm reamer with good bony bed  preparation and a 54mm   cup was chosen.  The final 54mm Pinnacle cup was then impacted under fluoroscopy  to confirm the depth of penetration and orientation with respect to   abduction.  A screw was placed followed by the hole eliminator.  The final   36+4 neutral Altrex liner was impacted with good visualized rim fit.  The cup was positioned anatomically within the acetabular portion of the pelvis.      At this point, the femur was rolled at 80 degrees.  Further capsule was   released off the inferior aspect of the femoral neck.  I then   released the superior capsule proximally.  The hook was placed laterally   along the femur and elevated manually and held in position with the bed   hook.  The leg was then extended and adducted with the leg rolled to 100   degrees of external rotation.  Once the proximal femur was fully   exposed, I used a box osteotome to set orientation.  I then began   broaching with the starting chili pepper broach and passed this by hand and then broached up to 5.  With the 5 broach in place I chose a high offset neck and did trial reductions.  The offset was appropriate, leg lengths   appeared to be equal best matched to the contralateral left THR with the +1.5 head ball confirmed radiographically.   Given these findings, I went ahead and dislocated the hip, repositioned all   retractors and positioned the right hip in the extended and abducted position.  The final 5 Hi Tri Lock stem was   chosen and it was impacted down to the level of neck cut.  Based on this   and the trial reduction, a 36+1.5 delta ceramic ball was chosen and   impacted onto a clean and dry trunnion, and the hip was reduced.  The   hip had been irrigated throughout the case again at this point.  I did   reapproximate the superior capsular leaflet to the anterior leaflet   using #1 Vicryl.  The fascia of the   tensor fascia lata muscle was then reapproximated using #1 Vicryl and #0 Stratafix  sutures.  The   remaining wound was closed with 2-0 Vicryl and running 4-0 Monocryl.  The hip was cleaned, dried, and dressed sterilely using Dermabond and   Aquacel dressing.  She was then brought   to recovery room in stable condition tolerating the procedure well.    Lanney GinsMatthew Babish, PA-C was present for the entirety of the case involved from   preoperative positioning, perioperative retractor management, general   facilitation of the case, as well as primary wound closure as assistant.            Madlyn FrankelMatthew D. Charlann Boxerlin, M.D.        05/24/2017 8:37 AM

## 2017-05-24 NOTE — Discharge Instructions (Signed)

## 2017-05-24 NOTE — Evaluation (Signed)
Physical Therapy Evaluation Patient Details Name: Corey PeerChristopher R Collins MRN: [id number] DOB: 11-Jan-1961 Today's Date: 05/24/2017   History of Present Illness  Pt is a 56 year old s/p R THA direct anterior with hx of L DA THA in March 2018  Clinical Impression  Pt is s/p THA resulting in the deficits listed below (see PT Problem List).  Pt will benefit from skilled PT to increase their independence and safety with mobility to allow discharge to the venue listed below.  Pt assisted with ambulating short distance POD #0 and plans to d/c to friend's home one level since his home as steps.     Follow Up Recommendations No PT follow up;DC plan and follow up therapy as arranged by surgeon    Equipment Recommendations  None recommended by PT    Recommendations for Other Services       Precautions / Restrictions Precautions Precautions: Fall;None Restrictions Other Position/Activity Restrictions: WBAT      Mobility  Bed Mobility Overal bed mobility: Needs Assistance Bed Mobility: Supine to Sit     Supine to sit: Min guard     General bed mobility comments: verbal cues for technique, increased time  Transfers Overall transfer level: Needs assistance Equipment used: Rolling walker (2 wheeled) Transfers: Sit to/from Stand Sit to Stand: Min assist;From elevated surface         General transfer comment: verbal cues for UE and LE positioning, assist to rise and steady  Ambulation/Gait Ambulation/Gait assistance: Min guard Ambulation Distance (Feet): 60 Feet Assistive device: Rolling walker (2 wheeled) Gait Pattern/deviations: Step-to pattern;Decreased stance time - right;Antalgic     General Gait Details: verbal cues for sequence, RW positioning, step length, posture  Stairs            Wheelchair Mobility    Modified Rankin (Stroke Patients Only)       Balance                                             Pertinent Vitals/Pain Pain  Assessment: 0-10 Pain Score: 6  Pain Location: R hip Pain Descriptors / Indicators: Aching;Sore Pain Intervention(s): Monitored during session;Limited activity within patient's tolerance;Repositioned;Ice applied;Premedicated before session    Home Living Family/patient expects to be discharged to:: Private residence Living Arrangements: Alone Available Help at Discharge: Friend(s) Type of Home: House Home Access: Stairs to enter Entrance Stairs-Rails: None Entrance Stairs-Number of Steps: 2 Home Layout: Able to live on main level with bedroom/bathroom Home Equipment: Cane - single point;Walker - 2 wheels;Bedside commode Additional Comments: plans to stay with friends on 1 level for a few days, then goes to his apt with many steps    Prior Function Level of Independence: Independent               Hand Dominance        Extremity/Trunk Assessment        Lower Extremity Assessment Lower Extremity Assessment: RLE deficits/detail RLE Deficits / Details: anticipated post op hip weakness, able to perform ankle DF/PF       Communication   Communication: No difficulties  Cognition Arousal/Alertness: Awake/alert Behavior During Therapy: WFL for tasks assessed/performed Overall Cognitive Status: Within Functional Limits for tasks assessed  General Comments      Exercises     Assessment/Plan    PT Assessment Patient needs continued PT services  PT Problem List Decreased strength;Decreased mobility;Pain       PT Treatment Interventions Functional mobility training;Stair training;Gait training;Therapeutic exercise;DME instruction;Therapeutic activities;Patient/family education    PT Goals (Current goals can be found in the Care Plan section)  Acute Rehab PT Goals PT Goal Formulation: With patient Time For Goal Achievement: 05/28/17 Potential to Achieve Goals: Good    Frequency 7X/week   Barriers to  discharge        Co-evaluation               AM-PAC PT "6 Clicks" Daily Activity  Outcome Measure Difficulty turning over in bed (including adjusting bedclothes, sheets and blankets)?: None Difficulty moving from lying on back to sitting on the side of the bed? : A Lot Difficulty sitting down on and standing up from a chair with arms (e.g., wheelchair, bedside commode, etc,.)?: Total Help needed moving to and from a bed to chair (including a wheelchair)?: Total Help needed walking in hospital room?: A Little Help needed climbing 3-5 steps with a railing? : A Little 6 Click Score: 14    End of Session Equipment Utilized During Treatment: Gait belt Activity Tolerance: Patient tolerated treatment well Patient left: in chair;with call bell/phone within reach   PT Visit Diagnosis: Difficulty in walking, not elsewhere classified (R26.2);Pain Pain - Right/Left: Right Pain - part of body: Hip    Time: 9604-5409 PT Time Calculation (min) (ACUTE ONLY): 18 min   Charges:   PT Evaluation $PT Eval Low Complexity: 1 Procedure     PT G Codes:        Zenovia Jarred, PT, DPT 05/24/2017 Pager: [phone]   Maida Sale E 05/24/2017, 4:44 PM

## 2017-05-25 DIAGNOSIS — E663 Overweight: Secondary | ICD-10-CM | POA: Diagnosis present

## 2017-05-25 LAB — CBC
HCT: 28.5 % — ABNORMAL LOW (ref 39.0–52.0)
Hemoglobin: 9.9 g/dL — ABNORMAL LOW (ref 13.0–17.0)
MCH: 30.2 pg (ref 26.0–34.0)
MCHC: 34.7 g/dL (ref 30.0–36.0)
MCV: 86.9 fL (ref 78.0–100.0)
PLATELETS: 202 10*3/uL (ref 150–400)
RBC: 3.28 MIL/uL — ABNORMAL LOW (ref 4.22–5.81)
RDW: 13.2 % (ref 11.5–15.5)
WBC: 14.1 10*3/uL — ABNORMAL HIGH (ref 4.0–10.5)

## 2017-05-25 LAB — BASIC METABOLIC PANEL
Anion gap: 7 (ref 5–15)
BUN: 12 mg/dL (ref 6–20)
CALCIUM: 8.1 mg/dL — AB (ref 8.9–10.3)
CO2: 26 mmol/L (ref 22–32)
CREATININE: 0.58 mg/dL — AB (ref 0.61–1.24)
Chloride: 104 mmol/L (ref 101–111)
GFR calc Af Amer: >60 mL/min (ref >60)
Glucose, Bld: 142 mg/dL — ABNORMAL HIGH (ref 65–99)
Potassium: 3.9 mmol/L (ref 3.5–5.1)
SODIUM: 137 mmol/L (ref 135–145)

## 2017-05-25 LAB — GLUCOSE, CAPILLARY: GLUCOSE-CAPILLARY: 114 mg/dL — AB (ref 65–99)

## 2017-05-25 NOTE — Evaluation (Signed)
Occupational Therapy Evaluation Patient Details Name: Corey Collins MRN: [id number] DOB: Jan 12, 1961 Today's Date: 05/25/2017    History of Present Illness Pt is a 56 year old s/p R THA direct anterior with hx of L DA THA in March 2018   Clinical Impression   Pt reports she was independent with ADL PTA. Currently pt supervision for functional mobility and ADL with the exception of min assist for LB dressing. Began ADL and safety education with pt. Pt planning to d/c home with support from friends. Pt would benefit from continued skilled OT to address established goals.    Follow Up Recommendations  No OT follow up;Supervision - Intermittent    Equipment Recommendations  None recommended by OT    Recommendations for Other Services       Precautions / Restrictions Precautions Precautions: Fall;None Precaution Comments: direct anterior THA Restrictions Weight Bearing Restrictions: Yes RLE Weight Bearing: Weight bearing as tolerated Other Position/Activity Restrictions: WBAT      Mobility Bed Mobility Overal bed mobility: Needs Assistance Bed Mobility: Supine to Sit     Supine to sit: Supervision;HOB elevated     General bed mobility comments: Pt OOB in chair upon arrival  Transfers Overall transfer level: Needs assistance Equipment used: Rolling walker (2 wheeled) Transfers: Sit to/from Stand Sit to Stand: Supervision         General transfer comment: Good hand placement and technique. Supervision for safety    Balance Overall balance assessment: Needs assistance Sitting-balance support: Feet supported;No upper extremity supported Sitting balance-Leahy Scale: Good     Standing balance support: No upper extremity supported;During functional activity Standing balance-Leahy Scale: Fair                             ADL either performed or assessed with clinical judgement   ADL Overall ADL's : Needs assistance/impaired Eating/Feeding:  Independent;Sitting   Grooming: Supervision/safety;Wash/dry face;Wash/dry hands;Standing Grooming Details (indicate cue type and reason): and shave Upper Body Bathing: Set up;Sitting   Lower Body Bathing: Supervison/ safety;Sit to/from stand   Upper Body Dressing : Set up;Sitting   Lower Body Dressing: Minimal assistance;Sit to/from stand Lower Body Dressing Details (indicate cue type and reason): Min assist for RLE into clothing. Educated on compensatory strategies for LB ADL. Recommended sitting for getting clothing over feet Toilet Transfer: Supervision/safety;Ambulation;Regular Toilet;RW Toilet Transfer Details (indicate cue type and reason): Simulated by sit to stand from chair with functional mobility to bathroom         Functional mobility during ADLs: Supervision/safety;Rolling walker       Vision         Perception     Praxis      Pertinent Vitals/Pain Pain Assessment: Faces Pain Score: 3  Faces Pain Scale: Hurts a little bit Pain Location: R hip with mobility Pain Descriptors / Indicators: Aching;Sore Pain Intervention(s): Monitored during session;Repositioned     Hand Dominance     Extremity/Trunk Assessment Upper Extremity Assessment Upper Extremity Assessment: Overall WFL for tasks assessed (reports tingling in 1st and 2nd digit bilaterally )   Lower Extremity Assessment Lower Extremity Assessment: Defer to PT evaluation   Cervical / Trunk Assessment Cervical / Trunk Assessment: Normal   Communication Communication Communication: No difficulties   Cognition Arousal/Alertness: Awake/alert Behavior During Therapy: WFL for tasks assessed/performed Overall Cognitive Status: Within Functional Limits for tasks assessed  General Comments       Exercises    Shoulder Instructions      Home Living Family/patient expects to be discharged to:: Private residence Living Arrangements: Alone Available  Help at Discharge: Friend(s) Type of Home: House Home Access: Stairs to enter Enter[employer] CorporationEntrance Stairs-Number of Steps: 2 Entrance Stairs-Rails: None Home Layout: Able to live on main level with bedroom/bathroom;Two level     Bathroom Shower/Tub: Chief Strategy OfficerTub/shower unit   Bathroom Toilet: Handicapped height     Home Equipment: Cane - single point;Walker - 2 wheels;Bedside commode          Prior Functioning/Environment Level of Independence: Independent                 OT Problem List: Decreased range of motion;Impaired balance (sitting and/or standing);Decreased knowledge of use of DME or AE;Decreased knowledge of precautions;Pain      OT Treatment/Interventions: Self-care/ADL training;DME and/or AE instruction;Therapeutic activities;Patient/family education;Balance training    OT Goals(Current goals can be found in the care plan section) Acute Rehab OT Goals Patient Stated Goal: go to friends house then home OT Goal Formulation: With patient Time For Goal Achievement: 06/08/17 Potential to Achieve Goals: Good ADL Goals Pt Will Perform Lower Body Bathing: with modified independence;sit to/from stand Pt Will Perform Lower Body Dressing: with modified independence;sit to/from stand Pt Will Perform Tub/Shower Transfer: Tub transfer;with modified independence;ambulating;rolling walker  OT Frequency: Min 2X/week   Barriers to D/C:            Co-evaluation              AM-PAC PT "6 Clicks" Daily Activity     Outcome Measure Help from another person eating meals?: None Help from another person taking care of personal grooming?: A Little Help from another person toileting, which includes using toliet, bedpan, or urinal?: A Little Help from another person bathing (including washing, rinsing, drying)?: A Little Help from another person to put on and taking off regular upper body clothing?: None Help from another person to put on and taking off regular lower body clothing?: A  Little 6 Click Score: 20   End of Session Equipment Utilized During Treatment: Rolling walker  Activity Tolerance: Patient tolerated treatment well Patient left: in chair;with call bell/phone within reach  OT Visit Diagnosis: Other abnormalities of gait and mobility (R26.89)                Time: 4098-11911126-1200 OT Time Calculation (min): 34 min Charges:  OT General Charges $OT Visit: 1 Procedure OT Evaluation $OT Eval Moderate Complexity: 1 Procedure OT Treatments $Self Care/Home Management : 8-22 mins G-Codes:     Fredric MareBailey A. Brett Albinooffey, M.S., OTR/L Pager: [phone]  Gaye AlkenBailey A Eshal Propps 05/25/2017, 12:02 PM

## 2017-05-25 NOTE — Progress Notes (Signed)
     Subjective: 1 Day Post-Op Procedure(s) (LRB): RIGHT TOTAL HIP ARTHROPLASTY ANTERIOR APPROACH (Right)   Patient reports pain as mild, pain controlled. Feels better at this point as compared to the surgery on the left hip. Feels that they will progress even faster with the recovery of this hip compared to the left hip.  Ready to be discharged home after PT.  Objective:   VITALS:   Vitals:   05/24/17 2126 05/25/17 0619  BP: (!) 114/48 (!) 109/52  Pulse: (!) 58 (!) 53  Resp: 16 16  Temp: 98.2 F (36.8 C) 97.6 F (36.4 C)    Dorsiflexion/Plantar flexion intact Incision: dressing C/D/I No cellulitis present Compartment soft  LABS  Recent Labs  05/25/17 0535  HGB 9.9*  HCT 28.5*  WBC 14.1*  PLT 202     Recent Labs  05/25/17 0535  NA 137  K 3.9  BUN 12  CREATININE 0.58*  GLUCOSE 142*     Assessment/Plan: 1 Day Post-Op Procedure(s) (LRB): RIGHT TOTAL HIP ARTHROPLASTY ANTERIOR APPROACH (Right) Advance diet Up with therapy D/C IV fluids Discharge home [facility] Follow up with OLIN,Daisa Stennis D in 2 weeks.  Contact information:  [facility] [address] [place] [id number] [phone]    Overweight (BMI 25-29.9) Estimated body mass index is 27.76 kg/m as calculated from the following:   Height as of this encounter: 5\' 9"  (1.753 m).   Weight as of this encounter: 85.3 kg (188 lb). Patient also counseled that weight may inhibit the healing process Patient counseled that losing weight will help with future health issues        Anastasio AuerbachMatthew S. Tanishka Drolet   PAC  05/25/2017, 8:59 AM

## 2017-05-25 NOTE — Progress Notes (Signed)
Physical Therapy Treatment Patient Details Name: Corey Collins MRN: [id number] DOB: November 12, 1960 Today's Date: 05/25/2017    History of Present Illness Pt is a 57 year old s/p R THA direct anterior with hx of L DA THA in March 2018    PT Comments    Pt ambulated in hallway and practiced safe stair technique.  Pt performing mobility well and anticipate good progress.  Pt also educated on exercises and provided HEP handout.  Pt had no further questions and feels ready for d/c home today.   Follow Up Recommendations  No PT follow up;DC plan and follow up therapy as arranged by surgeon     Equipment Recommendations  None recommended by PT    Recommendations for Other Services       Precautions / Restrictions Precautions Precautions: Fall;None Restrictions Weight Bearing Restrictions: No Other Position/Activity Restrictions: WBAT    Mobility  Bed Mobility Overal bed mobility: Needs Assistance Bed Mobility: Supine to Sit     Supine to sit: Supervision;HOB elevated        Transfers Overall transfer level: Needs assistance Equipment used: Rolling walker (2 wheeled) Transfers: Sit to/from Stand Sit to Stand: Supervision         General transfer comment: verbal cues for UE and LE positioning  Ambulation/Gait Ambulation/Gait assistance: Min guard;Supervision Ambulation Distance (Feet): 160 Feet Assistive device: Rolling walker (2 wheeled) Gait Pattern/deviations: Decreased stance time - right;Antalgic;Step-through pattern     General Gait Details: verbal cues for sequence, RW positioning, step length, posture   Stairs Stairs: Yes   Stair Management: Forwards;Step to pattern;Two rails Number of Stairs: 3 General stair comments: verbal cues for sequence and safety, pt performed well, also performed twice  Wheelchair Mobility    Modified Rankin (Stroke Patients Only)       Balance                                             Cognition Arousal/Alertness: Awake/alert Behavior During Therapy: WFL for tasks assessed/performed Overall Cognitive Status: Within Functional Limits for tasks assessed                                        Exercises Total Joint Exercises Hip ABduction/ADduction: AROM;Right;10 reps;Standing Long Arc Quad: AROM;Right;10 reps;Seated Knee Flexion: AROM;Right;10 reps;Standing Marching in Standing: AROM;Right;10 reps;Standing Standing Hip Extension: AROM;Right;10 reps;Standing    General Comments        Pertinent Vitals/Pain Pain Assessment: 0-10 Pain Score: 3  Pain Location: R hip Pain Descriptors / Indicators: Aching;Sore Pain Intervention(s): Limited activity within patient's tolerance;Monitored during session;Repositioned;Ice applied    Home Living                      Prior Function            PT Goals (current goals can now be found in the care plan section) Progress towards PT goals: Progressing toward goals    Frequency    7X/week      PT Plan Current plan remains appropriate    Co-evaluation              AM-PAC PT "6 Clicks" Daily Activity  Outcome Measure  Difficulty turning over in bed (including adjusting bedclothes, sheets and blankets)?: None Difficulty moving  from lying on back to sitting on the side of the bed? : None Difficulty sitting down on and standing up from a chair with arms (e.g., wheelchair, bedside commode, etc,.)?: A Little Help needed moving to and from a bed to chair (including a wheelchair)?: A Little Help needed walking in hospital room?: A Little Help needed climbing 3-5 steps with a railing? : A Little 6 Click Score: 20    End of Session Equipment Utilized During Treatment: Gait belt Activity Tolerance: Patient tolerated treatment well Patient left: in chair;with call bell/phone within reach Nurse Communication: Mobility status PT Visit Diagnosis: Difficulty in walking, not elsewhere  classified (R26.2);Pain Pain - Right/Left: Right Pain - part of body: Hip     Time: [id number] PT Time Calculation (min) (ACUTE ONLY): 20 min  Charges:  $Gait Training: 8-22 mins                    G Codes:      Zenovia JarredKati Timi Reeser, PT, DPT 05/25/2017 Pager: [phone]   Maida SaleLEMYRE,KATHrine E 05/25/2017, 11:10 AM

## 2017-05-26 NOTE — Discharge Summary (Signed)
Physician Discharge Summary  Patient ID: Corey PeerChristopher R Connery MRN: [id number] DOB/AGE: 1961-09-16 56 y.o.  Admit date: 05/24/2017 Discharge date: 05/25/2017   Procedures:  Procedure(s) (LRB): RIGHT TOTAL HIP ARTHROPLASTY ANTERIOR APPROACH (Right)  Attending Physician:  Dr. Durene RomansMatthew Olin   Admission Diagnoses:   Right hip primary OA / pain  Discharge Diagnoses:  Principal Problem:   S/P right THA, AA Active Problems:   Overweight (BMI 25.0-29.9)  Past Medical History:  Diagnosis Date  . Arthritis    oa  . Asthma    eith cats  . Borderline diabetes   . DVT (deep venous thrombosis) (HCC) 2009   L calf  . Headache    sinus  . History of kidney stones   . Pneumonia age 56 or [age]  . PTSD (post-traumatic stress disorder)   . Puncture wound    2010 left lower leg   . Seasonal allergies     HPI:    Corey Collins, 56 y.o. male, has a history of pain and functional disability in the right hip(s) due to arthritis and patient has failed non-surgical conservative treatments for greater than 12 weeks to include NSAID's and/or analgesics and activity modification.  Onset of symptoms was gradual starting  years ago with gradually worsening course since that time.The patient noted prior procedures of the hip to include arthroplasty on the left hip, by Dr. Charlann Boxerlin (02/01/2017).  Patient currently rates pain in the right hip at 8 out of 10 with activity. Patient has worsening of pain with activity and weight bearing, trendelenberg gait, pain that interfers with activities of daily living and pain with passive range of motion. Patient has evidence of periarticular osteophytes and joint space narrowing by imaging studies. This condition presents safety issues increasing the risk of falls.  There is no current active infection.   Risks, benefits and expectations were discussed with the patient.  Risks including but not limited to the risk of anesthesia, blood clots, nerve damage, blood vessel  damage, failure of the prosthesis, infection and up to and including death.  Patient understand the risks, benefits and expectations and wishes to proceed with surgery.   PCP: Willow OraAndy, Camille L, MD   Discharged Condition: good  Hospital Course:  Patient underwent the above stated procedure on 05/24/2017. Patient tolerated the procedure well and brought to the recovery room in good condition and subsequently to the floor.  POD #1 BP: 109/52 ; Pulse: 53 ; Temp: 97.6 F (36.4 C) ; Resp: 16 Patient reports pain as mild, pain controlled. Feels better at this point as compared to the surgery on the left hip. Feels that they will progress even faster with the recovery of this hip compared to the left hip.  Ready to be discharged home after PT. Dorsiflexion/plantar flexion intact, incision: dressing C/D/I, no cellulitis present and compartment soft.   LABS  Basename    HGB     9.9  HCT     28.5    Discharge Exam: General appearance: alert, cooperative and no distress Extremities: Homans sign is negative, no sign of DVT, no edema, redness or tenderness in the calves or thighs and no ulcers, gangrene or trophic changes  Disposition: Home with follow up in 2 weeks   Follow-up Information    Durene Romanslin, Ivie Savitt, MD. Schedule an appointment as soon as possible for a visit in 2 week(s).   Specialty:  Orthopedic Surgery Contact information: [address] [place] [phone]  Discharge Instructions    Call MD / Call 911    Complete by:  As directed    If you experience chest pain or shortness of breath, CALL 911 and be transported to the hospital emergency room.  If you develope a fever above 101 F, pus (white drainage) or increased drainage or redness at the wound, or calf pain, call your surgeon's office.   Change dressing    Complete by:  As directed    Maintain surgical dressing until follow up in the clinic. If the edges start to pull up, may  reinforce with tape. If the dressing is no longer working, may remove and cover with gauze and tape, but must keep the area dry and clean.  Call with any questions or concerns.   Constipation Prevention    Complete by:  As directed    Drink plenty of fluids.  Prune juice may be helpful.  You may use a stool softener, such as Colace (over the counter) 100 mg twice a day.  Use MiraLax (over the counter) for constipation as needed.   Diet - low sodium heart healthy    Complete by:  As directed    Discharge instructions    Complete by:  As directed    Maintain surgical dressing until follow up in the clinic. If the edges start to pull up, may reinforce with tape. If the dressing is no longer working, may remove and cover with gauze and tape, but must keep the area dry and clean.  Follow up in 2 weeks at [facility]. Call with any questions or concerns.   Increase activity slowly as tolerated    Complete by:  As directed    Weight bearing as tolerated with assist device (walker, cane, etc) as directed, use it as long as suggested by your surgeon or therapist, typically at least 4-6 weeks.   TED hose    Complete by:  As directed    Use stockings (TED hose) for 2 weeks on both leg(s).  You may remove them at night for sleeping.      Allergies as of 05/25/2017      Reactions   Sulfa Antibiotics Other (See Comments)   Family history, took in [place] in [address] did ok with      Medication List    STOP taking these medications   ibuprofen 200 MG tablet Commonly known as:  ADVIL,MOTRIN     TAKE these medications   albuterol 108 (90 Base) MCG/ACT inhaler Commonly known as:  PROVENTIL HFA;VENTOLIN HFA Inhale 2 puffs into the lungs every 6 (six) hours as needed for wheezing or shortness of breath.   amitriptyline 50 MG tablet Commonly known as:  ELAVIL Take 50 mg by mouth at bedtime as needed for sleep.   aspirin 81 MG chewable tablet Chew 1 tablet (81 mg total) by mouth 2 (two)  times daily. Take for 4 weeks.   citalopram 40 MG tablet Commonly known as:  CELEXA Take 40 mg by mouth daily.   [employer] PRENATAL GUMMY PO Take 2 tablets by mouth daily.   docusate sodium 100 MG capsule Commonly known as:  COLACE Take 1 capsule (100 mg total) by mouth 2 (two) times daily.   ferrous sulfate 325 (65 FE) MG tablet Commonly known as:  FERROUSUL Take 1 tablet (325 mg total) by mouth 3 (three) times daily with meals.   HYDROcodone-acetaminophen 7.5-325 MG tablet Commonly known as:  NORCO Take 1-2 tablets by mouth every 4 (  four) hours as needed for moderate pain or severe pain.   lithium carbonate 300 MG capsule Take 300 mg by mouth 3 (three) times daily.   metFORMIN 500 MG tablet Commonly known as:  GLUCOPHAGE Take 500 mg by mouth 2 (two) times daily with a meal.   methocarbamol 500 MG tablet Commonly known as:  ROBAXIN Take 1 tablet (500 mg total) by mouth every 6 (six) hours as needed for muscle spasms.   methylphenidate 10 MG tablet Commonly known as:  RITALIN Take 20 mg by mouth daily.   polyethylene glycol packet Commonly known as:  MIRALAX / GLYCOLAX Take 17 g by mouth 2 (two) times daily.        Signed: Anastasio Auerbach. Jema Deegan   PA-C  05/26/2017, 7:09 PM

## 2017-05-31 ENCOUNTER — Inpatient Hospital Stay (HOSPITAL_COMMUNITY): Admission: RE | Admit: 2017-05-31 | Payer: Medicare (Managed Care) | Source: Ambulatory Visit

## 2017-05-31 ENCOUNTER — Encounter (HOSPITAL_COMMUNITY): Payer: Medicare (Managed Care)

## 2017-05-31 ENCOUNTER — Other Ambulatory Visit (HOSPITAL_COMMUNITY): Payer: Self-pay | Admitting: Orthopedic Surgery

## 2017-05-31 DIAGNOSIS — M79605 Pain in left leg: Secondary | ICD-10-CM

## 2017-06-01 ENCOUNTER — Other Ambulatory Visit: Payer: Medicare (Managed Care)

## 2017-06-01 ENCOUNTER — Other Ambulatory Visit: Payer: Self-pay | Admitting: Orthopedic Surgery

## 2017-06-01 DIAGNOSIS — M25551 Pain in right hip: Secondary | ICD-10-CM

## 2017-06-02 ENCOUNTER — Ambulatory Visit
Admission: RE | Admit: 2017-06-02 | Discharge: 2017-06-02 | Disposition: A | Discharge status: Home / Self Care | Payer: Medicare (Managed Care) | Source: Ambulatory Visit | Attending: Orthopedic Surgery | Admitting: Orthopedic Surgery

## 2017-06-02 DIAGNOSIS — M25551 Pain in right hip: Secondary | ICD-10-CM

## 2017-10-11 ENCOUNTER — Encounter: Payer: Self-pay | Admitting: *Deleted

## 2017-10-11 ENCOUNTER — Ambulatory Visit: Payer: Medicare (Managed Care) | Admitting: Family Medicine

## 2017-10-11 DIAGNOSIS — F411 Generalized anxiety disorder: Secondary | ICD-10-CM | POA: Insufficient documentation

## 2017-10-13 ENCOUNTER — Encounter: Payer: Self-pay | Admitting: Family Medicine

## 2017-10-13 ENCOUNTER — Ambulatory Visit (INDEPENDENT_AMBULATORY_CARE_PROVIDER_SITE_OTHER): Payer: Medicare (Managed Care) | Admitting: Family Medicine

## 2017-10-13 VITALS — BP 110/72 | HR 62 | Temp 99.0°F | Ht 69.0 in | Wt 187.5 lb

## 2017-10-13 DIAGNOSIS — J4520 Mild intermittent asthma, uncomplicated: Secondary | ICD-10-CM | POA: Diagnosis not present

## 2017-10-13 DIAGNOSIS — F4310 Post-traumatic stress disorder, unspecified: Secondary | ICD-10-CM

## 2017-10-13 DIAGNOSIS — D508 Other iron deficiency anemias: Secondary | ICD-10-CM

## 2017-10-13 DIAGNOSIS — R7301 Impaired fasting glucose: Secondary | ICD-10-CM | POA: Diagnosis not present

## 2017-10-13 DIAGNOSIS — Z23 Encounter for immunization: Secondary | ICD-10-CM

## 2017-10-13 DIAGNOSIS — Z1159 Encounter for screening for other viral diseases: Secondary | ICD-10-CM | POA: Diagnosis not present

## 2017-10-13 DIAGNOSIS — F430 Acute stress reaction: Secondary | ICD-10-CM

## 2017-10-13 DIAGNOSIS — Z114 Encounter for screening for human immunodeficiency virus [HIV]: Secondary | ICD-10-CM

## 2017-10-13 NOTE — Progress Notes (Signed)
Subjective  CC:  Chief Complaint  Patient presents with  . Anxiety  . Osteoarthristis  . Post-Traumatic Stress Disorder    HPI: Corey Collins is a 56 y.o. adult who presents to [facility] today to establish care with me as a new patient. She is a former [facility] patient. Last cpe was July 2018 with normal labs. She has the following concerns or needs:   Neysa Bonito returns due to recent problems with breathing.  She has a history of allergic asthma and recently has had a spent time with her mother who has multiple cats that trigger her asthma.  Her psychiatrist just called her in an albuterol inhaler.  She says when she uses this it helps.  When she is outside of that home she has not had any asthma symptoms.  She has not had a significant asthma exacerbation since adolescence.  She denies chronic cough sputum production shortness of breath or chest pain.  Part of her subjective shortness of breath may be related to anxiety.  Her mother was recently very ill and hospitalized.  This caused her to feel quite anxious.  She is been working with her psychiatrist to help manage the symptoms.  They have not started any new medications.  Her mother's health has improved and she is feeling significantly better.  She has long-standing PTSD.  Status post hip replacement with secondary iron deficiency anemia.  She stopped iron months ago.  I reviewed recent labs.  Hemoglobin was down to 9.9 back in July.  Her energy level is good.  History of impaired fasting glucose and she reports that her sugars were elevated in July when she had her surgery.  Her most recent A1c was back in July and was 5.8 at that time.  She is on metformin.  She has a history of diabetes in the past.  Health maintenance: Due for flu shot and hepatitis C and HIV screening. Pt was referred to gi or colonoscopy - need to clarify if she had it done.   We updated and reviewed the patient's past history in  detail and it is documented below.  Patient Active Problem List   Diagnosis Date Noted  . Allergic asthma 12/15/2016    Priority: High  . Cigarette nicotine dependence without complication 12/15/2016    Priority: High  . Major depression, chronic 12/15/2016    Priority: High  . 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 PTSD (post-traumatic stress disorder) 12/15/2016    Priority: High  . Anxiety 10/11/2017  . Overweight (BMI 25.0-29.9) 05/25/2017  . S/P right THA, AA 05/24/2017  . S/P left THA, AA 02/01/2017    Health Maintenance  Topic Date Due  . Hepatitis C Screening  [birth year]  . HIV Screening  03/26/1977  . COLONOSCOPY  03/26/2012  . TETANUS/TDAP  07/11/2022  . INFLUENZA VACCINE  Completed   Immunization History  Administered Date(s) Administered  . Influenza,inj,Quad PF,6+ Mos 10/13/2017  . Pneumococcal Polysaccharide-23 05/12/2017  . Rabies, IM 07/11/2012  . Rabies, intradermal 07/11/2012  . Tdap 07/11/2012   Current Meds  Medication Sig  . amitriptyline (ELAVIL) 50 MG tablet Take 50 mg by mouth at bedtime as needed for sleep.  . Cholecalciferol (VITAMIN D3 GUMMIES ADULT PO) Take 2 each by mouth daily.  . citalopram (CELEXA) 40 MG tablet Take 40 mg by mouth daily.  . Coenzyme Q10 (COQ-10 PO) Take 1 each by mouth daily.  Marland Kitchen ibuprofen (ADVIL,MOTRIN) 200  MG tablet Take 800 mg by mouth daily.   Marland Kitchen. lithium carbonate 300 MG capsule Take 300 mg by mouth 3 (three) times daily.  . metFORMIN (GLUCOPHAGE) 500 MG tablet Take 500 mg by mouth 2 (two) times daily with a meal.  . methylphenidate (RITALIN) 10 MG tablet Take 20 mg by mouth daily.  . Omega-3 Fatty Acids (FISH OIL) 1000 MG CAPS Take 1 capsule by mouth daily.  . prazosin (MINIPRESS) 1 MG capsule Take 1 mg by mouth 2 (two) times daily as needed.   . Prenatal Vit-Min-FA-Fish Oil (CVS PRENATAL GUMMY PO) Take 2 tablets by mouth daily.  Marland Kitchen. PROAIR RESPICLICK 108 (90 Base) MCG/ACT AEPB INHALE ONE PUFF  BY MOUTH EVERY 4 HOURS  . [DISCONTINUED] albuterol (PROVENTIL HFA;VENTOLIN HFA) 108 (90 BASE) MCG/ACT inhaler Inhale 2 puffs into the lungs every 6 (six) hours as needed for wheezing or shortness of breath.    Allergies: Patient is allergic to sulfa antibiotics.  Past Medical History Patient  has a past medical history of Arthritis, Asthma, Borderline diabetes, DVT (deep venous thrombosis) (HCC) (2009), Headache, History of kidney stones, Pneumonia (age 89 or 1719), Primary osteoarthritis of right hip (12/15/2016), PTSD (post-traumatic stress disorder), Puncture wound, and Seasonal allergies. Past Surgical History Patient  has a past surgical history that includes Appendectomy; Nasal fracture surgery; Hernia repair; Total hip arthroplasty (Left, 02/01/2017); and Total hip arthroplasty (Right, 05/24/2017). Family History: Patient family history includes Coronary artery disease in her father; Diabetes in her other; Hypertension in her other. Social History:  Patient  reports that she has been smoking cigarettes.  She has a 3.50 pack-year smoking history. she has never used smokeless tobacco. She reports that she does not drink alcohol or use drugs.  Review of Systems: Constitutional: negative for fever or malaise Cardiovascular: negative for chest pain Respiratory: negative for persistent cough Gastrointestinal: negative for abdominal pain Genitourinary: negative for dysuria or gross hematuria Musculoskeletal: negative for new gait disturbance or muscular weakness Integumentary: negative for new or persistent rashes  Patient Care Team    Relationship Specialty Notifications Start End  Willow OraAndy, Terrel Nesheiwat L, MD PCP - General Family Medicine  01/26/17     Objective  Vitals: BP 110/72 (BP Location: Left Arm, Patient Position: Sitting, Cuff Size: Large)   Pulse 62   Temp 99 F (37.2 C) (Oral)   Ht 5\' 9"  (1.753 m)   Wt 187 lb 8 oz (85 kg)   SpO2 95%   BMI 27.69 kg/m  General:  Well developed, well  nourished, no acute distress , no respiratory distress Psych:  Alert and oriented,normal mood and affect HEENT:  Normocephalic, atraumatic, supple neck  Cardiovascular:  RRR without murmur Respiratory:  Good breath sounds bilaterally, CTAB with normal respiratory effort Neurologic:   Mental status is normal. Gross motor and sensory exams are normal. Normal gait. No tremor  Assessment  1. Mild intermittent extrinsic asthma without complication   2. Stress reaction   3. PTSD (post-traumatic stress disorder)   4. IFG (impaired fasting glucose)   5. Other iron deficiency anemia   6. Need for prophylactic vaccination and inoculation against influenza   7. Need for hepatitis C screening test   8. Encounter for screening for HIV      Plan   Allergic asthma: Avoid triggers if possible.  Use rescue inhaler as needed.  Follow-up if symptoms persist outside of known triggers.  Patient continues to smoke and is unlikely to quit at this time.  Follow for worsening  asthma/development of COPD.  PTSD and stress reaction: Stable now.  Follow-up with psych.  IfG: Check A1c to ensure sugars are stable.  Continue metformin for now.  Recent iron deficiency anemia related to surgery: Recheck levels and restart iron if remains low.  No signs of bleeding at this time.  Check STD screens and influenza updated We will follow-up after lab results return.  Otherwise follow-up in July for complete physical  Follow up: Return if symptoms worsen or fail to improve.   Commons side effects, risks, benefits, and alternatives for medications and treatment plan prescribed today were discussed, and the patient expressed understanding of the given instructions. Patient is instructed to call or message via MyChart if he/she has any questions or concerns regarding our treatment plan. No barriers to understanding were identified. We discussed Red Flag symptoms and signs in detail. Patient expressed understanding regarding  what to do in case of urgent or emergency type symptoms.   Medication list was reconciled, printed and provided to the patient in AVS. Patient instructions and summary information was reviewed with the patient as documented in the AVS. This note was prepared with assistance of Dragon voice recognition software. Occasional wrong-word or sound-a-like substitutions may have occurred due to the inherent limitations of voice recognition software  Orders Placed This Encounter  Procedures  . Peak flow meter  . Flu Vaccine QUAD 36+ mos IM  . CBC with Differential/Platelet  . Iron, TIBC and Ferritin Panel  . Hemoglobin A1c  . Hepatitis C antibody  . HIV antibody   No orders of the defined types were placed in this encounter.

## 2017-10-13 NOTE — Patient Instructions (Signed)
It was so good seeing you again! Thank you for establishing with my new practice and allowing me to continue caring for you. It means a lot to me.   I will release your lab results to you on your MyChart account with further instructions. Please reply with any questions.    Asthma, Adult Asthma is a condition of the lungs in which the airways tighten and narrow. Asthma can make it hard to breathe. Asthma cannot be cured, but medicine and lifestyle changes can help control it. Asthma may be started (triggered) by:  Animal skin flakes (dander).  Dust.  Cockroaches.  Pollen.  Mold.  Smoke.  Cleaning products.  Hair sprays or aerosol sprays.  Paint fumes or strong smells.  Cold air, weather changes, and winds.  Crying or laughing hard.  Stress.  Certain medicines or drugs.  Foods, such as dried fruit, potato chips, and sparkling grape juice.  Infections or conditions (colds, flu).  Exercise.  Certain medical conditions or diseases.  Exercise or tiring activities.  Follow these instructions at home:  Take medicine as told by your doctor.  Use a peak flow meter as told by your doctor. A peak flow meter is a tool that measures how well the lungs are working.  Record and keep track of the peak flow meter's readings.  Understand and use the asthma action plan. An asthma action plan is a written plan for taking care of your asthma and treating your attacks.  To help prevent asthma attacks: ? Do not smoke. Stay away from secondhand smoke. ? Change your heating and air conditioning filter often. ? Limit your use of fireplaces and wood stoves. ? Get rid of pests (such as roaches and mice) and their droppings. ? Throw away plants if you see mold on them. ? Clean your floors. Dust regularly. Use cleaning products that do not smell. ? Have someone vacuum when you are not home. Use a vacuum cleaner with a HEPA filter if possible. ? Replace carpet with wood, tile, or vinyl  flooring. Carpet can trap animal skin flakes and dust. ? Use allergy-proof pillows, mattress covers, and box spring covers. ? Wash bed sheets and blankets every week in hot water and dry them in a dryer. ? Use blankets that are made of polyester or cotton. ? Clean bathrooms and kitchens with bleach. If possible, have someone repaint the walls in these rooms with mold-resistant paint. Keep out of the rooms that are being cleaned and painted. ? Wash hands often. Contact a doctor if:  You have make a whistling sound when breaking (wheeze), have shortness of breath, or have a cough even if taking medicine to prevent attacks.  The colored mucus you cough up (sputum) is thicker than usual.  The colored mucus you cough up changes from clear or white to yellow, green, gray, or bloody.  You have problems from the medicine you are taking such as: ? A rash. ? Itching. ? Swelling. ? Trouble breathing.  You need reliever medicines more than 2-3 times a week.  Your peak flow measurement is still at 50-79% of your personal best after following the action plan for 1 hour.  You have a fever. Get help right away if:  You seem to be worse and are not responding to medicine during an asthma attack.  You are short of breath even at rest.  You get short of breath when doing very little activity.  You have trouble eating, drinking, or talking.  You  have chest pain.  You have a fast heartbeat.  Your lips or fingernails start to turn blue.  You are light-headed, dizzy, or faint.  Your peak flow is less than 50% of your personal best. This information is not intended to replace advice given to you by your health care provider. Make sure you discuss any questions you have with your health care provider. Document Released: 04/12/2008 Document Revised: 04/01/2016 Document Reviewed: 05/24/2013 Elsevier Interactive Patient Education  2017 [employer]

## 2017-10-14 LAB — HEMOGLOBIN A1C
EAG (MMOL/L): 7.3 (calc)
HEMOGLOBIN A1C: 6.2 %{Hb} — AB (ref <5.7)
Mean Plasma Glucose: 131 (calc)

## 2017-10-14 LAB — CBC WITH DIFFERENTIAL/PLATELET
BASOS PCT: 1 %
Basophils Absolute: 62 cells/uL (ref 0–200)
EOS ABS: 391 {cells}/uL (ref 15–500)
Eosinophils Relative: 6.3 %
HCT: 37.7 % — ABNORMAL LOW (ref 38.5–50.0)
HEMOGLOBIN: 13.1 g/dL — AB (ref 13.2–17.1)
LYMPHS ABS: 1321 {cells}/uL (ref 850–3900)
MCH: 28.8 pg (ref 27.0–33.0)
MCHC: 34.7 g/dL (ref 32.0–36.0)
MCV: 82.9 fL (ref 80.0–100.0)
MPV: 9.8 fL (ref 7.5–12.5)
Monocytes Relative: 8.3 %
NEUTROS ABS: 3912 {cells}/uL (ref 1500–7800)
Neutrophils Relative %: 63.1 %
Platelets: 279 10*3/uL (ref 140–400)
RBC: 4.55 10*6/uL (ref 4.20–5.80)
RDW: 15.5 % — ABNORMAL HIGH (ref 11.0–15.0)
Total Lymphocyte: 21.3 %
WBC: 6.2 10*3/uL (ref 3.8–10.8)
WBCMIX: 515 {cells}/uL (ref 200–950)

## 2017-10-14 LAB — IRON,TIBC AND FERRITIN PANEL
%SAT: 13 % (calc) — ABNORMAL LOW (ref 15–60)
FERRITIN: 11 ng/mL — AB (ref 20–380)
IRON: 45 ug/dL — AB (ref 50–180)
TIBC: 358 mcg/dL (calc) (ref 250–425)

## 2017-10-14 LAB — HIV ANTIBODY (ROUTINE TESTING W REFLEX): HIV 1&2 Ab, 4th Generation: NONREACTIVE

## 2017-10-14 LAB — HEPATITIS C ANTIBODY
HEP C AB: NONREACTIVE
SIGNAL TO CUT-OFF: 0.02 (ref <1.00)

## 2017-10-14 NOTE — Progress Notes (Signed)
Please call patient: I have reviewed his/her lab results. CBC shows anemia has resolved but iron stores remain low; please restart otc iron 1-2x/ day for next 3 months.  A1c is 6.2 (elevated even on metformin), thus she likely has early diabetes. Continue metformin and monitor diet. rec f/u visit in 3 months to recheck and will need urine testing and lipid testing at that time.  HIV and hep c are negative.

## 2017-11-10 ENCOUNTER — Ambulatory Visit: Payer: Medicare (Managed Care) | Admitting: Family Medicine

## 2017-12-01 ENCOUNTER — Ambulatory Visit: Payer: Medicare (Managed Care) | Admitting: Family Medicine

## 2017-12-02 ENCOUNTER — Ambulatory Visit: Payer: Medicare (Managed Care) | Admitting: Family Medicine

## 2017-12-02 ENCOUNTER — Encounter: Payer: Self-pay | Admitting: Family Medicine

## 2017-12-02 VITALS — BP 120/84 | HR 83 | Temp 98.3°F | Ht 69.0 in | Wt 184.2 lb

## 2017-12-02 DIAGNOSIS — K4090 Unilateral inguinal hernia, without obstruction or gangrene, not specified as recurrent: Secondary | ICD-10-CM

## 2017-12-02 MED ORDER — POLYETHYLENE GLYCOL 3350 17 GM/SCOOP PO POWD: 17.0000 g | Freq: Two times a day (BID) | ORAL | 1 refills | Status: DC | PRN

## 2017-12-02 NOTE — Patient Instructions (Signed)
We will call you with information regarding your referral appointment. General surgery for hernia repair.  Start daily miralax.  Inguinal Hernia, Adult An inguinal hernia is when fat or the intestines push through the area where the leg meets the lower abdomen (groin) and create a rounded lump (bulge). This condition develops over time. There are three types of inguinal hernias. These types include:  Hernias that can be pushed back into the belly (are reducible).  Hernias that are not reducible (are incarcerated).  Hernias that are not reducible and lose their blood supply (are strangulated). This type of hernia requires emergency surgery.  What are the causes? This condition is caused by having a weak spot in the muscles or tissue. This weakness lets the hernia poke through. This condition can be triggered by:  Suddenly straining the muscles of the lower abdomen.  Lifting heavy objects.  Straining to have a bowel movement. Difficult bowel movements (constipation) can lead to this.  Coughing.  What increases the risk? This condition is more likely to develop in:  Men.  Pregnant women.  People who: ? Are overweight. ? Work in jobs that require long periods of standing or heavy lifting. ? Have had an inguinal hernia before. ? Smoke or have lung disease. These factors can lead to long-lasting (chronic) coughing.  What are the signs or symptoms? Symptoms can depend on the size of the hernia. Often, a small inguinal hernia has no symptoms. Symptoms of a larger hernia include:  A lump in the groin. This is easier to see when the person is standing. It might not be visible when he or she is lying down.  Pain or burning in the groin. This occurs especially when lifting, straining, or coughing.  A dull ache or a feeling of pressure in the groin.  A lump in the scrotum in men.  Symptoms of a strangulated inguinal hernia can include:  A bulge in the groin that is very painful  and tender to the touch.  A bulge that turns red or purple.  Fever, nausea, and vomiting.  The inability to have a bowel movement or to pass gas.  How is this diagnosed? This condition is diagnosed with a medical history and physical exam. Your health care provider may feel your groin area and ask you to cough. How is this treated? Treatment for this condition varies depending on the size of your hernia and whether you have symptoms. If you do not have symptoms, your health care provider may have you watch your hernia carefully and come in for follow-up visits. If your hernia is larger or if you have symptoms, your treatment will include surgery. Follow these instructions at home: Lifestyle  Drink enough fluid to keep your urine clear or pale yellow.  Eat a diet that includes a lot of fiber. Eat plenty of fruits, vegetables, and whole grains. Talk with your health care provider if you have questions.  Avoid lifting heavy objects.  Avoid standing for long periods of time.  Do not use tobacco products, including cigarettes, chewing tobacco, or e-cigarettes. If you need help quitting, ask your health care provider.  Maintain a healthy weight. General instructions  Do not try to force the hernia back in.  Watch your hernia for any changes in color or size. Let your health care provider know if any changes occur.  Take over-the-counter and prescription medicines only as told by your health care provider.  Keep all follow-up visits as told by your health  care provider. This is important. Contact a health care provider if:  You have a fever.  You have new symptoms.  Your symptoms get worse. Get help right away if:  You have pain in the groin that suddenly gets worse.  A bulge in the groin gets bigger suddenly and does not go down.  You are a man and you have a sudden pain in the scrotum, or the size of your scrotum suddenly changes.  A bulge in the groin area becomes red or  purple and is painful to the touch.  You have nausea or vomiting that does not go away.  You feel your heart beating a lot more quickly than normal.  You cannot have a bowel movement or pass gas. This information is not intended to replace advice given to you by your health care provider. Make sure you discuss any questions you have with your health care provider. Document Released: 03/13/2009 Document Revised: 04/01/2016 Document Reviewed: 09/04/2014 Elsevier Interactive Patient Education  2018 [employer]

## 2017-12-02 NOTE — Progress Notes (Signed)
Subjective  CC:  Chief Complaint  Patient presents with  . Abdominal Pain    Patient reports that the pain is intermitent  . Constipation  . Hip Pain    Right Hip  . Heartburn    HPI: Corey Collins is a 57 y.o. adult who presents to the office today to address the problems listed above in the chief complaint.  Corey Collins has been experiencing right inguinal pain and noticed a bulge in the right groin at times a small is a ping-pong ball, at times as large as a softball.  Has been suffering with constipation which exacerbates symptoms.  Has had 3 recurrent episodes of moderate to severe pain that have been resolved with resolution of the hernia.  He reports that he has been diagnosed with a hernia there about 4 years ago.  No fevers chills or blood in the stool. I reviewed the patients updated PMH, FH, and SocHx.    Patient Active Problem List   Diagnosis Date Noted  . Allergic asthma 12/15/2016    Priority: High  . Cigarette nicotine dependence without complication 12/15/2016    Priority: High  . Major depression, chronic 12/15/2016    Priority: High  . 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 PTSD (post-traumatic stress disorder) 12/15/2016    Priority: High  . Right inguinal hernia 12/02/2017  . Anxiety 10/11/2017  . Overweight (BMI 25.0-29.9) 05/25/2017  . S/P right THA, AA 05/24/2017  . S/P left THA, AA 02/01/2017   Current Meds  Medication Sig  . amitriptyline (ELAVIL) 50 MG tablet Take 50 mg by mouth at bedtime as needed for sleep.  . Cholecalciferol (VITAMIN D3 GUMMIES ADULT PO) Take 2 each by mouth daily.  . citalopram (CELEXA) 40 MG tablet Take 40 mg by mouth daily.  Marland Kitchen. lithium carbonate 300 MG capsule Take 300 mg by mouth 3 (three) times daily.  . metFORMIN (GLUCOPHAGE) 500 MG tablet Take 500 mg by mouth 2 (two) times daily with a meal.  . methylphenidate (RITALIN) 10 MG tablet Take 20 mg by mouth daily.  . Omega-3 Fatty Acids (FISH  OIL) 1000 MG CAPS Take 1 capsule by mouth daily.  Marland Kitchen. PROAIR RESPICLICK 108 (90 Base) MCG/ACT AEPB INHALE ONE PUFF BY MOUTH EVERY 4 HOURS    Allergies: Patient is allergic to sulfa antibiotics. Family History: Patient family history includes Coronary artery disease in her father; Diabetes in her other; Hypertension in her other. Social History:  Patient  reports that she has been smoking cigarettes.  She has a 3.50 pack-year smoking history. she has never used smokeless tobacco. She reports that she uses drugs. Drug: Marijuana. She reports that she does not drink alcohol.  Review of Systems: Constitutional: Negative for fever malaise or anorexia Cardiovascular: negative for chest pain Respiratory: negative for SOB or persistent cough Gastrointestinal: negative for abdominal pain  Objective  Vitals: BP 120/84 (BP Location: Left Arm, Patient Position: Sitting, Cuff Size: Large)   Pulse 83   Temp 98.3 F (36.8 C) (Oral)   Ht 5\' 9"  (1.753 m)   Wt 184 lb 3.2 oz (83.6 kg)   SpO2 96%   BMI 27.20 kg/m  General: no acute distress , A&Ox3 Cardiovascular:  RRR without murmur or gallop.  Respiratory:  Good breath sounds bilaterally, CTAB with normal respiratory effort Gastrointestinal: Soft abdomen normal bowel sounds, right visible bulge in the groin, nontender, large inguinal palpated, not fully reducible. Skin:  Warm, no rashes  Assessment  1. Right inguinal hernia      Plan   Right inguinal hernia: Refer to general surgery for repair.  Discussed red flags of incarceration symptoms.  Patient is a transgender male to male and would like to have his testicles removed as well.  He will discuss this with general surgery and make further recommendations for referrals at that time.  Follow up: Follow-up as needed.   Commons side effects, risks, benefits, and alternatives for medications and treatment plan prescribed today were discussed, and the patient expressed understanding of the  given instructions. Patient is instructed to call or message via MyChart if he/she has any questions or concerns regarding our treatment plan. No barriers to understanding were identified. We discussed Red Flag symptoms and signs in detail. Patient expressed understanding regarding what to do in case of urgent or emergency type symptoms.   Medication list was reconciled, printed and provided to the patient in AVS. Patient instructions and summary information was reviewed with the patient as documented in the AVS. This note was prepared with assistance of Dragon voice recognition software. Occasional wrong-word or sound-a-like substitutions may have occurred due to the inherent limitations of voice recognition software  Orders Placed This Encounter  Procedures  . Ambulatory referral to General Surgery   Meds ordered this encounter  Medications  . polyethylene glycol powder (GLYCOLAX/MIRALAX) powder    Sig: Take 17 g by mouth 2 (two) times daily as needed.    Dispense:  850 g    Refill:  1

## 2017-12-19 ENCOUNTER — Other Ambulatory Visit: Payer: Self-pay

## 2017-12-19 ENCOUNTER — Encounter: Payer: Self-pay | Admitting: Family Medicine

## 2017-12-19 ENCOUNTER — Ambulatory Visit (INDEPENDENT_AMBULATORY_CARE_PROVIDER_SITE_OTHER): Payer: Medicare (Managed Care) | Admitting: Family Medicine

## 2017-12-19 ENCOUNTER — Telehealth: Payer: Self-pay | Admitting: *Deleted

## 2017-12-19 VITALS — BP 92/68 | HR 77 | Temp 99.3°F | Ht 69.0 in | Wt 176.6 lb

## 2017-12-19 DIAGNOSIS — K4090 Unilateral inguinal hernia, without obstruction or gangrene, not specified as recurrent: Secondary | ICD-10-CM | POA: Diagnosis not present

## 2017-12-19 MED ORDER — ONDANSETRON HCL 4 MG PO TABS: 4.0000 mg | ORAL_TABLET | Freq: Three times a day (TID) | ORAL | 0 refills | Status: DC | PRN

## 2017-12-19 NOTE — Telephone Encounter (Signed)
Marylene Landngela,   Can you call and see if you can get him in sooner?    Thank you,   Florice Hindle

## 2017-12-19 NOTE — Telephone Encounter (Signed)
Reason for CRM: pt called to inform his pcp about the physician he wants to see, [facility] [phone]

## 2017-12-19 NOTE — Progress Notes (Signed)
Subjective  CC:  Chief Complaint  Patient presents with  . Emesis    Since yesterday morning, started to be able to keep stuff down this morning  . Genatlia Swelling    HPI: Corey Collins is a 57 y.o. adult who presents to the office today to address the problems listed above in the chief complaint.  Patient complains of right lower abdominal pain that was moderate to severe yesterday and was associated with several episodes of vomiting and a few loose small amount of stool.  He has a known large right inguinal hernia.  He denies fevers chills or significant body aches.  No vomiting today.  He has been trying to keep himself hydrated using energy drinks.  He has an appointment with the surgeon in February 20 to be evaluated for hernia repair.  He is not in pain now. I reviewed the patients updated PMH, FH, and SocHx.    Patient Active Problem List   Diagnosis Date Noted  . Allergic asthma 12/15/2016    Priority: High  . Cigarette nicotine dependence without complication 12/15/2016    Priority: High  . Major depression, chronic 12/15/2016    Priority: High  . 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 PTSD (post-traumatic stress disorder) 12/15/2016    Priority: High  . Right inguinal hernia 12/02/2017  . Anxiety 10/11/2017  . Overweight (BMI 25.0-29.9) 05/25/2017  . S/P right THA, AA 05/24/2017  . S/P left THA, AA 02/01/2017   Current Meds  Medication Sig  . amitriptyline (ELAVIL) 50 MG tablet Take 50 mg by mouth at bedtime as needed for sleep.  . Cholecalciferol (VITAMIN D3 GUMMIES ADULT PO) Take 2 each by mouth daily.  . citalopram (CELEXA) 40 MG tablet Take 40 mg by mouth daily.  . Coenzyme Q10 (COQ-10 PO) Take 1 each by mouth daily.  Marland Kitchen. ibuprofen (ADVIL,MOTRIN) 200 MG tablet Take 800 mg by mouth daily.   Marland Kitchen. lithium carbonate 300 MG capsule Take 300 mg by mouth 3 (three) times daily.  . metFORMIN (GLUCOPHAGE) 500 MG tablet Take 500 mg by mouth  2 (two) times daily with a meal.  . methylphenidate (RITALIN) 10 MG tablet Take 20 mg by mouth daily.  . Omega-3 Fatty Acids (FISH OIL) 1000 MG CAPS Take 1 capsule by mouth daily.  . polyethylene glycol powder (GLYCOLAX/MIRALAX) powder Take 17 g by mouth 2 (two) times daily as needed.  . prazosin (MINIPRESS) 1 MG capsule Take 1 mg by mouth 2 (two) times daily as needed.   . Prenatal Vit-Min-FA-Fish Oil ([employer] PRENATAL GUMMY PO) Take 2 tablets by mouth daily.  Marland Kitchen. PROAIR RESPICLICK 108 (90 Base) MCG/ACT AEPB INHALE ONE PUFF BY MOUTH EVERY 4 HOURS    Allergies: Patient is allergic to sulfa antibiotics. Family History: Patient family history includes Coronary artery disease in her father; Diabetes in her other; Hypertension in her other. Social History:  Patient  reports that she has been smoking cigarettes.  She has a 3.50 pack-year smoking history. she has never used smokeless tobacco. She reports that she uses drugs. Drug: Marijuana. She reports that she does not drink alcohol.  Review of Systems: Constitutional: Negative for fever malaise or anorexia Cardiovascular: negative for chest pain, lightheadedness Respiratory: negative for SOB or persistent cough Gastrointestinal: negative for abdominal pain  Objective  Vitals: BP 92/68   Pulse 77   Temp 99.3 F (37.4 C)   Ht 5\' 9"  (1.753 m)   Wt 176 lb  9.6 oz (80.1 kg)   BMI 26.08 kg/m  General: no acute distress , A&Ox3 HEENT: PEERL, conjunctiva normal, Oropharynx moist,neck is supple Cardiovascular:  RRR without murmur or gallop.  Respiratory:  Good breath sounds bilaterally, CTAB with normal respiratory effort Gastrointestinal: Soft, normal bowel sounds, minimally tender diffusely without rebound guarding or masses in the upper quadrants.  Right lower quadrant with large bulging hernia that is easily reduced.  she is tender in this area. Skin:  Warm, no rashes  Assessment  1. Right inguinal hernia      Plan   Hernia: Worrisome  for worsening hernia with symptomatic pain.  Again reviewed symptoms of incarceration and recommended ER evaluation if severe pain returns.  Will try to get patient into see the surgeon sooner if possible.  He is traveling to the [place] to visit his ailing mother for the next several days.  Zofran to be given to treat associated nausea if it returns.  Want to avoid Valsalva and increase abdominal pressure which worsens his bulging hernia and pain.  Discussed his mildly low blood pressure today.  He is not symptomatic.  Increase fluids.  Did discuss possibility of a mild gastroenteritis and how to support that.  Follow up: Refer to general surgery for evaluation of hernia.   Commons side effects, risks, benefits, and alternatives for medications and treatment plan prescribed today were discussed, and the patient expressed understanding of the given instructions. Patient is instructed to call or message via MyChart if he/she has any questions or concerns regarding our treatment plan. No barriers to understanding were identified. We discussed Red Flag symptoms and signs in detail. Patient expressed understanding regarding what to do in case of urgent or emergency type symptoms.   Medication list was reconciled, printed and provided to the patient in AVS. Patient instructions and summary information was reviewed with the patient as documented in the AVS. This note was prepared with assistance of Dragon voice recognition software. Occasional wrong-word or sound-a-like substitutions may have occurred due to the inherent limitations of voice recognition software  No orders of the defined types were placed in this encounter.  No orders of the defined types were placed in this encounter.

## 2017-12-20 NOTE — Telephone Encounter (Signed)
Spoke to pt and him aware.

## 2017-12-20 NOTE — Telephone Encounter (Signed)
Called [facility] 2/20 is the soonest that they are able to get the pt in.

## 2018-02-04 ENCOUNTER — Encounter (HOSPITAL_COMMUNITY): Payer: Self-pay | Admitting: Emergency Medicine

## 2018-02-04 ENCOUNTER — Encounter (HOSPITAL_COMMUNITY): Admission: EM | Disposition: A | Discharge status: Home / Self Care | Payer: Self-pay | Source: Home / Self Care

## 2018-02-04 ENCOUNTER — Inpatient Hospital Stay (HOSPITAL_COMMUNITY)
Admission: EM | Admit: 2018-02-04 | Discharge: 2018-02-08 | DRG: 331 | Disposition: A | Discharge status: Home / Self Care | Payer: Medicare (Managed Care) | Attending: Surgery | Admitting: Surgery

## 2018-02-04 DIAGNOSIS — K4030 Unilateral inguinal hernia, with obstruction, without gangrene, not specified as recurrent: Secondary | ICD-10-CM | POA: Diagnosis not present

## 2018-02-04 DIAGNOSIS — K420 Umbilical hernia with obstruction, without gangrene: Secondary | ICD-10-CM | POA: Diagnosis present

## 2018-02-04 DIAGNOSIS — Z7984 Long term (current) use of oral hypoglycemic drugs: Secondary | ICD-10-CM

## 2018-02-04 DIAGNOSIS — M1990 Unspecified osteoarthritis, unspecified site: Secondary | ICD-10-CM | POA: Diagnosis present

## 2018-02-04 DIAGNOSIS — Z9049 Acquired absence of other specified parts of digestive tract: Secondary | ICD-10-CM

## 2018-02-04 DIAGNOSIS — E119 Type 2 diabetes mellitus without complications: Secondary | ICD-10-CM | POA: Diagnosis present

## 2018-02-04 DIAGNOSIS — J45909 Unspecified asthma, uncomplicated: Secondary | ICD-10-CM | POA: Diagnosis present

## 2018-02-04 DIAGNOSIS — Z79899 Other long term (current) drug therapy: Secondary | ICD-10-CM

## 2018-02-04 DIAGNOSIS — K660 Peritoneal adhesions (postprocedural) (postinfection): Secondary | ICD-10-CM | POA: Diagnosis present

## 2018-02-04 DIAGNOSIS — F649 Gender identity disorder, unspecified: Secondary | ICD-10-CM | POA: Diagnosis present

## 2018-02-04 DIAGNOSIS — K4031 Unilateral inguinal hernia, with obstruction, without gangrene, recurrent: Secondary | ICD-10-CM | POA: Diagnosis not present

## 2018-02-04 DIAGNOSIS — Z96643 Presence of artificial hip joint, bilateral: Secondary | ICD-10-CM | POA: Diagnosis present

## 2018-02-04 DIAGNOSIS — F17210 Nicotine dependence, cigarettes, uncomplicated: Secondary | ICD-10-CM | POA: Diagnosis present

## 2018-02-04 DIAGNOSIS — F4310 Post-traumatic stress disorder, unspecified: Secondary | ICD-10-CM | POA: Diagnosis present

## 2018-02-04 DIAGNOSIS — Z86718 Personal history of other venous thrombosis and embolism: Secondary | ICD-10-CM

## 2018-02-04 DIAGNOSIS — Z4659 Encounter for fitting and adjustment of other gastrointestinal appliance and device: Secondary | ICD-10-CM

## 2018-02-04 DIAGNOSIS — Z882 Allergy status to sulfonamides status: Secondary | ICD-10-CM

## 2018-02-04 HISTORY — PX: INGUINAL HERNIA REPAIR: SHX194

## 2018-02-04 HISTORY — PX: BOWEL RESECTION: SHX1257

## 2018-02-04 HISTORY — PX: LAPAROTOMY: SHX154

## 2018-02-04 SURGERY — REPAIR, HERNIA, INGUINAL, ADULT
Anesthesia: General | Laterality: Right

## 2018-02-04 MED ORDER — LITHIUM CARBONATE 300 MG PO CAPS
300.0000 mg | ORAL_CAPSULE | Freq: Three times a day (TID) | ORAL | Status: DC
  Administered 2018-02-05 – 2018-02-08 (×10): 300 mg via ORAL
  Filled 2018-02-04 (×11): qty 1

## 2018-02-04 MED ORDER — SODIUM CHLORIDE 0.9 % IV BOLUS
1000.0000 mL | Freq: Once | INTRAVENOUS | Status: AC
  Administered 2018-02-04: 1000 mL via INTRAVENOUS

## 2018-02-04 MED ORDER — DIPHENHYDRAMINE HCL 25 MG PO CAPS: 25.0000 mg | ORAL_CAPSULE | Freq: Four times a day (QID) | ORAL | Status: DC | PRN

## 2018-02-04 MED ORDER — ENOXAPARIN SODIUM 40 MG/0.4ML ~~LOC~~ SOLN
40.0000 mg | SUBCUTANEOUS | Status: DC
  Administered 2018-02-05 – 2018-02-07 (×3): 40 mg via SUBCUTANEOUS
  Filled 2018-02-04 (×3): qty 0.4

## 2018-02-04 MED ORDER — AMITRIPTYLINE HCL 50 MG PO TABS
50.0000 mg | ORAL_TABLET | Freq: Every evening | ORAL | Status: DC | PRN
  Filled 2018-02-04: qty 1

## 2018-02-04 MED ORDER — HYDROMORPHONE HCL 1 MG/ML IJ SOLN
0.5000 mg | INTRAMUSCULAR | Status: DC | PRN
  Administered 2018-02-05: 0.5 mg via INTRAVENOUS
  Filled 2018-02-04: qty 0.5

## 2018-02-04 MED ORDER — MIDAZOLAM HCL 2 MG/2ML IJ SOLN
INTRAMUSCULAR | Status: AC
  Filled 2018-02-04: qty 2

## 2018-02-04 MED ORDER — IBUPROFEN 200 MG PO TABS: 600.0000 mg | ORAL_TABLET | Freq: Four times a day (QID) | ORAL | Status: DC | PRN

## 2018-02-04 MED ORDER — ROCURONIUM BROMIDE 10 MG/ML (PF) SYRINGE
PREFILLED_SYRINGE | INTRAVENOUS | Status: AC
  Filled 2018-02-04: qty 5

## 2018-02-04 MED ORDER — CITALOPRAM HYDROBROMIDE 20 MG PO TABS
40.0000 mg | ORAL_TABLET | Freq: Every day | ORAL | Status: DC
  Administered 2018-02-05 – 2018-02-08 (×4): 40 mg via ORAL
  Filled 2018-02-04 (×5): qty 2

## 2018-02-04 MED ORDER — INSULIN ASPART 100 UNIT/ML ~~LOC~~ SOLN
0.0000 [IU] | SUBCUTANEOUS | Status: DC
  Administered 2018-02-05: 3 [IU] via SUBCUTANEOUS
  Administered 2018-02-05: 2 [IU] via SUBCUTANEOUS
  Administered 2018-02-05: 3 [IU] via SUBCUTANEOUS
  Administered 2018-02-06 (×2): 2 [IU] via SUBCUTANEOUS
  Administered 2018-02-06 – 2018-02-07 (×4): 3 [IU] via SUBCUTANEOUS

## 2018-02-04 MED ORDER — CEFAZOLIN SODIUM-DEXTROSE 2-4 GM/100ML-% IV SOLN
2.0000 g | Freq: Once | INTRAVENOUS | Status: AC
  Administered 2018-02-05: 2 g via INTRAVENOUS
  Filled 2018-02-04 (×2): qty 100

## 2018-02-04 MED ORDER — METHOCARBAMOL 500 MG PO TABS
500.0000 mg | ORAL_TABLET | Freq: Four times a day (QID) | ORAL | Status: DC | PRN
  Administered 2018-02-05 – 2018-02-07 (×4): 500 mg via ORAL
  Filled 2018-02-04 (×4): qty 1

## 2018-02-04 MED ORDER — SIMETHICONE 80 MG PO CHEW
40.0000 mg | CHEWABLE_TABLET | Freq: Four times a day (QID) | ORAL | Status: DC | PRN
  Administered 2018-02-06 (×2): 40 mg via ORAL
  Filled 2018-02-04 (×3): qty 1

## 2018-02-04 MED ORDER — ONDANSETRON HCL 4 MG/2ML IJ SOLN
4.0000 mg | Freq: Once | INTRAMUSCULAR | Status: AC
  Administered 2018-02-04: 4 mg via INTRAVENOUS
  Filled 2018-02-04: qty 2

## 2018-02-04 MED ORDER — HYDROMORPHONE HCL 1 MG/ML IJ SOLN
1.0000 mg | Freq: Once | INTRAMUSCULAR | Status: AC
  Administered 2018-02-04: 1 mg via INTRAVENOUS
  Filled 2018-02-04: qty 1

## 2018-02-04 MED ORDER — DOCUSATE SODIUM 100 MG PO CAPS
100.0000 mg | ORAL_CAPSULE | Freq: Two times a day (BID) | ORAL | Status: DC
  Administered 2018-02-05 – 2018-02-08 (×7): 100 mg via ORAL
  Filled 2018-02-04 (×7): qty 1

## 2018-02-04 MED ORDER — SODIUM CHLORIDE 0.9 % IV SOLN
INTRAVENOUS | Status: DC
  Administered 2018-02-05 – 2018-02-07 (×6): via INTRAVENOUS

## 2018-02-04 MED ORDER — BUPIVACAINE-EPINEPHRINE 0.5% -1:200000 IJ SOLN
INTRAMUSCULAR | Status: AC
  Filled 2018-02-04: qty 1

## 2018-02-04 MED ORDER — ONDANSETRON HCL 4 MG/2ML IJ SOLN
INTRAMUSCULAR | Status: AC
  Filled 2018-02-04: qty 2

## 2018-02-04 MED ORDER — IPRATROPIUM-ALBUTEROL 0.5-2.5 (3) MG/3ML IN SOLN
3.0000 mL | Freq: Four times a day (QID) | RESPIRATORY_TRACT | Status: DC | PRN
Start: 2018-02-04 — End: 2018-02-08

## 2018-02-04 MED ORDER — METOPROLOL TARTRATE 5 MG/5ML IV SOLN: 5.0000 mg | Freq: Four times a day (QID) | INTRAVENOUS | Status: DC | PRN

## 2018-02-04 MED ORDER — METHYLPHENIDATE HCL 10 MG PO TABS
20.0000 mg | ORAL_TABLET | Freq: Every day | ORAL | Status: DC
  Administered 2018-02-05 – 2018-02-08 (×2): 20 mg via ORAL
  Filled 2018-02-04 (×2): qty 2

## 2018-02-04 MED ORDER — ONDANSETRON 4 MG PO TBDP: 4.0000 mg | ORAL_TABLET | Freq: Four times a day (QID) | ORAL | Status: DC | PRN

## 2018-02-04 MED ORDER — ONDANSETRON HCL 4 MG/2ML IJ SOLN: 4.0000 mg | Freq: Four times a day (QID) | INTRAMUSCULAR | Status: DC | PRN

## 2018-02-04 MED ORDER — HYDRALAZINE HCL 20 MG/ML IJ SOLN: 10.0000 mg | INTRAMUSCULAR | Status: DC | PRN

## 2018-02-04 MED ORDER — DEXAMETHASONE SODIUM PHOSPHATE 10 MG/ML IJ SOLN
INTRAMUSCULAR | Status: AC
  Filled 2018-02-04: qty 1

## 2018-02-04 MED ORDER — FENTANYL CITRATE (PF) 250 MCG/5ML IJ SOLN
INTRAMUSCULAR | Status: AC
  Filled 2018-02-04: qty 5

## 2018-02-04 MED ORDER — LIDOCAINE 2% (20 MG/ML) 5 ML SYRINGE
INTRAMUSCULAR | Status: AC
  Filled 2018-02-04: qty 5

## 2018-02-04 MED ORDER — PANTOPRAZOLE SODIUM 40 MG IV SOLR
40.0000 mg | Freq: Every day | INTRAVENOUS | Status: DC
  Administered 2018-02-05 – 2018-02-06 (×2): 40 mg via INTRAVENOUS
  Filled 2018-02-04 (×3): qty 40

## 2018-02-04 MED ORDER — BISACODYL 10 MG RE SUPP: 10.0000 mg | Freq: Every day | RECTAL | Status: DC | PRN

## 2018-02-04 MED ORDER — ACETAMINOPHEN 500 MG PO TABS
1000.0000 mg | ORAL_TABLET | Freq: Four times a day (QID) | ORAL | Status: DC
  Administered 2018-02-05 – 2018-02-08 (×13): 1000 mg via ORAL
  Filled 2018-02-04 (×13): qty 2

## 2018-02-04 MED ORDER — DIPHENHYDRAMINE HCL 50 MG/ML IJ SOLN: 25.0000 mg | Freq: Four times a day (QID) | INTRAMUSCULAR | Status: DC | PRN

## 2018-02-04 MED ORDER — OXYCODONE HCL 5 MG PO TABS
5.0000 mg | ORAL_TABLET | ORAL | Status: DC | PRN
  Administered 2018-02-05 – 2018-02-06 (×4): 5 mg via ORAL
  Filled 2018-02-04 (×4): qty 1

## 2018-02-04 SURGICAL SUPPLY — 42 items
BENZOIN TINCTURE PRP APPL 2/3 (GAUZE/BANDAGES/DRESSINGS) ×3 IMPLANT
BLADE SURG 15 STRL LF DISP TIS (BLADE) ×2 IMPLANT
BLADE SURG 15 STRL SS (BLADE) ×1
CHLORAPREP W/TINT 26ML (MISCELLANEOUS) ×3 IMPLANT
COVER SURGICAL LIGHT HANDLE (MISCELLANEOUS) ×3 IMPLANT
DECANTER SPIKE VIAL GLASS SM (MISCELLANEOUS) ×3 IMPLANT
DRAIN PENROSE 18X1/2 LTX STRL (DRAIN) ×3 IMPLANT
DRAPE LAPAROTOMY TRNSV 102X78 (DRAPE) ×3 IMPLANT
ELECT PENCIL ROCKER SW 15FT (MISCELLANEOUS) ×3 IMPLANT
ELECT REM PT RETURN 15FT ADLT (MISCELLANEOUS) ×3 IMPLANT
GAUZE SPONGE 4X4 12PLY STRL (GAUZE/BANDAGES/DRESSINGS) ×6 IMPLANT
GLOVE BIO SURGEON STRL SZ 6 (GLOVE) ×3 IMPLANT
GLOVE INDICATOR 6.5 STRL GRN (GLOVE) ×3 IMPLANT
GOWN STRL REUS W/TWL LRG LVL3 (GOWN DISPOSABLE) ×3 IMPLANT
GOWN STRL REUS W/TWL XL LVL3 (GOWN DISPOSABLE) ×3 IMPLANT
KIT BASIN OR (CUSTOM PROCEDURE TRAY) ×3 IMPLANT
NEEDLE HYPO 22GX1.5 SAFETY (NEEDLE) ×6 IMPLANT
PACK BASIC VI WITH GOWN DISP (CUSTOM PROCEDURE TRAY) ×3 IMPLANT
RELOAD PROXIMATE 75MM BLUE (ENDOMECHANICALS) ×9 IMPLANT
SHEARS FOC LG CVD HARMONIC 17C (MISCELLANEOUS) IMPLANT
SPONGE LAP 4X18 X RAY DECT (DISPOSABLE) ×3 IMPLANT
STAPLER GUN LINEAR PROX 60 (STAPLE) ×3 IMPLANT
STAPLER PROXIMATE 75MM BLUE (STAPLE) ×3 IMPLANT
STAPLER VISISTAT 35W (STAPLE) ×3 IMPLANT
STRIP CLOSURE SKIN 1/2X4 (GAUZE/BANDAGES/DRESSINGS) ×3 IMPLANT
SUT ETHIBOND 0 MO6 C/R (SUTURE) ×3 IMPLANT
SUT MNCRL AB 4-0 PS2 18 (SUTURE) ×3 IMPLANT
SUT PDS AB 1 CTX 36 (SUTURE) ×6 IMPLANT
SUT PROLENE 2 0 CT2 30 (SUTURE) ×6 IMPLANT
SUT SILK 3 0 SH CR/8 (SUTURE) ×9 IMPLANT
SUT VIC AB 0 CT1 36 (SUTURE) ×9 IMPLANT
SUT VIC AB 3-0 SH 27 (SUTURE) ×2
SUT VIC AB 3-0 SH 27XBRD (SUTURE) ×4 IMPLANT
SYR CONTROL 10ML LL (SYRINGE) ×6 IMPLANT
SYRINGE 10CC LL (SYRINGE) ×3 IMPLANT
SYRINGE 60CC LL (MISCELLANEOUS) ×3 IMPLANT
TAPE CLOTH SURG 4X10 WHT LF (GAUZE/BANDAGES/DRESSINGS) ×6 IMPLANT
TOWEL OR 17X26 10 PK STRL BLUE (TOWEL DISPOSABLE) ×3 IMPLANT
TOWEL OR NON WOVEN STRL DISP B (DISPOSABLE) ×3 IMPLANT
TRAY FOLEY CATH 14FRSI W/METER (CATHETERS) ×3 IMPLANT
TRAY IRRIG W/60CC SYR STRL (SET/KITS/TRAYS/PACK) ×3 IMPLANT
YANKAUER SUCT BULB TIP 10FT TU (MISCELLANEOUS) IMPLANT

## 2018-02-04 NOTE — ED Notes (Signed)
Spoke with or  Chg bath done  Pt in hospital gown  Pt jewery removed  Last food 6 pm today ( chicken and coke)  Last bm this afternoon 1pm ( 01/25/18)  Urination done at 11:40pm tonight  Type 2 diabetes on metformin  Prescribed beta blockers  No blood thinners

## 2018-02-04 NOTE — ED Provider Notes (Signed)
[facility]-EMERGENCY DEPT Provider Note   CSN: [id number] Arrival date & time: 02/04/18  2133     History   Chief Complaint Chief Complaint  Patient presents with  . Abdominal Pain    HPI Corey Collins is a 57 y.o. adult biological male who is transgendered male.  Corey Collins presents to the emergency department with chief complaint of painful hernia.  She states that she has had intermittent painful episodes with hernia, for 3-4 hours prior to arrival it became firm hard and he was unable to reduce the hernia.  Extremely painful to palpation.  HPI  Past Medical History:  Diagnosis Date  . Arthritis    oa  . Asthma    eith cats  . Borderline diabetes   . DVT (deep venous thrombosis) (HCC) 2009   L calf  . Headache    sinus  . History of kidney stones   . Pneumonia age 57 or 3019  . Primary osteoarthritis of right hip 12/15/2016  . PTSD (post-traumatic stress disorder)   . Puncture wound    2010 left lower leg   . Seasonal allergies     Patient Active Problem List   Diagnosis Date Noted  . Right inguinal hernia 12/02/2017  . Anxiety 10/11/2017  . Overweight (BMI 25.0-29.9) 05/25/2017  . S/P right THA, AA 05/24/2017  . 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 Allergic asthma 12/15/2016  . Cigarette nicotine dependence without complication 12/15/2016  . Major depression, chronic 12/15/2016  . Male-to-male transgender person 12/15/2016  . PTSD (post-traumatic stress disorder) 12/15/2016    Past Surgical History:  Procedure Laterality Date  . APPENDECTOMY    . HERNIA REPAIR    . NASAL FRACTURE SURGERY    . TOTAL HIP ARTHROPLASTY Left 02/01/2017   Procedure: LEFT TOTAL HIP ARTHROPLASTY ANTERIOR APPROACH;  Surgeon: Durene RomansMatthew Olin, MD;  Location: [facility] ORS;  Service: Orthopedics;  Laterality: Left;  . TOTAL HIP ARTHROPLASTY Right 05/24/2017   Procedure: RIGHT TOTAL HIP ARTHROPLASTY ANTERIOR APPROACH;  Surgeon: Durene Romanslin, Matthew, MD;  Location: [facility] ORS;   Service: Orthopedics;  Laterality: Right;        Home Medications    Prior to Admission medications   Medication Sig Start Date End Date Taking? Authorizing Provider  amitriptyline (ELAVIL) 50 MG tablet Take 50 mg by mouth at bedtime as needed for sleep.    [provider]  Cholecalciferol (VITAMIN D3 GUMMIES ADULT PO) Take 2 each by mouth daily.    [provider]  citalopram (CELEXA) 40 MG tablet Take 40 mg by mouth daily.    [provider]  Coenzyme Q10 (COQ-10 PO) Take 1 each by mouth daily.    [provider]  ibuprofen (ADVIL,MOTRIN) 200 MG tablet Take 800 mg by mouth daily.     [provider]  lithium carbonate 300 MG capsule Take 300 mg by mouth 3 (three) times daily.    [provider]  metFORMIN (GLUCOPHAGE) 500 MG tablet Take 500 mg by mouth 2 (two) times daily with a meal.    [provider]  methylphenidate (RITALIN) 10 MG tablet Take 20 mg by mouth daily. 01/20/17   [provider]  Omega-3 Fatty Acids (FISH OIL) 1000 MG CAPS Take 1 capsule by mouth daily.    [provider]  ondansetron (ZOFRAN) 4 MG tablet Take 1 tablet (4 mg total) by mouth every 8 (eight) hours as needed for nausea or vomiting. 12/19/17   Willow OraAndy, Camille L, MD  polyethylene glycol powder (GLYCOLAX/MIRALAX) powder Take 17 g by mouth 2 (two) times daily as needed. 12/02/17   [name], MD  prazosin (MINIPRESS) 1 MG capsule Take 1 mg by mouth 2 (two) times daily as needed.  07/07/17   [provider]  Prenatal Vit-Min-FA-Fish Oil ([employer]S PRENATAL GUMMY PO) Take 2 tablets by mouth daily.    [provider]  PROAIR RESPICLICK 108 [phone] Base) MCG/ACT AEPB INHALE ONE PUFF BY MOUTH EVERY 4 HOURS 08/04/17   [provider]    Family History Family History  Problem Relation Age of Onset  . Coronary artery disease Father   . Hypertension Other   . Diabetes Other     Social History Social History    Tobacco Use  . Smoking status: Current Some Day Smoker    Packs/day: 0.50    Years: 7.00    Pack years: 3.50    Types: Cigarettes    Last attempt to quit: 01/06/2017    Years since quitting: 1.0  . Smokeless tobacco: Never Used  Substance Use Topics  . Alcohol use: No  . Drug use: Yes    Types: Marijuana    Comment: occ marijuana use, last used 2 weeks ago     Allergies   Sulfa antibiotics   Review of Systems Review of Systems  Ten systems reviewed and are negative for acute change, except as noted in the HPI.   Physical Exam Updated Vital Signs There were no vitals taken for this visit.  Physical Exam  Constitutional: She is oriented to person, place, and time. She appears well-developed and well-nourished. No distress.  HENT:  Head: Normocephalic and atraumatic.  Eyes: Conjunctivae are normal. No scleral icterus.  Neck: Normal range of motion.  Cardiovascular: Normal rate, regular rhythm and normal heart sounds. Exam reveals no gallop and no friction rub.  No murmur heard. Pulmonary/Chest: Effort normal and breath sounds normal. No respiratory distress.  Abdominal: Soft. Bowel sounds are normal. She exhibits no distension and no mass. There is no tenderness. There is no guarding.  Genitourinary:  Genitourinary Comments: Firm, exquisitely tender and large right inguinal hernia.  Neurological: She is alert and oriented to person, place, and time.  Skin: Skin is warm and dry. She is not diaphoretic.  Psychiatric: Her behavior is normal.  Nursing note and vitals reviewed.    ED Treatments / Results  Labs (all labs ordered are listed, but only abnormal results are displayed) Labs Reviewed - No data to display  EKG None  Radiology No results found.  Procedures Procedures (including critical care time)  Medications Ordered in ED Medications - No data to display   Initial Impression / Assessment and Plan / ED Course  I have reviewed the triage vital  signs and the nursing notes.  Pertinent labs & imaging results that were available during my care of the patient were reviewed by me and considered in my medical decision making (see chart for details).     Patient here with palpable right inguinal hernia that appears incarcerated.  I spoke with Dr. Doylene Canard of surgery who is going to take the patient to the OR.  I have applied ice and given pain medications.  Attempted reduction by Dr. Phineas Real without success.  Final Clinical Impressions(s) / ED Diagnoses   Final diagnoses:  Incarcerated inguinal hernia    ED Discharge Orders    None       Arthor Captain, PA-C 02/04/18 2330    Mabe, Johnny Bridge  L, MD 02/04/18 [id number]

## 2018-02-04 NOTE — Anesthesia Preprocedure Evaluation (Addendum)
Anesthesia Evaluation  Patient identified by MRN, date of birth, ID band Patient awake    Reviewed: Allergy & Precautions, NPO status , Patient's Chart, lab work & pertinent test results  Airway Mallampati: I       Dental  (+) Teeth Intact, Dental Advisory Given   Pulmonary asthma , Current Smoker,    breath sounds clear to auscultation       Cardiovascular negative cardio ROS   Rhythm:Regular Rate:Normal     Neuro/Psych  Headaches, PSYCHIATRIC DISORDERS    GI/Hepatic negative GI ROS, Neg liver ROS,   Endo/Other  diabetes, Type 2, Oral Hypoglycemic Agents  Renal/GU negative Renal ROS     Musculoskeletal  (+) Arthritis , Osteoarthritis,    Abdominal   Peds  Hematology negative hematology ROS (+)   Anesthesia Other Findings Day of surgery medications reviewed with the patient.  Reproductive/Obstetrics                             Anesthesia Physical  Anesthesia Plan  ASA: II and emergent  Anesthesia Plan: General   Post-op Pain Management:    Induction: Intravenous, Rapid sequence and Cricoid pressure planned  PONV Risk Score and Plan: 2 and Ondansetron and Midazolam  Airway Management Planned: Oral ETT  Additional Equipment:   Intra-op Plan:   Post-operative Plan: Extubation in OR  Informed Consent: I have reviewed the patients History and Physical, chart, labs and discussed the procedure including the risks, benefits and alternatives for the proposed anesthesia with the patient or authorized representative who has indicated his/her understanding and acceptance.     Plan Discussed with:   Anesthesia Plan Comments:        Anesthesia Quick Evaluation

## 2018-02-04 NOTE — ED Triage Notes (Cosign Needed)
Pt comes to ed ,via ems , c/o of abdominal pain. C/o of pain 9 out 10. Hx of hernia dignosed in December. Vs on arrival  cbg 166, rr20, 182 palp,  Comes from home, hx of diabetes,

## 2018-02-04 NOTE — H&P (Signed)
Surgical H&P  CC: abdominal pain  HPI: 57 year old transgender woman who presents with incarcerated right inguinal hernia.  Has been incarcerated for the last 4-5 hours.  This is happened several times over the last few months.  Previously it was eventually reducible at this time it has not reduced.  With pain at the site as well as diffuse abdominal pain.  Patient has vomited.  Last bowel movement was this afternoon around 3:00.  Has not been passing flatus since the hernia popped out.  Prior abdominal surgery includes hernia repair/hydrocelectomy as an infant on the right side.  The patient has been referred to see [place] in the office but had to cancel 2 appointments with Dr. Derrell Lolling in February due to financial concerns as her insurance would not pay for the consultation.    Smokes about a 1/2 ppd.  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    Allergies  Allergen Reactions  . Sulfa Antibiotics Other (See Comments)    Family history, took in [place] in [address] did ok with    Past Medical History:  Diagnosis Date  . Arthritis    oa  . Asthma    eith cats  . Borderline diabetes   . DVT (deep venous thrombosis) (HCC) 2009   L calf  . Headache    sinus  . History of kidney stones   . Pneumonia age 39 or 69  . Primary osteoarthritis of right hip 12/15/2016  . PTSD (post-traumatic stress disorder)   . Puncture wound    2010 left lower leg   . Seasonal allergies     Past Surgical History:  Procedure Laterality Date  . APPENDECTOMY    . HERNIA REPAIR    . NASAL FRACTURE SURGERY    . TOTAL HIP ARTHROPLASTY Left 02/01/2017   Procedure: LEFT TOTAL HIP ARTHROPLASTY ANTERIOR APPROACH;  Surgeon: Durene Romans, MD;  Location: [facility];  Service: Orthopedics;  Laterality: Left;  . TOTAL HIP ARTHROPLASTY Right 05/24/2017   Procedure: RIGHT TOTAL HIP ARTHROPLASTY ANTERIOR APPROACH;  Surgeon: Durene Romans, MD;  Location: [facility];  Service:  Orthopedics;  Laterality: Right;    Family History  Problem Relation Age of Onset  . Coronary artery disease Father   . Hypertension Other   . Diabetes Other     Social History   Socioeconomic History  . Marital status: Divorced    Spouse name: Not on file  . Number of children: Not on file  . Years of education: Not on file  . Highest education level: Not on file  Occupational History  . Occupation: disabled due to PTSD, former professor history  Social Needs  . Financial resource strain: Not on file  . Food insecurity:    Worry: Not on file    Inability: Not on file  . Transportation needs:    Medical: Not on file    Non-medical: Not on file  Tobacco Use  . Smoking status: Current Some Day Smoker    Packs/day: 0.50    Years: 7.00    Pack years: 3.50    Types: Cigarettes    Last attempt to quit: 01/06/2017    Years since quitting: 1.0  . Smokeless tobacco: Never Used  Substance and Sexual Activity  . Alcohol use: No  . Drug use: Yes    Types: Marijuana    Comment: occ marijuana use, last used 2 weeks ago  . Sexual  activity: Yes    Birth control/protection: None  Lifestyle  . Physical activity:    Days per week: Not on file    Minutes per session: Not on file  . Stress: Not on file  Relationships  . Social connections:    Talks on phone: Not on file    Gets together: Not on file    Attends religious service: Not on file    Active member of club or organization: Not on file    Attends meetings of clubs or organizations: Not on file    Relationship status: Not on file  Other Topics Concern  . Not on file  Social History Narrative  . Not on file    No current facility-administered medications on file prior to encounter.    Current Outpatient Medications on File Prior to Encounter  Medication Sig Dispense Refill  . amitriptyline (ELAVIL) 50 MG tablet Take 50 mg by mouth at bedtime as needed for sleep.    . Cholecalciferol (VITAMIN D3 GUMMIES ADULT PO)  Take 2 each by mouth daily.    . citalopram (CELEXA) 40 MG tablet Take 40 mg by mouth daily.    . Coenzyme Q10 (COQ-10) 50 MG CAPS Take 50 mg by mouth daily.     Marland Kitchen. EPINEPHrine (PRIMATENE MIST) 0.125 MG/ACT AERO Inhale 1 spray into the lungs daily as needed (shortness or breath, wheezing).    Marland Kitchen. lithium carbonate 300 MG capsule Take 300 mg by mouth 3 (three) times daily.    . metFORMIN (GLUCOPHAGE) 500 MG tablet Take 500 mg by mouth 2 (two) times daily with a meal.    . methylphenidate (RITALIN) 10 MG tablet Take 20 mg by mouth daily.  0  . Omega-3 Fatty Acids (FISH OIL) 1000 MG CAPS Take 1,000 mg by mouth daily.     . prazosin (MINIPRESS) 1 MG capsule Take 1 mg by mouth 2 (two) times daily as needed.   0  . Prenatal Vit-Min-FA-Fish Oil ([employer] PRENATAL GUMMY PO) Take 2 tablets by mouth daily.    Marland Kitchen. PROAIR RESPICLICK 108 (90 Base) MCG/ACT AEPB INHALE ONE PUFF BY MOUTH EVERY 4 HOURS  0  . ondansetron (ZOFRAN) 4 MG tablet Take 1 tablet (4 mg total) by mouth every 8 (eight) hours as needed for nausea or vomiting. (Patient not taking: Reported on 02/04/2018) 15 tablet 0  . polyethylene glycol powder (GLYCOLAX/MIRALAX) powder Take 17 g by mouth 2 (two) times daily as needed. (Patient not taking: Reported on 02/04/2018) 850 g 1    Review of Systems: a complete, 10pt review of systems was completed with pertinent positives and negatives as documented in the HPI  Physical Exam: Vitals:   02/04/18 2302  BP: (!) 151/90  Pulse: 65  Resp: 20  Temp: 98.7 F (37.1 C)  SpO2: 97%   Gen: A&Ox3, no distress  Head: normocephalic, atraumatic Eyes: extraocular motions intact, anicteric.  Neck: supple without mass or thyromegaly Chest: unlabored respirations, symmetrical air entry, clear bilaterally   Cardiovascular: RRR with palpable distal pulses, no pedal edema Abdomen: soft, nondistended, mildly diffusely tender. Incarcerated large firm right inguinal hernia which I was not able to reduce despite pain meds on  board. Tender at hernia. No overlying skin changes Extremities: warm, without edema, no deformities  Neuro: grossly intact Psych: appropriate mood and affect, normal insight  Skin: warm and dry   CBC Latest Ref Rng & Units 10/13/2017 05/25/2017 02/02/2017  WBC 3.8 - 10.8 Thousand/uL 6.2 14.1(H) 17.2(H)  Hemoglobin 13.2 -  17.1 g/dL 13.1(L) 9.9(L) 11.1(L)  Hematocrit 38.5 - 50.0 % 37.7(L) 28.5(L) 32.3(L)  Platelets 140 - 400 Thousand/uL 279 202 253    CMP Latest Ref Rng & Units 05/25/2017 02/02/2017 01/27/2017  Glucose 65 - 99 mg/dL 161(W) 960(A) 540(J)  BUN 6 - 20 mg/dL 12 8 11   Creatinine 0.61 - 1.24 mg/dL 8.11(B) 1.47 8.29  Sodium 135 - 145 mmol/L 137 136 138  Potassium 3.5 - 5.1 mmol/L 3.9 4.3 4.1  Chloride 101 - 111 mmol/L 104 106 109  CO2 22 - 32 mmol/L 26 26 21(L)  Calcium 8.9 - 10.3 mg/dL 8.1(L) 8.4(L) 9.0    Lab Results  Component Value Date   INR 1.80 (H) 11/27/2009    Imaging: No results found.    A/P: Incarcerated right inguinal hernia. I recommended proceeding to the OR this evening for open repair. Discussed possible need for laparotomy and bowel resection depending on intraoperative findings. Discussed use of mesh in the setting of viable bowel. Discussed risks of bleeding, infection, pain, scarring, injury to bowel, vessels, vas, or nerves in the area, chronic groin pain, hernia recurrence, etc. Questions welcomed and answered. Proceed to OR this evening.    Phylliss Blakes, MD [facility], [place] Pager [phone]

## 2018-02-04 NOTE — ED Notes (Signed)
Or provider in room

## 2018-02-04 NOTE — ED Notes (Signed)
Bed: [id number] Expected date:  Expected time:  Means of arrival:  Comments: 57 yo M/abd pain-htn 200systolic

## 2018-02-05 ENCOUNTER — Observation Stay (HOSPITAL_COMMUNITY): Payer: Medicare (Managed Care)

## 2018-02-05 ENCOUNTER — Observation Stay (HOSPITAL_COMMUNITY): Payer: Medicare (Managed Care) | Admitting: Certified Registered Nurse Anesthetist

## 2018-02-05 LAB — BASIC METABOLIC PANEL
Anion gap: 8 (ref 5–15)
Anion gap: 10 (ref 5–15)
BUN: 16 mg/dL (ref 6–20)
BUN: 12 mg/dL (ref 6–20)
CHLORIDE: 104 mmol/L (ref 101–111)
CHLORIDE: 105 mmol/L (ref 101–111)
CO2: 26 mmol/L (ref 22–32)
CO2: 21 mmol/L — AB (ref 22–32)
CREATININE: 0.8 mg/dL (ref 0.61–1.24)
CREATININE: 0.67 mg/dL (ref 0.61–1.24)
Calcium: 9.2 mg/dL (ref 8.9–10.3)
Calcium: 8.3 mg/dL — ABNORMAL LOW (ref 8.9–10.3)
GFR calc Af Amer: >60 mL/min (ref >60)
GFR calc non Af Amer: >60 mL/min (ref >60)
GFR calc non Af Amer: >60 mL/min (ref >60)
GLUCOSE: 167 mg/dL — AB (ref 65–99)
GLUCOSE: 225 mg/dL — AB (ref 65–99)
Potassium: 3.7 mmol/L (ref 3.5–5.1)
Potassium: 3.5 mmol/L (ref 3.5–5.1)
Sodium: 138 mmol/L (ref 135–145)
Sodium: 136 mmol/L (ref 135–145)

## 2018-02-05 LAB — CBC
HCT: 39.3 % (ref 39.0–52.0)
Hemoglobin: 13.4 g/dL (ref 13.0–17.0)
MCH: 31 pg (ref 26.0–34.0)
MCHC: 34.1 g/dL (ref 30.0–36.0)
MCV: 91 fL (ref 78.0–100.0)
PLATELETS: 259 10*3/uL (ref 150–400)
RBC: 4.32 MIL/uL (ref 4.22–5.81)
RDW: 13.9 % (ref 11.5–15.5)
WBC: 18.3 10*3/uL — ABNORMAL HIGH (ref 4.0–10.5)

## 2018-02-05 LAB — CBC WITH DIFFERENTIAL/PLATELET
Basophils Absolute: 0 10*3/uL (ref 0.0–0.1)
Basophils Relative: 0 %
EOS ABS: 0.1 10*3/uL (ref 0.0–0.7)
Eosinophils Relative: 1 %
HEMATOCRIT: 43.5 % (ref 39.0–52.0)
HEMOGLOBIN: 15 g/dL (ref 13.0–17.0)
LYMPHS ABS: 0.6 10*3/uL — AB (ref 0.7–4.0)
Lymphocytes Relative: 4 %
MCH: 31.3 pg (ref 26.0–34.0)
MCHC: 34.5 g/dL (ref 30.0–36.0)
MCV: 90.6 fL (ref 78.0–100.0)
MONOS PCT: 4 %
Monocytes Absolute: 0.7 10*3/uL (ref 0.1–1.0)
NEUTROS ABS: 13.5 10*3/uL — AB (ref 1.7–7.7)
NEUTROS PCT: 91 %
Platelets: 291 10*3/uL (ref 150–400)
RBC: 4.8 MIL/uL (ref 4.22–5.81)
RDW: 13.9 % (ref 11.5–15.5)
WBC: 15 10*3/uL — ABNORMAL HIGH (ref 4.0–10.5)

## 2018-02-05 LAB — GLUCOSE, CAPILLARY
GLUCOSE-CAPILLARY: 97 mg/dL (ref 65–99)
Glucose-Capillary: 188 mg/dL — ABNORMAL HIGH (ref 65–99)
Glucose-Capillary: 192 mg/dL — ABNORMAL HIGH (ref 65–99)
Glucose-Capillary: 133 mg/dL — ABNORMAL HIGH (ref 65–99)

## 2018-02-05 MED ORDER — SUGAMMADEX SODIUM 500 MG/5ML IV SOLN
INTRAVENOUS | Status: DC | PRN
  Administered 2018-02-05: 200 mg via INTRAVENOUS

## 2018-02-05 MED ORDER — LACTATED RINGERS IV SOLN
INTRAVENOUS | Status: DC | PRN
  Administered 2018-02-05 (×2): via INTRAVENOUS

## 2018-02-05 MED ORDER — PROMETHAZINE HCL 25 MG/ML IJ SOLN: 6.2500 mg | INTRAMUSCULAR | Status: DC | PRN

## 2018-02-05 MED ORDER — OXYCODONE HCL 5 MG PO TABS: 5.0000 mg | ORAL_TABLET | Freq: Once | ORAL | Status: DC | PRN

## 2018-02-05 MED ORDER — SUCCINYLCHOLINE CHLORIDE 200 MG/10ML IV SOSY
PREFILLED_SYRINGE | INTRAVENOUS | Status: DC | PRN
  Administered 2018-02-05: 120 mg via INTRAVENOUS

## 2018-02-05 MED ORDER — FENTANYL CITRATE (PF) 100 MCG/2ML IJ SOLN
INTRAMUSCULAR | Status: DC | PRN
  Administered 2018-02-05 (×5): 50 ug via INTRAVENOUS
  Administered 2018-02-05: 100 ug via INTRAVENOUS
  Administered 2018-02-05 (×3): 50 ug via INTRAVENOUS

## 2018-02-05 MED ORDER — ROCURONIUM BROMIDE 50 MG/5ML IV SOSY
PREFILLED_SYRINGE | INTRAVENOUS | Status: DC | PRN
  Administered 2018-02-05: 50 mg via INTRAVENOUS
  Administered 2018-02-05: 20 mg via INTRAVENOUS
  Administered 2018-02-05: 10 mg via INTRAVENOUS
  Administered 2018-02-05 (×3): 20 mg via INTRAVENOUS
  Administered 2018-02-05: 10 mg via INTRAVENOUS

## 2018-02-05 MED ORDER — HYDROMORPHONE HCL 1 MG/ML IJ SOLN
INTRAMUSCULAR | Status: AC
  Filled 2018-02-05: qty 1

## 2018-02-05 MED ORDER — MIDAZOLAM HCL 5 MG/5ML IJ SOLN
INTRAMUSCULAR | Status: DC | PRN
  Administered 2018-02-05: 2 mg via INTRAVENOUS

## 2018-02-05 MED ORDER — ALBUTEROL SULFATE HFA 108 (90 BASE) MCG/ACT IN AERS
INHALATION_SPRAY | RESPIRATORY_TRACT | Status: AC
  Filled 2018-02-05: qty 6.7

## 2018-02-05 MED ORDER — HYDROMORPHONE HCL 1 MG/ML IJ SOLN
0.2500 mg | INTRAMUSCULAR | Status: DC | PRN
  Administered 2018-02-05: 0.25 mg via INTRAVENOUS
  Administered 2018-02-05: 0.5 mg via INTRAVENOUS

## 2018-02-05 MED ORDER — DEXAMETHASONE SODIUM PHOSPHATE 10 MG/ML IJ SOLN
INTRAMUSCULAR | Status: DC | PRN
  Administered 2018-02-05: 10 mg via INTRAVENOUS

## 2018-02-05 MED ORDER — PROPOFOL 10 MG/ML IV BOLUS
INTRAVENOUS | Status: DC | PRN
  Administered 2018-02-05: 200 mg via INTRAVENOUS

## 2018-02-05 MED ORDER — LIDOCAINE 2% (20 MG/ML) 5 ML SYRINGE
INTRAMUSCULAR | Status: DC | PRN
  Administered 2018-02-05: 100 mg via INTRAVENOUS

## 2018-02-05 MED ORDER — METHYLENE BLUE 0.5 % INJ SOLN
INTRAVENOUS | Status: AC
  Filled 2018-02-05: qty 10

## 2018-02-05 MED ORDER — SODIUM CHLORIDE 0.9 % IR SOLN
Status: DC | PRN
  Administered 2018-02-05: 1000 mL via INTRAVESICAL
  Administered 2018-02-05: 1000 mL

## 2018-02-05 MED ORDER — METHYLENE BLUE 0.5 % INJ SOLN
INTRAVENOUS | Status: DC | PRN
  Administered 2018-02-05: 10 mL

## 2018-02-05 MED ORDER — OXYCODONE HCL 5 MG/5ML PO SOLN: 5.0000 mg | Freq: Once | ORAL | Status: DC | PRN

## 2018-02-05 MED ORDER — SUGAMMADEX SODIUM 500 MG/5ML IV SOLN
INTRAVENOUS | Status: AC
  Filled 2018-02-05: qty 5

## 2018-02-05 MED ORDER — ALBUTEROL SULFATE HFA 108 (90 BASE) MCG/ACT IN AERS
INHALATION_SPRAY | RESPIRATORY_TRACT | Status: DC | PRN
  Administered 2018-02-05 (×2): 2 via RESPIRATORY_TRACT

## 2018-02-05 MED ORDER — ONDANSETRON HCL 4 MG/2ML IJ SOLN
INTRAMUSCULAR | Status: DC | PRN
  Administered 2018-02-05: 4 mg via INTRAVENOUS

## 2018-02-05 MED ORDER — BUPIVACAINE-EPINEPHRINE 0.5% -1:200000 IJ SOLN
INTRAMUSCULAR | Status: DC | PRN
  Administered 2018-02-05: 50 mL

## 2018-02-05 MED ORDER — FENTANYL CITRATE (PF) 250 MCG/5ML IJ SOLN
INTRAMUSCULAR | Status: AC
  Filled 2018-02-05: qty 5

## 2018-02-05 NOTE — Progress Notes (Signed)
[facility] Office:  [phone] General Surgery Progress Note   LOS: 0 days  POD -  1 Day Post-Op  Chief Complaint: Abdominal pain  Assessment and Plan: 1. Right HERNIA REPAIR INGUINAL ADULT, SMALL BOWEL RESECTION, EXPLORATORY LAPAROTOMY - 02/05/2018 - Fredricka Bonineonnor  Continue NPO  2.  Transgender woman 3.  PTSD 4.  DM 5.  DVT prophylaxis - Lovenox 6.  Has foley - will probably remove tomorrow   Active Problems:   Incarcerated inguinal hernia   Subjective:  Doing okay.  Sore from surgery.  Not nauseated  Objective:   Vitals:   02/05/18 0639 02/05/18 0815  BP: (!) 162/63 (!) 155/77  Pulse: 79 74  Resp: 17 16  Temp: 98.6 F (37 C) 99 F (37.2 C)  SpO2: 99% 97%     Intake/Output from previous day:  03/30 0701 - 03/31 0700 In: 1900 [I.V.:1900] Out: 900 [Urine:800; Blood:100]  Intake/Output this shift:  Total I/O In: -  Out: 425 [Urine:425]   Physical Exam:   General: WN person who is alert and oriented.    HEENT: Normal. Pupils equal. .   Lungs: Clear - IS = 1,200 cc   Abdomen: Soft.  Has a few BS   Wound: Dressings intact   Lab Results:    Recent Labs    02/04/18 2230 02/05/18 0544  WBC 15.0* 18.3*  HGB 15.0 13.4  HCT 43.5 39.3  PLT 291 259    BMET   Recent Labs    02/04/18 2230 02/05/18 0544  NA 138 136  K 3.7 3.5  CL 104 105  CO2 26 21*  GLUCOSE 167* 225*  BUN 16 12  CREATININE 0.80 0.67  CALCIUM 9.2 8.3*    PT/INR  No results for input(s): LABPROT, INR in the last 72 hours.  ABG  No results for input(s): PHART, HCO3 in the last 72 hours.  Invalid input(s): PCO2, PO2   Studies/Results:  X-ray Abdomen Ap  Result Date: 02/05/2018 CLINICAL DATA:  57 year old male with NG tube placement. EXAM: ABDOMEN - 1 VIEW COMPARISON:  None. FINDINGS: An enteric tube partially visualized in the mid thoracic spine over the mediastinum. The tube folds back and soft in the esophagus and extends superiorly with tip beyond the upper margin of  the image. Recommend retraction and repositioning. IMPRESSION: Partially visualized enteric tube folds on itself at the level of the carina and extends superiorly. Recommend retraction and repositioning. Electronically Signed   By: Elgie CollardArash  Radparvar M.D.   On: 02/05/2018 04:07     Anti-infectives:   Anti-infectives (From admission, onward)   Start     Dose/Rate Route Frequency Ordered Stop   02/04/18 2315  ceFAZolin (ANCEF) IVPB 2g/100 mL premix     2 g 200 mL/hr over 30 Minutes Intravenous  Once 02/04/18 2311 02/05/18 0100      Ovidio Kinavid Kimble Delaurentis, MD, FACS Pager: [phone] [facility] Office: [phone] 02/05/2018

## 2018-02-05 NOTE — Transfer of Care (Signed)
Immediate Anesthesia Transfer of Care Note  Patient: Corey PeerChristopher R Barraclough  Procedure(s) Performed: HERNIA REPAIR INGUINAL ADULT (Right ) SMALL BOWEL RESECTION EXPLORATORY LAPAROTOMY  Patient Location: PACU  Anesthesia Type:General  Level of Consciousness: awake, alert  and patient cooperative  Airway & Oxygen Therapy: Patient Spontanous Breathing and Patient connected to face mask oxygen  Post-op Assessment: Report given to RN and Post -op Vital signs reviewed and stable  Post vital signs: Reviewed and stable  Last Vitals:  Vitals Value Taken Time  BP    Temp    Pulse    Resp    SpO2      Last Pain:  Vitals:   02/04/18 2302  TempSrc: Oral  PainSc: 3          Complications: No apparent anesthesia complications

## 2018-02-05 NOTE — Anesthesia Procedure Notes (Signed)
Procedure Name: Intubation Date/Time: 02/05/2018 12:29 AM Performed by: West Pugh, CRNA Pre-anesthesia Checklist: Patient identified, Emergency Drugs available, Suction available, Patient being monitored and Timeout performed Patient Re-evaluated:Patient Re-evaluated prior to induction Oxygen Delivery Method: Circle system utilized Preoxygenation: Pre-oxygenation with 100% oxygen Induction Type: IV induction, Rapid sequence and Cricoid Pressure applied Laryngoscope Size: Mac and 4 Grade View: Grade II Tube type: Oral Tube size: 7.5 mm Number of attempts: 1 Airway Equipment and Method: Stylet Placement Confirmation: ETT inserted through vocal cords under direct vision,  positive ETCO2,  CO2 detector and breath sounds checked- equal and bilateral Secured at: 22 cm Tube secured with: Tape Dental Injury: Teeth and Oropharynx as per pre-operative assessment

## 2018-02-05 NOTE — Op Note (Signed)
Operative Note  Corey Collins  [id number]  [id number]  02/05/2018   Surgeon: Leeroy Bock A ConnorMD  Assistant: OR staff  Procedure performed: Open right inguinal hernia repair (tissue), exploratory laparotomy, small bowel resection  Preop diagnosis: Incarcerated right inguinal hernia, recurrent Post-op diagnosis/intraop findings: Incarcerated recurrent inguinal hernia which was in the indirect space but defect medial to the spermatic cord, defect measured approximately 2-1/2 cm in diameter but had a large chronically incarcerated loop of small bowel which when reduced during laparotomy was found to have patches of necrosis and surrounding hemorrhagic fluid  Specimens: Small bowel resection Retained items: none EBL: 100cc Complications: none  Description of procedure: After obtaining informed consent the patient was taken to the operating room and placed supine on operating room table wheregeneral endotracheal anesthesia was initiated, preoperative antibiotics were administered, SCDs applied, and a formal timeout was performed.  A Foley catheter was inserted as well as an OG tube.  This was converted to an NG tube at the end.  We began with an oblique incision in the right groin 1 cm superior to the inguinal ligament the soft tissues and Scarpa's were dissected with cautery until the external oblique aponeurosis could be identified and divided sharply.  We were able to identify the hernia sac and bluntly freed from its attachments to the subcutaneous tissues inferiorly and bring it up into the field.  This narrowed down to approximately 2-1/2 cm neck but was tensely distended and very tight at its exit from the internal ring.  The sac was carefully opened revealing hemorrhagic fluid and dusky appearing bowel.  I was not able to reduce this.  I therefore created a low midline laparotomy and was able to reduce the bowel into the abdominal cavity confirming an approximately 35 cm chronically  incarcerated segment which was knotted with dense interloop adhesions and had several patches of necrosis.  This was resected using serial firings of the blue load linear cutting stapler.  A side-to-side functional end-to-end anastomosis was created again using the 60 mm blue load linear cutter stapler with the common enterotomy closed with a TA 60 blue.  A tension relieving suture of 3-0 silk was placed at the apex of the suture line.  Staple line bleeding was controlled with cautery and with suture ligature.  The mesenteric defect was closed with interrupted figure-of-eight sutures of 3-0 silk.  This was reduced into the abdomen and small bowel was run from the ligament of Treitz to the ileocecal valve with no other obstruction or abnormality identified.  I then returned to the hernia sac at the groin.  This was chronically thickened and encased in the preperitoneal fat.  I filled the bladder with 300 cc of saline and 10 cc of methylene blue and confirmed that there was no injury to the bladder or communication of the bladder with herniated structures.  The hernia sac was ligated with a pursestring suture of 0 Vicryl and reduced into the abdomen.  The thickened preperitoneal fat that was present medially was reduced as well as I did not want to incur any undue injury to the bladder.  Given the necrotic patches on the small bowel a elected not to use mesh.  A tissue repair was performed, narrowing the internal ring at the medial aspect after confirming no injury to the cord structures; interrupted figure-of-eight sutures of 0 Vicryl were used to reapproximate the inguinal ligament inferiorly to the conjoined tendon of the internal oblique and transversus abdominis superiorly.  The wound  was irrigated and the external oblique was reapproximated with a running 3-0 Vicryl.  The Scarpa's fascia was reapproximated with interrupted 3-0 Vicryl and the skin was closed with staples.  I then returned to the peritoneal cavity  and confirmed adequate ligation of the hernia sac internally.  The abdomen was irrigated with warm sterile saline.  The omentum was brought down over the viscera.  The midline incision was then closed with running single strand #1 PDS starting at each end and tying in the middle.  The wound was irrigated and then closed with staples.  The dressing was were applied.  The patient was then awakened, extubated and taken to PACU in stable condition.  He will retain his Foley catheter and NG tube for the early postop recovery period.  All counts were correct at the completion of the case.

## 2018-02-05 NOTE — Progress Notes (Signed)
Pt received to room via stretcher from PACU. Pt responds to verbal stimuli, but immediately drifts back off to sleep. VSS. Resp even and unlabored. Abdominal dressing intact with two small shadow spots of drainage along center of midline of incision. Foley catheter intact to bedside drainage. No acute distress noted at this time. Will continue to monitor.

## 2018-02-05 NOTE — Anesthesia Postprocedure Evaluation (Signed)
Anesthesia Post Note  Patient: Deirdre PeerChristopher R Dubberly  Procedure(s) Performed: HERNIA REPAIR INGUINAL ADULT (Right ) SMALL BOWEL RESECTION EXPLORATORY LAPAROTOMY     Patient location during evaluation: PACU Anesthesia Type: General Level of consciousness: awake and alert Pain management: pain level controlled Vital Signs Assessment: post-procedure vital signs reviewed and stable Respiratory status: spontaneous breathing, nonlabored ventilation and respiratory function stable Cardiovascular status: blood pressure returned to baseline and stable Postop Assessment: no apparent nausea or vomiting Anesthetic complications: no    Last Vitals:  Vitals:   02/04/18 2302 02/05/18 0330  BP: (!) 151/90 (!) 183/82  Pulse: 65 96  Resp: 20 (!) 22  Temp: 37.1 C 36.7 C  SpO2: 97% 94%    Last Pain:  Vitals:   02/05/18 0330  TempSrc:   PainSc: 0-No pain                 Lowella CurbWarren Ray Valente Fosberg

## 2018-02-06 ENCOUNTER — Other Ambulatory Visit: Payer: Self-pay

## 2018-02-06 ENCOUNTER — Encounter (HOSPITAL_COMMUNITY): Payer: Self-pay | Admitting: Surgery

## 2018-02-06 DIAGNOSIS — Z79899 Other long term (current) drug therapy: Secondary | ICD-10-CM | POA: Diagnosis not present

## 2018-02-06 DIAGNOSIS — F4310 Post-traumatic stress disorder, unspecified: Secondary | ICD-10-CM | POA: Diagnosis present

## 2018-02-06 DIAGNOSIS — J45909 Unspecified asthma, uncomplicated: Secondary | ICD-10-CM | POA: Diagnosis present

## 2018-02-06 DIAGNOSIS — Z7984 Long term (current) use of oral hypoglycemic drugs: Secondary | ICD-10-CM | POA: Diagnosis not present

## 2018-02-06 DIAGNOSIS — K420 Umbilical hernia with obstruction, without gangrene: Secondary | ICD-10-CM | POA: Diagnosis present

## 2018-02-06 DIAGNOSIS — F17210 Nicotine dependence, cigarettes, uncomplicated: Secondary | ICD-10-CM | POA: Diagnosis present

## 2018-02-06 DIAGNOSIS — Z882 Allergy status to sulfonamides status: Secondary | ICD-10-CM | POA: Diagnosis not present

## 2018-02-06 DIAGNOSIS — Z86718 Personal history of other venous thrombosis and embolism: Secondary | ICD-10-CM | POA: Diagnosis not present

## 2018-02-06 DIAGNOSIS — Z9049 Acquired absence of other specified parts of digestive tract: Secondary | ICD-10-CM | POA: Diagnosis not present

## 2018-02-06 DIAGNOSIS — K4030 Unilateral inguinal hernia, with obstruction, without gangrene, not specified as recurrent: Secondary | ICD-10-CM | POA: Diagnosis present

## 2018-02-06 DIAGNOSIS — K4031 Unilateral inguinal hernia, with obstruction, without gangrene, recurrent: Secondary | ICD-10-CM | POA: Diagnosis present

## 2018-02-06 DIAGNOSIS — K660 Peritoneal adhesions (postprocedural) (postinfection): Secondary | ICD-10-CM | POA: Diagnosis present

## 2018-02-06 DIAGNOSIS — F649 Gender identity disorder, unspecified: Secondary | ICD-10-CM | POA: Diagnosis present

## 2018-02-06 DIAGNOSIS — Z96643 Presence of artificial hip joint, bilateral: Secondary | ICD-10-CM | POA: Diagnosis present

## 2018-02-06 DIAGNOSIS — E119 Type 2 diabetes mellitus without complications: Secondary | ICD-10-CM | POA: Diagnosis present

## 2018-02-06 DIAGNOSIS — M1990 Unspecified osteoarthritis, unspecified site: Secondary | ICD-10-CM | POA: Diagnosis present

## 2018-02-06 LAB — GLUCOSE, CAPILLARY
GLUCOSE-CAPILLARY: 101 mg/dL — AB (ref 65–99)
GLUCOSE-CAPILLARY: 155 mg/dL — AB (ref 65–99)
GLUCOSE-CAPILLARY: 133 mg/dL — AB (ref 65–99)
GLUCOSE-CAPILLARY: 145 mg/dL — AB (ref 65–99)
Glucose-Capillary: 106 mg/dL — ABNORMAL HIGH (ref 65–99)
Glucose-Capillary: 113 mg/dL — ABNORMAL HIGH (ref 65–99)

## 2018-02-06 LAB — CBC WITH DIFFERENTIAL/PLATELET
BASOS PCT: 0 %
Basophils Absolute: 0 10*3/uL (ref 0.0–0.1)
EOS ABS: 0.1 10*3/uL (ref 0.0–0.7)
Eosinophils Relative: 1 %
HCT: 32.7 % — ABNORMAL LOW (ref 39.0–52.0)
Hemoglobin: 10.9 g/dL — ABNORMAL LOW (ref 13.0–17.0)
Lymphocytes Relative: 12 %
Lymphs Abs: 1.5 10*3/uL (ref 0.7–4.0)
MCH: 30.5 pg (ref 26.0–34.0)
MCHC: 33.3 g/dL (ref 30.0–36.0)
MCV: 91.6 fL (ref 78.0–100.0)
MONO ABS: 1 10*3/uL (ref 0.1–1.0)
MONOS PCT: 8 %
Neutro Abs: 10.2 10*3/uL — ABNORMAL HIGH (ref 1.7–7.7)
Neutrophils Relative %: 79 %
Platelets: 229 10*3/uL (ref 150–400)
RBC: 3.57 MIL/uL — ABNORMAL LOW (ref 4.22–5.81)
RDW: 14.1 % (ref 11.5–15.5)
WBC: 12.8 10*3/uL — ABNORMAL HIGH (ref 4.0–10.5)

## 2018-02-06 LAB — BASIC METABOLIC PANEL
Anion gap: 7 (ref 5–15)
BUN: 10 mg/dL (ref 6–20)
CALCIUM: 8.2 mg/dL — AB (ref 8.9–10.3)
CO2: 24 mmol/L (ref 22–32)
CREATININE: 0.54 mg/dL — AB (ref 0.61–1.24)
Chloride: 107 mmol/L (ref 101–111)
GFR calc non Af Amer: >60 mL/min (ref >60)
Glucose, Bld: 103 mg/dL — ABNORMAL HIGH (ref 65–99)
Potassium: 3.3 mmol/L — ABNORMAL LOW (ref 3.5–5.1)
SODIUM: 138 mmol/L (ref 135–145)

## 2018-02-06 NOTE — Progress Notes (Signed)
2 Days Post-Op   Subjective/Chief Complaint: Comfortable Denies nausea   Objective: Vital signs in last 24 hours: Temp:  [98.3 F (36.8 C)-98.8 F (37.1 C)] 98.6 F (37 C) (04/01 0612) Pulse Rate:  [66-77] 68 (04/01 0612) Resp:  [18] 18 (04/01 0612) BP: (114-128)/(52-70) 114/52 (04/01 0612) SpO2:  [93 %-97 %] 94 % (04/01 0612) Last BM Date: 02/06/18  Intake/Output from previous day: 03/31 0701 - 04/01 0700 In: 2362.5 [P.O.:150; I.V.:2212.5] Out: 2400 [Urine:2400] Intake/Output this shift: No intake/output data recorded.  Exam: Looks comfortable Abdomen soft, non-distended, dressing dry  Lab Results:  Recent Labs    02/05/18 0544 02/06/18 0447  WBC 18.3* 12.8*  HGB 13.4 10.9*  HCT 39.3 32.7*  PLT 259 229   BMET Recent Labs    02/05/18 0544 02/06/18 0447  NA 136 138  K 3.5 3.3*  CL 105 107  CO2 21* 24  GLUCOSE 225* 103*  BUN 12 10  CREATININE 0.67 0.54*  CALCIUM 8.3* 8.2*   PT/INR No results for input(s): LABPROT, INR in the last 72 hours. ABG No results for input(s): PHART, HCO3 in the last 72 hours.  Invalid input(s): PCO2, PO2  Studies/Results: X-ray Abdomen Ap  Result Date: 02/05/2018 CLINICAL DATA:  57 year old male with NG tube placement. EXAM: ABDOMEN - 1 VIEW COMPARISON:  None. FINDINGS: An enteric tube partially visualized in the mid thoracic spine over the mediastinum. The tube folds back and soft in the esophagus and extends superiorly with tip beyond the upper margin of the image. Recommend retraction and repositioning. IMPRESSION: Partially visualized enteric tube folds on itself at the level of the carina and extends superiorly. Recommend retraction and repositioning. Electronically Signed   By: Elgie CollardArash  Radparvar M.D.   On: 02/05/2018 04:07    Anti-infectives: Anti-infectives (From admission, onward)   Start     Dose/Rate Route Frequency Ordered Stop   02/04/18 2315  ceFAZolin (ANCEF) IVPB 2g/100 mL premix     2 g 200 mL/hr over 30  Minutes Intravenous  Once 02/04/18 2311 02/05/18 0100      Assessment/Plan: s/p Procedure(s): HERNIA REPAIR INGUINAL ADULT (Right) SMALL BOWEL RESECTION EXPLORATORY LAPAROTOMY  D/c foley Start clear liquid diet Ambulate   LOS: 0 days    Corey Collins A 02/06/2018

## 2018-02-07 LAB — GLUCOSE, CAPILLARY
GLUCOSE-CAPILLARY: 83 mg/dL (ref 65–99)
GLUCOSE-CAPILLARY: 151 mg/dL — AB (ref 65–99)
Glucose-Capillary: 111 mg/dL — ABNORMAL HIGH (ref 65–99)
Glucose-Capillary: 152 mg/dL — ABNORMAL HIGH (ref 65–99)
Glucose-Capillary: 180 mg/dL — ABNORMAL HIGH (ref 65–99)
Glucose-Capillary: 70 mg/dL (ref 65–99)
Glucose-Capillary: 88 mg/dL (ref 65–99)

## 2018-02-07 NOTE — Progress Notes (Signed)
[facility] Progress Note  3 Days Post-Op  Subjective: CC:  Rates abdominal soreness as 1/10 at rest and slightly more with coughing/moving. Controlled on PO meds. Tolerating liquids. Denies N/V. +flatus. Denies BM. Pulling 1750 cc on IS. Urinating without issues.  IV came out overnight Objective: Vital signs in last 24 hours: Temp:  [97.9 F (36.6 C)-98.4 F (36.9 C)] 98.3 F (36.8 C) (04/02 0414) Pulse Rate:  [57-66] 57 (04/02 0414) Resp:  [18] 18 (04/02 0414) BP: (102-128)/(56-66) 102/62 (04/02 0414) SpO2:  [97 %-99 %] 99 % (04/02 0414) Last BM Date: 02/04/18  Intake/Output from previous day: 04/01 0701 - 04/02 0700 In: 2415 [P.O.:240; I.V.:2175] Out: 1200 [Urine:1200] Intake/Output this shift: No intake/output data recorded.  PE: Gen:  Alert, NAD, pleasant Card:  Regular rate and rhythm Pulm:  Normal effort Abd: Soft, appropriately tender, dressing removed and midline/R inguinal staples are c/d/i without surrounding cellulitis or active bleeding.  Skin: warm and dry, no rashes  Psych: A&Ox3   Lab Results:  Recent Labs    02/05/18 0544 02/06/18 0447  WBC 18.3* 12.8*  HGB 13.4 10.9*  HCT 39.3 32.7*  PLT 259 229   BMET Recent Labs    02/05/18 0544 02/06/18 0447  NA 136 138  K 3.5 3.3*  CL 105 107  CO2 21* 24  GLUCOSE 225* 103*  BUN 12 10  CREATININE 0.67 0.54*  CALCIUM 8.3* 8.2*   PT/INR No results for input(s): LABPROT, INR in the last 72 hours. CMP     Component Value Date/Time   NA 138 02/06/2018 0447   K 3.3 (L) 02/06/2018 0447   CL 107 02/06/2018 0447   CO2 24 02/06/2018 0447   GLUCOSE 103 (H) 02/06/2018 0447   BUN 10 02/06/2018 0447   CREATININE 0.54 (L) 02/06/2018 0447   CALCIUM 8.2 (L) 02/06/2018 0447   GFRNONAA >60 02/06/2018 0447   GFRAA >60 02/06/2018 0447   Lipase  No results found for: LIPASE     Studies/Results: No results found.  Anti-infectives: Anti-infectives (From admission, onward)   Start      Dose/Rate Route Frequency Ordered Stop   02/04/18 2315  ceFAZolin (ANCEF) IVPB 2g/100 mL premix     2 g 200 mL/hr over 30 Minutes Intravenous  Once 02/04/18 2311 02/05/18 0100       Assessment/Plan Transgender male PTSD DM  SBO POD#2 s/p Procedure(s): HERNIA REPAIR INGUINAL ADULT (Right) SMALL BOWEL RESECTION EXPLORATORY LAPAROTOMY - afebrile, VSS - advance diet to full liquids  - await further bowel function - IS/mobilize   FEN: full lquids  ID: perioperative Ancef 3/30 VTE: Lovenox Foley: removed POD#1   Dispo: anticipate discharge tomorrow or Friday if tolerating diet and has a BM .   LOS: 1 day    Adam PhenixElizabeth S Keiva Dina , [facility] 02/07/2018, 8:07 AM Pager: [phone] Consults: [phone] Mon-Fri 7:00 am-4:30 pm Sat-Sun 7:00 am-11:30 am

## 2018-02-07 NOTE — Discharge Summary (Signed)
[facility] Discharge Summary   Patient ID: Corey Collins MRN: [id number] DOB/AGE: [birth year] 57 y.o.  Admit date: 02/04/2018 Discharge date: 02/08/2018  Discharge Diagnosis Patient Active Problem List   Diagnosis Date Noted  . Incarcerated umbilical hernia 02/06/2018  . Incarcerated inguinal hernia 02/04/2018  . Right inguinal hernia 12/02/2017  . Anxiety 10/11/2017  . Overweight (BMI 25.0-29.9) 05/25/2017  . S/P right THA, AA 05/24/2017  . 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 Allergic asthma 12/15/2016  . Cigarette nicotine dependence without complication 12/15/2016  . Major depression, chronic 12/15/2016  . Male-to-male transgender person 12/15/2016  . PTSD (post-traumatic stress disorder) 12/15/2016    Consultants None  Imaging: No results found.  Procedures Dr. Phylliss Blakes (02/05/18) - Open right inguinal hernia repair (tissue), exploratory laparotomy, small bowel resection  Hospital Course:  The patient is a 57 year old transgender male who presented to the [facility] emergency department on 02/05/18 with an pain over a right inguinal hernia.  Associated symptoms included vomiting.  Patient found to have abdominal tenderness on exam as well as a large, firm, incarcerated right inguinal hernia that was unable to be reduced at the bedside.  The patient was admitted to the hospital and underwent the procedure above without any complications.  On postop day #1 the patient was started on clear liquids.  The patient's vitals remained stable postoperatively, his diet was advanced as tolerated, his bowel function returned, his pain was controlled on oral medications, and he was mobilizing in the hallways. On POD#2 the patient was medically stable for discharge.  He will require follow-up in our office for staple removal  in postoperative follow-up with Dr. Doylene Canard as below.  He knows to call with questions or concerns.  Physical Exam: General:  Alert, NAD, pleasant,  comfortable Abd:  Soft, appropriately tender, +BS, midline and right inguinal incisions c/d/i with staples in place.  Allergies as of 02/08/2018      Reactions   Sulfa Antibiotics Other (See Comments)   Family history, took in [place] in [id number] did ok with      Medication List    STOP taking these medications   ondansetron 4 MG tablet Commonly known as:  ZOFRAN     TAKE these medications   acetaminophen 500 MG tablet Commonly known as:  TYLENOL Take 2 tablets (1,000 mg total) by mouth every 8 (eight) hours as needed.   amitriptyline 50 MG tablet Commonly known as:  ELAVIL Take 50 mg by mouth at bedtime as needed for sleep.   citalopram 40 MG tablet Commonly known as:  CELEXA Take 40 mg by mouth daily.   CoQ-10 50 MG Caps Take 50 mg by mouth daily.   CVS PRENATAL GUMMY PO Take 2 tablets by mouth daily.   Fish Oil 1000 MG Caps Take 1,000 mg by mouth daily.   ibuprofen 600 MG tablet Commonly known as:  ADVIL,MOTRIN Take 1 tablet (600 mg total) by mouth every 6 (six) hours as needed for mild pain.   lithium carbonate 300 MG capsule Take 300 mg by mouth 3 (three) times daily.   metFORMIN 500 MG tablet Commonly known as:  GLUCOPHAGE Take 500 mg by mouth 2 (two) times daily with a meal.   methylphenidate 10 MG tablet Commonly known as:  RITALIN Take 20 mg by mouth daily.   oxyCODONE 5 MG immediate release tablet Commonly known as:  Oxy IR/ROXICODONE Take 1 tablet (5 mg total) by mouth every 6 (six) hours as needed for moderate  pain.   polyethylene glycol powder powder Commonly known as:  GLYCOLAX/MIRALAX Take 17 g by mouth 2 (two) times daily as needed.   prazosin 1 MG capsule Commonly known as:  MINIPRESS Take 1 mg by mouth 2 (two) times daily as needed.   PRIMATENE MIST 0.125 MG/ACT Aero Generic drug:  EPINEPHrine Inhale 1 spray into the lungs daily as needed (shortness or breath, wheezing).   PROAIR RESPICLICK 108 (90 Base) MCG/ACT Aepb Generic drug:   Albuterol Sulfate INHALE ONE PUFF BY MOUTH EVERY 4 HOURS   VITAMIN D3 GUMMIES ADULT PO Take 2 each by mouth daily.        Follow-up Information    [facility] [place] Go on 02/17/2018.   Specialty:  General Surgery Why:  at 2:00 PM for staple removal, please arrive by 1:30 PM to get checked in and fill out any necsesary paperwork. Contact information: [address] [place] [id number] [phone]       Berna Bueonnor, Chelsea A, MD. Call.   Specialty:  General Surgery Why:  as soon as possible to schedule a post-operative appointment after your hernia repair and bowel resection Contact information: [address] [place] [id number] [phone]           Signed: Hosie SpangleElizabeth Simaan, [facility] 02/08/2018, 8:59 AM Pager: [phone] Consults: [phone] Mon-Fri 7:00 am-4:30 pm Sat-Sun 7:00 am-11:30 am

## 2018-02-07 NOTE — Progress Notes (Signed)
   02/07/18 1300 02/07/18 1415  Unmeasured Output  Stool Occurrence 1 1  Stool Characteristics  Bowel Incontinence No No  Stool Type Type 6 (Mushy consistency with ragged edges) Type 6 (Mushy consistency with ragged edges)  Stool Descriptors Quentin OreBrown Brown  Stool Amount Medium (per patient; ) Small (per patient)  Stool Source Rectum Rectum

## 2018-02-08 LAB — GLUCOSE, CAPILLARY
GLUCOSE-CAPILLARY: 84 mg/dL (ref 65–99)
GLUCOSE-CAPILLARY: 87 mg/dL (ref 65–99)
GLUCOSE-CAPILLARY: 117 mg/dL — AB (ref 65–99)

## 2018-02-08 MED ORDER — ACETAMINOPHEN 500 MG PO TABS: 1000.0000 mg | ORAL_TABLET | Freq: Three times a day (TID) | ORAL | 0 refills | Status: DC | PRN

## 2018-02-08 MED ORDER — OXYCODONE HCL 5 MG PO TABS: 5.0000 mg | ORAL_TABLET | Freq: Four times a day (QID) | ORAL | 0 refills | Status: DC | PRN

## 2018-02-08 MED ORDER — IBUPROFEN 600 MG PO TABS: 600.0000 mg | ORAL_TABLET | Freq: Four times a day (QID) | ORAL | 0 refills | Status: DC | PRN

## 2018-02-08 NOTE — Discharge Instructions (Signed)
[facility]      [facility] °[phone] ° °OPEN ABDOMINAL SURGERY: POST OP INSTRUCTIONS ° °Always review your discharge instruction sheet given to you by the facility where your surgery was performed. ° °IF YOU HAVE DISABILITY OR FAMILY LEAVE FORMS, YOU MUST BRING THEM TO THE OFFICE FOR PROCESSING.  PLEASE DO NOT GIVE THEM TO YOUR DOCTOR. ° °1. A prescription for pain medication may be given to you upon discharge.  Take your pain medication as prescribed, if needed.  If narcotic pain medicine is not needed, then you may take acetaminophen (Tylenol) or ibuprofen (Advil) as needed. °2. Take your usually prescribed medications unless otherwise directed. °3. If you need a refill on your pain medication, please contact your pharmacy. They will contact our office to request authorization.  Prescriptions will not be filled after 5pm or on week-ends. °4. You should follow a light diet the first few days after arrival home, such as soup and crackers, pudding, etc.unless your doctor has advised otherwise. A high-fiber, low fat diet can be resumed as tolerated.   Be sure to include lots of fluids daily. Most patients will experience some swelling and bruising on the chest and neck area.  Ice packs will help.  Swelling and bruising can take several days to resolve °5. Most patients will experience some swelling and bruising in the area of the incision. Ice pack will help. Swelling and bruising can take several days to resolve..  °6. It is common to experience some constipation if taking pain medication after surgery.  Increasing fluid intake and taking a stool softener will usually help or prevent this problem from occurring.  A mild laxative (Milk of Magnesia or Miralax) should be taken according to package directions if there are no bowel movements after 48 hours. °7.  You may have steri-strips (small skin tapes) in place directly over the incision.  These strips should be left on the skin for 7-10 days.  If your  surgeon used skin glue on the incision, you may shower in 24 hours.  The glue will flake off over the next 2-3 weeks.  Any sutures or staples will be removed at the office during your follow-up visit. You may find that a light gauze bandage over your incision may keep your staples from being rubbed or pulled. You may shower and replace the bandage daily. °8. ACTIVITIES:  You may resume regular (light) daily activities beginning the next day--such as daily self-care, walking, climbing stairs--gradually increasing activities as tolerated.  You may have sexual intercourse when it is comfortable.  Refrain from any heavy lifting or straining until approved by your doctor. °a. You may drive when you no longer are taking prescription pain medication, you can comfortably wear a seatbelt, and you can safely maneuver your car and apply brakes °b. Return to Work: ___________________________________ °9. You should see your doctor in the office for a follow-up appointment approximately two weeks after your surgery.  Make sure that you call for this appointment within a day or two after you arrive home to insure a convenient appointment time. °OTHER INSTRUCTIONS:  °_____________________________________________________________ °_____________________________________________________________ ° °WHEN TO CALL YOUR DOCTOR: °1. Fever over 101.0 °2. Inability to urinate °3. Nausea and/or vomiting °4. Extreme swelling or bruising °5. Continued bleeding from incision. °6. Increased pain, redness, or drainage from the incision. °7. Difficulty swallowing or breathing °8. Muscle cramping or spasms. °9. Numbness or tingling in hands or feet or around lips. ° °The clinic staff is available to   answer your questions during regular business hours.  Please dont hesitate to call and ask to speak to one of the nurses if you have concerns.  For further questions, please visit [email or web address]  Low-Fiber Diet - follow for 1-2  weeks. Fiber is found in fruits, vegetables, and whole grains. A low-fiber diet restricts fibrous foods that are not digested in the small intestine. A diet containing about 10-15 grams of fiber per day is considered low fiber. Low-fiber diets may be used to:  Promote healing and rest the bowel during intestinal flare-ups.  Prevent blockage of a partially obstructed or narrowed gastrointestinal tract.  Reduce fecal weight and volume.  Slow the movement of feces.  You may be on a low-fiber diet as a transitional diet following surgery, after an injury (trauma), or because of a short (acute) or lifelong (chronic) illness. Your health care provider will determine the length of time you need to stay on this diet. What do I need to know about a low-fiber diet? Always check the fiber content on the packaging's Nutrition Facts label, especially on foods from the grains list. Ask your dietitian if you have questions about specific foods that are related to your condition, especially if the food is not listed below. In general, a low-fiber food will have less than 2 g of fiber. What foods can I eat? Grains All breads and crackers made with white flour. Sweet rolls, doughnuts, waffles, pancakes, [place] toast, bagels. Pretzels, Melba toast, zwieback. Well-cooked cereals, such as cornmeal, farina, or cream cereals. Dry cereals that do not contain whole grains, fruit, or nuts, such as refined corn, wheat, rice, and oat cereals. Potatoes prepared any way without skins, plain pastas and noodles, refined white rice. Use white flour for baking and making sauces. Use allowed list of grains for casseroles, dumplings, and puddings. Vegetables Strained tomato and vegetable juices. Fresh lettuce, cucumber, spinach. Well-cooked (no skin or pulp) or canned vegetables, such as asparagus, bean sprouts, beets, carrots, green beans, mushrooms, potatoes, pumpkin, spinach, yellow squash, tomato sauce/puree, turnips, yams, and  zucchini. Keep servings limited to  cup. Fruits All fruit juices except prune juice. Cooked or canned fruits without skin and seeds, such as applesauce, apricots, cherries, fruit cocktail, grapefruit, grapes, mandarin oranges, melons, peaches, pears, pineapple, and plums. Fresh fruits without skin, such as apricots, avocados, bananas, melons, pineapple, nectarines, and peaches. Keep servings limited to  cup or 1 piece. Meat and Other Protein Sources Ground or well-cooked tender beef, ham, veal, lamb, pork, or poultry. Eggs, plain cheese. Fish, oysters, shrimp, lobster, and other seafood. Liver, organ meats. Smooth nut butters. Dairy All milk products and alternative dairy substitutes, such as soy, rice, almond, and coconut, not containing added whole nuts, seeds, or added fruit. Beverages Decaf coffee, fruit, and vegetable juices or smoothies (small amounts, with no pulp or skins, and with fruits from allowed list), sports drinks, herbal tea. Condiments Ketchup, mustard, vinegar, cream sauce, cheese sauce, cocoa powder. Spices in moderation, such as allspice, basil, bay leaves, celery powder or leaves, cinnamon, cumin powder, curry powder, ginger, mace, marjoram, onion or garlic powder, oregano, paprika, parsley flakes, ground pepper, rosemary, sage, savory, tarragon, thyme, and turmeric. Sweets and Desserts Plain cakes and cookies, pie made with allowed fruit, pudding, custard, cream pie. Gelatin, fruit, ice, sherbet, frozen ice pops. Ice cream, ice milk without nuts. Plain hard candy, honey, jelly, molasses, syrup, sugar, chocolate syrup, gumdrops, marshmallows. Limit overall sugar intake. Fats and Oil Margarine, butter, cream, mayonnaise, salad oils, plain  salad dressings made from allowed foods. Choose healthy fats such as olive oil, canola oil, and omega-3 fatty acids (such as found in salmon or tuna) when possible. Other Bouillon, broth, or cream soups made from allowed foods. Any strained  soup. Casseroles or mixed dishes made with allowed foods. The items listed above may not be a complete list of recommended foods or beverages. Contact your dietitian for more options. What foods are not recommended? Grains All whole wheat and whole grain breads and crackers. Multigrains, rye, bran seeds, nuts, or coconut. Cereals containing whole grains, multigrains, bran, coconut, nuts, raisins. Cooked or dry oatmeal, steel-cut oats. Coarse wheat cereals, granola. Cereals advertised as high fiber. Potato skins. Whole grain pasta, wild or brown rice. Popcorn. Coconut flour. Bran, buckwheat, corn bread, multigrains, rye, wheat germ. Vegetables Fresh, cooked or canned vegetables, such as artichokes, asparagus, beet greens, broccoli, Brussels sprouts, cabbage, celery, cauliflower, corn, eggplant, kale, legumes or beans, okra, peas, and tomatoes. Avoid large servings of any vegetables, especially raw vegetables. Fruits Fresh fruits, such as apples with or without skin, berries, cherries, figs, grapes, grapefruit, guavas, kiwis, mangoes, oranges, papayas, pears, persimmons, pineapple, and pomegranate. Prune juice and juices with pulp, stewed or dried prunes. Dried fruits, dates, raisins. Fruit seeds or skins. Avoid large servings of all fresh fruits. Meats and Other Protein Sources Tough, fibrous meats with gristle. Chunky nut butter. Cheese made with seeds, nuts, or other foods not recommended. Nuts, seeds, legumes (beans, including baked beans), dried peas, beans, lentils. Dairy Yogurt or cheese that contains nuts, seeds, or added fruit. Beverages Fruit juices with high pulp, prune juice. Caffeinated coffee and teas. Condiments Coconut, maple syrup, pickles, olives. Sweets and Desserts Desserts, cookies, or candies that contain nuts or coconut, chunky peanut butter, dried fruits. Jams, preserves with seeds, marmalade. Large amounts of sugar and sweets. Any other dessert made with fruits from the not  recommended list. Other Soups made from vegetables that are not recommended or that contain other foods not recommended. The items listed above may not be a complete list of foods and beverages to avoid. Contact your dietitian for more information. This information is not intended to replace advice given to you by your health care provider. Make sure you discuss any questions you have with your health care provider. Document Released: 04/16/2002 Document Revised: 04/01/2016 Document Reviewed: 09/17/2013 Elsevier Interactive Patient Education  2017 [employer]

## 2018-02-21 ENCOUNTER — Ambulatory Visit (INDEPENDENT_AMBULATORY_CARE_PROVIDER_SITE_OTHER): Payer: Medicare (Managed Care) | Admitting: Family Medicine

## 2018-02-21 ENCOUNTER — Encounter: Payer: Self-pay | Admitting: Family Medicine

## 2018-02-21 ENCOUNTER — Other Ambulatory Visit: Payer: Self-pay

## 2018-02-21 VITALS — BP 122/68 | HR 67 | Temp 99.0°F | Ht 69.0 in | Wt 177.6 lb

## 2018-02-21 DIAGNOSIS — Z8719 Personal history of other diseases of the digestive system: Secondary | ICD-10-CM | POA: Diagnosis not present

## 2018-02-21 DIAGNOSIS — R7303 Prediabetes: Secondary | ICD-10-CM

## 2018-02-21 DIAGNOSIS — K4030 Unilateral inguinal hernia, with obstruction, without gangrene, not specified as recurrent: Secondary | ICD-10-CM

## 2018-02-21 DIAGNOSIS — Z9889 Other specified postprocedural states: Secondary | ICD-10-CM | POA: Diagnosis not present

## 2018-02-21 DIAGNOSIS — F329 Major depressive disorder, single episode, unspecified: Secondary | ICD-10-CM | POA: Diagnosis not present

## 2018-02-21 DIAGNOSIS — F4310 Post-traumatic stress disorder, unspecified: Secondary | ICD-10-CM | POA: Diagnosis not present

## 2018-02-21 HISTORY — DX: Prediabetes: R73.03

## 2018-02-21 NOTE — Patient Instructions (Signed)
Please return in 3 months for your annual complete physical; please come fasting.   If you have any questions or concerns, please don't hesitate to send me a message via MyChart or call the office at [phone]. Thank you for visiting with us today! It's our pleasure caring for you.   

## 2018-02-21 NOTE — Progress Notes (Signed)
Subjective    CC:  Chief Complaint  Patient presents with  . Hospitalization Follow-up    Patient had a bowel resection, states she is doing better    HPI: Corey Collins is a 57 y.o. adult who presents to the office today to address the problems listed above in the chief complaint.  S/p right incarcerated inguinal hernia repair and small bowel resection 3/30; d/c'd from hospital 4/3. i've reviewed hospital records and discharge summary. His recovery was uneventful. He is feeling well with minimal discomfort. Bowels are functioning normally. No fevers. No calf pain. Had staples removed 4 days ago.   Prediabetes on metformin. Had elevated cbg's in hospital.   Mood - on lithium. [place] Asking for ibuprofen refill.    I reviewed the patients updated PMH, FH, and SocHx.    Patient Active Problem List   Diagnosis Date Noted  . Allergic asthma 12/15/2016    Priority: High  . Cigarette nicotine dependence without complication 12/15/2016    Priority: High  . Major depression, chronic 12/15/2016    Priority: High  . 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 PTSD (post-traumatic stress disorder) 12/15/2016    Priority: High  . Prediabetes 02/21/2018  . Incarcerated umbilical hernia 02/06/2018  . Incarcerated inguinal hernia 02/04/2018  . Anxiety 10/11/2017  . Overweight (BMI 25.0-29.9) 05/25/2017  . S/P right THA, AA 05/24/2017  . S/P left THA, AA 02/01/2017   Current Meds  Medication Sig  . acetaminophen (TYLENOL) 500 MG tablet Take 2 tablets (1,000 mg total) by mouth every 8 (eight) hours as needed.  . Cholecalciferol (VITAMIN D3 GUMMIES ADULT PO) Take 2 each by mouth daily.  . citalopram (CELEXA) 40 MG tablet Take 40 mg by mouth daily.  . Coenzyme Q10 (COQ-10) 50 MG CAPS Take 50 mg by mouth daily.   Marland Kitchen. lithium carbonate 300 MG capsule Take 300 mg by mouth 3 (three) times daily.  . metFORMIN (GLUCOPHAGE) 500 MG tablet Take  500 mg by mouth 2 (two) times daily with a meal.  . methylphenidate (RITALIN) 10 MG tablet Take 20 mg by mouth daily.  . Omega-3 Fatty Acids (FISH OIL) 1000 MG CAPS Take 1,000 mg by mouth daily.   . polyethylene glycol powder (GLYCOLAX/MIRALAX) powder Take 17 g by mouth 2 (two) times daily as needed.  . Prenatal Vit-Min-FA-Fish Oil (CVS PRENATAL GUMMY PO) Take 2 tablets by mouth daily.  Marland Kitchen. PROAIR RESPICLICK 108 (90 Base) MCG/ACT AEPB INHALE ONE PUFF BY MOUTH EVERY 4 HOURS  . [DISCONTINUED] EPINEPHrine (PRIMATENE MIST) 0.125 MG/ACT AERO Inhale 1 spray into the lungs daily as needed (shortness or breath, wheezing).  . [DISCONTINUED] ibuprofen (ADVIL,MOTRIN) 600 MG tablet Take 1 tablet (600 mg total) by mouth every 6 (six) hours as needed for mild pain.  . [DISCONTINUED] oxyCODONE (OXY IR/ROXICODONE) 5 MG immediate release tablet Take 1 tablet (5 mg total) by mouth every 6 (six) hours as needed for moderate pain.  . [DISCONTINUED] prazosin (MINIPRESS) 1 MG capsule Take 1 mg by mouth 2 (two) times daily as needed.     Allergies: Patient is allergic to sulfa antibiotics. Family History: Patient family history includes Coronary artery disease in her father; Diabetes in her other; Hypertension in her other. Social History:  Patient  reports that she has been smoking cigarettes.  She has a 3.50 pack-year smoking history. She has never used smokeless tobacco. She reports that she has current or past drug history.  Drug: Marijuana. She reports that she does not drink alcohol.  Review of Systems: Constitutional: Negative for fever malaise or anorexia Cardiovascular: negative for chest pain Respiratory: negative for SOB or persistent cough Gastrointestinal: negative for abdominal pain  Objective  Vitals: BP 122/68   Pulse 67   Temp 99 F (37.2 C)   Ht 5\' 9"  (1.753 m)   Wt 177 lb 9.6 oz (80.6 kg)   BMI 26.23 kg/m  General: no acute distress , A&Ox3, smells of smoke HEENT: PEERL, conjunctiva  normal, Oropharynx moist,neck is supple Cardiovascular:  RRR without murmur or gallop.  Respiratory:  Good breath sounds bilaterally, CTAB with normal respiratory effort ABD: soft, dry clean incisions with steri-strips applied, minimal ttp. No masses. Skin:  Warm, no rashes Lab Results  Component Value Date   WBC 12.8 (H) 02/06/2018   HGB 10.9 (L) 02/06/2018   HCT 32.7 (L) 02/06/2018   MCV 91.6 02/06/2018   PLT 229 02/06/2018    Assessment  1. S/P inguinal hernia repair   2. Incarcerated inguinal hernia   3. Prediabetes   4. PTSD (post-traumatic stress disorder)   5. Major depression, chronic      Plan   S/p inguinal repair and SB resection:  Fortunately, she was able to get to ER and had repair. Doing well. F/u with general surgery as scheduled. Mild post op anemia. Will recheck in 3 months.   Pre diabetes: continue metformin and recheck in 3 months. Discussed diet.   Mood disorder: per psych. Advised to avoid nsaids while on lithium.   Follow up: Return in about 3 months (around 05/23/2018) for complete physical.    Commons side effects, risks, benefits, and alternatives for medications and treatment plan prescribed today were discussed, and the patient expressed understanding of the given instructions. Patient is instructed to call or message via MyChart if he/she has any questions or concerns regarding our treatment plan. No barriers to understanding were identified. We discussed Red Flag symptoms and signs in detail. Patient expressed understanding regarding what to do in case of urgent or emergency type symptoms.   Medication list was reconciled, printed and provided to the patient in AVS. Patient instructions and summary information was reviewed with the patient as documented in the AVS. This note was prepared with assistance of Dragon voice recognition software. Occasional wrong-word or sound-a-like substitutions may have occurred due to the inherent limitations of voice  recognition software  No orders of the defined types were placed in this encounter.  No orders of the defined types were placed in this encounter.

## 2018-05-22 ENCOUNTER — Encounter: Payer: Medicare (Managed Care) | Admitting: Family Medicine

## 2018-05-23 ENCOUNTER — Encounter: Payer: Medicare (Managed Care) | Admitting: Family Medicine

## 2018-05-26 ENCOUNTER — Telehealth: Payer: Self-pay | Admitting: Emergency Medicine

## 2018-05-26 MED ORDER — METFORMIN HCL 500 MG PO TABS: 500.0000 mg | ORAL_TABLET | Freq: Two times a day (BID) | ORAL | 3 refills | Status: DC

## 2018-05-26 NOTE — Telephone Encounter (Signed)
Copied from CRM [phone]#[id number]. Topic: Inquiry >> May 26, 2018  4:01 PM Yvonna Alanisobinson, Andra M wrote: Reason for CRM: Patient called requesting a refill of MetFORMIN (GLUCOPHAGE) 500 MG tablet. [facility] [employer] [address] - [place], [place] - [address] [phone] (Phone)  [phone] (Fax).       Thank You!!!

## 2018-05-31 ENCOUNTER — Other Ambulatory Visit: Payer: Self-pay

## 2018-05-31 ENCOUNTER — Encounter: Payer: Self-pay | Admitting: Family Medicine

## 2018-05-31 ENCOUNTER — Ambulatory Visit (INDEPENDENT_AMBULATORY_CARE_PROVIDER_SITE_OTHER): Payer: Medicare (Managed Care) | Admitting: Family Medicine

## 2018-05-31 VITALS — BP 120/78 | HR 69 | Ht 69.0 in | Wt 182.0 lb

## 2018-05-31 DIAGNOSIS — L600 Ingrowing nail: Secondary | ICD-10-CM

## 2018-05-31 DIAGNOSIS — Z0000 Encounter for general adult medical examination without abnormal findings: Secondary | ICD-10-CM | POA: Diagnosis not present

## 2018-05-31 DIAGNOSIS — R7303 Prediabetes: Secondary | ICD-10-CM | POA: Diagnosis not present

## 2018-05-31 DIAGNOSIS — F17210 Nicotine dependence, cigarettes, uncomplicated: Secondary | ICD-10-CM

## 2018-05-31 DIAGNOSIS — Z1211 Encounter for screening for malignant neoplasm of colon: Secondary | ICD-10-CM | POA: Diagnosis not present

## 2018-05-31 DIAGNOSIS — M2042 Other hammer toe(s) (acquired), left foot: Secondary | ICD-10-CM

## 2018-05-31 DIAGNOSIS — Z1212 Encounter for screening for malignant neoplasm of rectum: Secondary | ICD-10-CM

## 2018-05-31 LAB — CBC WITH DIFFERENTIAL/PLATELET
BASOS PCT: 0.7 % (ref 0.0–3.0)
Basophils Absolute: 0.1 10*3/uL (ref 0.0–0.1)
EOS PCT: 6.5 % — AB (ref 0.0–5.0)
Eosinophils Absolute: 0.6 10*3/uL (ref 0.0–0.7)
HCT: 44.6 % (ref 39.0–52.0)
Hemoglobin: 15 g/dL (ref 13.0–17.0)
LYMPHS ABS: 1.7 10*3/uL (ref 0.7–4.0)
Lymphocytes Relative: 19.1 % (ref 12.0–46.0)
MCHC: 33.7 g/dL (ref 30.0–36.0)
MCV: 91.1 fl (ref 78.0–100.0)
MONO ABS: 0.7 10*3/uL (ref 0.1–1.0)
MONOS PCT: 8.5 % (ref 3.0–12.0)
NEUTROS ABS: 5.7 10*3/uL (ref 1.4–7.7)
NEUTROS PCT: 65.2 % (ref 43.0–77.0)
PLATELETS: 322 10*3/uL (ref 150.0–400.0)
RBC: 4.89 Mil/uL (ref 4.22–5.81)
RDW: 14.2 % (ref 11.5–15.5)
WBC: 8.8 10*3/uL (ref 4.0–10.5)

## 2018-05-31 LAB — COMPREHENSIVE METABOLIC PANEL
ALK PHOS: 59 U/L (ref 39–117)
ALT: 18 U/L (ref 0–53)
AST: 14 U/L (ref 0–37)
Albumin: 4.9 g/dL (ref 3.5–5.2)
BUN: 15 mg/dL (ref 6–23)
CHLORIDE: 102 meq/L (ref 96–112)
CO2: 26 meq/L (ref 19–32)
Calcium: 10 mg/dL (ref 8.4–10.5)
Creatinine, Ser: 0.77 mg/dL (ref 0.40–1.50)
GFR: 111 mL/min (ref >60.00)
GLUCOSE: 113 mg/dL — AB (ref 70–99)
POTASSIUM: 4.2 meq/L (ref 3.5–5.1)
SODIUM: 137 meq/L (ref 135–145)
TOTAL PROTEIN: 7.5 g/dL (ref 6.0–8.3)
Total Bilirubin: 0.3 mg/dL (ref 0.2–1.2)

## 2018-05-31 LAB — LIPID PANEL
Cholesterol: 153 mg/dL (ref 0–200)
HDL: 37.2 mg/dL — ABNORMAL LOW (ref >39.00)
LDL CALC: 93 mg/dL (ref 0–99)
NONHDL: 115.76
Total CHOL/HDL Ratio: 4
Triglycerides: 113 mg/dL (ref 0.0–149.0)
VLDL: 22.6 mg/dL (ref 0.0–40.0)

## 2018-05-31 LAB — TSH: TSH: 2.78 u[IU]/mL (ref 0.35–4.50)

## 2018-05-31 LAB — POCT GLYCOSYLATED HEMOGLOBIN (HGB A1C): Hemoglobin A1C: 6.4 % — AB (ref 4.0–5.6)

## 2018-05-31 MED ORDER — ALBUTEROL SULFATE HFA 108 (90 BASE) MCG/ACT IN AERS
2.0000 | INHALATION_SPRAY | RESPIRATORY_TRACT | 2 refills | Status: DC | PRN
Start: 2018-05-31 — End: 2019-06-27

## 2018-05-31 NOTE — Progress Notes (Signed)
Subjective  Chief Complaint  Patient presents with  . Annual Exam    HM up to date, doing well     HPI: Corey Collins is a 57 y.o. adult who presents to [facility] today for a Male Wellness Visit. (MTF transgender)  Wellness Visit: annual visit with health maintenance review and exam    Doing well overall. Smoker - 2-7 cigs per day; lives with smokers so this is a barrier to quitting although he would like to. Not too dependent upon nicotine. Has quit in past. Likely with mild copd. Rare albuterol use. No chronic sob or sputum production.   Prediabetes on metformin; no sxs of hyperglycemia. Tolerating meds.   Has painful callus on left toe and ingrown toenails.   Psych manages psych meds: reports stable.   Would like to go to transgender clinic for hormone treatments.   Assessment  1. Annual physical exam   2. Cigarette nicotine dependence without complication   3. Prediabetes   4. Screening for colorectal cancer   5. Ingrown toenail   6. Hammer toe of left foot      Plan  Male Wellness Visit:  Age appropriate Health Maintenance and Prevention measures were discussed with patient. Included topics are cancer screening recommendations, ways to keep healthy (see AVS) including dietary and exercise recommendations, regular eye and dental care, use of seat belts, and avoidance of moderate alcohol use and tobacco use. rec quitting; discussed use of nicotine patches  BMI: discussed patient's BMI and encouraged positive lifestyle modifications to help get to or maintain a target BMI.  HM needs and immunizations were addressed and ordered. See below for orders. See HM and immunization section for updates.  Routine labs and screening tests ordered including cmp, cbc and lipids where appropriate.  Discussed recommendations regarding Vit D and calcium supplementation (see AVS)  Refer to podiatry  cologuard for crc screen; can't have  colonoscopy due to recent bowel surgery.  Follow up: No follow-ups on file.   Orders Placed This Encounter  Procedures  . CBC with Differential/Platelet  . Comprehensive metabolic panel  . Lipid panel  . Cologuard  . TSH  . Ambulatory referral to Podiatry  . POCT glycosylated hemoglobin (Hb A1C)   Meds ordered this encounter  Medications  . albuterol (PROVENTIL HFA;VENTOLIN HFA) 108 (90 Base) MCG/ACT inhaler    Sig: Inhale 2 puffs into the lungs every 4 (four) hours as needed for wheezing or shortness of breath.    Dispense:  1 Inhaler    Refill:  2     Lifestyle: Body mass index is 26.88 kg/m. Wt Readings from Last 3 Encounters:  05/31/18 182 lb (82.6 kg)  02/21/18 177 lb 9.6 oz (80.6 kg)  02/05/18 187 lb 6.3 oz (85 kg)   Diet: general Exercise: rarely,   Patient Active Problem List   Diagnosis Date Noted  . Allergic asthma 12/15/2016    Priority: High  . Cigarette nicotine dependence without complication 12/15/2016    Priority: High  . Major depression, chronic 12/15/2016    Priority: High  . Male-to-male transgender person 12/15/2016    Priority: High    Overview:  2010 transitioned. No hormone use now.    Marland Kitchen PTSD (post-traumatic stress disorder) 12/15/2016    Priority: High  . Prediabetes 02/21/2018    On metformin   . Incarcerated umbilical hernia 02/06/2018  . Incarcerated inguinal hernia 02/04/2018  . Anxiety 10/11/2017  . Overweight (BMI 25.0-29.9) 05/25/2017  .  S/P right THA, AA 05/24/2017  . S/P left THA, AA 02/01/2017   Health Maintenance  Topic Date Due  . Fecal DNA (Cologuard)  03/26/2012  . INFLUENZA VACCINE  06/08/2018  . TETANUS/TDAP  07/11/2022  . Hepatitis C Screening  Completed  . HIV Screening  Completed   Immunization History  Administered Date(s) Administered  . Influenza,inj,Quad PF,6+ Mos 10/13/2017  . Pneumococcal Polysaccharide-23 05/12/2017  . Rabies, IM 07/11/2012  . Rabies, intradermal 07/11/2012  . Tdap 07/11/2012     We updated and reviewed the patient's past history in detail and it is documented below. Allergies: Patient is allergic to sulfa antibiotics. Past Medical History Patient  has a past medical history of Arthritis, Asthma, Borderline diabetes, DVT (deep venous thrombosis) (HCC) (2009), Headache, History of kidney stones, Pneumonia (age 42 or 32), Prediabetes (02/21/2018), Primary osteoarthritis of right hip (12/15/2016), PTSD (post-traumatic stress disorder), Puncture wound, and Seasonal allergies. 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 Family History: Patient family history includes Coronary artery disease in her father; Diabetes in her other; Hypertension in her other. Social History:  Patient  reports that she has been smoking cigarettes.  She has smoked for the past 7.00 years. She has never used smokeless tobacco. She reports that she has current or past drug history. Drug: Marijuana. She reports that she does not drink alcohol.  Review of Systems: Constitutional: negative for fever or malaise Ophthalmic: negative for photophobia, double vision or loss of vision Cardiovascular: negative for chest pain, dyspnea on exertion, or new LE swelling Respiratory: negative for SOB or persistent cough Gastrointestinal: negative for abdominal pain, change in bowel habits or melena Genitourinary: negative for dysuria or gross hematuria, no abnormal uterine bleeding or disharge Musculoskeletal: negative for new gait disturbance or muscular weakness Integumentary: negative for new or persistent rashes, no breast lumps Neurological: negative for TIA or stroke symptoms Psychiatric: negative for SI or delusions Allergic/Immunologic:  negative for hives  Patient Care Team    Relationship Specialty Notifications Start End  [name], MD PCP - General Family Medicine  01/26/17     Objective  Vitals: BP 120/78   Pulse 69   Ht 5\' 9"  (1.753 m)   Wt 182 lb (82.6 kg)   SpO2 96%   BMI 26.88 kg/m  General:  Well developed, well nourished, no acute distress  Psych:  Alert and orientedx3,normal mood and affect HEENT:  Normocephalic, atraumatic, non-icteric sclera, PERRL, oropharynx is clear without mass or exudate, supple neck without adenopathy, mass or thyromegaly Cardiovascular:  Normal S1, S2, RRR without gallop, rub or murmur, nondisplaced PMI Respiratory:  Good breath sounds bilaterally, CTAB with normal respiratory effort Gastrointestinal: normal bowel sounds, soft, non-tender, no noted masses. No HSM MSK: no deformities, contusions. Joints are without erythema or swelling. Spine and CVA region are nontender, left hammer toe with callus Skin:  Warm, no rashes or suspicious lesions noted Neurologic:    Mental status is normal. CN 2-11 are normal. Gross motor and sensory exams are normal. Normal gait. No tremor  Commons side effects, risks, benefits, and alternatives for medications and treatment plan prescribed today were discussed, and the patient expressed understanding of the given instructions. Patient is instructed to call or message via MyChart if he/she has any questions or concerns regarding our treatment plan. No barriers  to understanding were identified. We discussed Red Flag symptoms and signs in detail. Patient expressed understanding regarding what to do in case of urgent or emergency type symptoms.   Medication list was reconciled, printed and provided to the patient in AVS. Patient instructions and summary information was reviewed with the patient as documented in the AVS. This note was prepared with assistance of Dragon voice recognition software. Occasional wrong-word or sound-a-like substitutions may  have occurred due to the inherent limitations of voice recognition software

## 2018-05-31 NOTE — Patient Instructions (Addendum)
Please return in 6 months to recheck sugars and breathing.  If you have any questions or concerns, please don't hesitate to send me a message via MyChart or call the office at [phone]. Thank you for visiting with [place] today! It's our pleasure caring for you.   Coping with Quitting Smoking Quitting smoking is a physical and mental challenge. You will face cravings, withdrawal symptoms, and temptation. Before quitting, work with your health care provider to make a plan that can help you cope. Preparation can help you quit and keep you from giving in. How can I cope with cravings? Cravings usually last for 5-10 minutes. If you get through it, the craving will pass. Consider taking the following actions to help you cope with cravings:  Keep your mouth busy: ? Chew sugar-free gum. ? Suck on hard candies or a straw. ? Brush your teeth.  Keep your hands and body busy: ? Immediately change to a different activity when you feel a craving. ? Squeeze or play with a ball. ? Do an activity or a hobby, like making bead jewelry, practicing needlepoint, or working with wood. ? Mix up your normal routine. ? Take a short exercise break. Go for a quick walk or run up and down stairs. ? Spend time in public places where smoking is not allowed.  Focus on doing something kind or helpful for someone else.  Call a friend or family member to talk during a craving.  Join a support group.  Call a quit line, such as 1-800-QUIT-NOW.  Talk with your health care provider about medicines that might help you cope with cravings and make quitting easier for you.  How can I deal with withdrawal symptoms? Your body may experience negative effects as it tries to get used to not having nicotine in the system. These effects are called withdrawal symptoms. They may include:  Feeling hungrier than normal.  Trouble concentrating.  Irritability.  Trouble sleeping.  Feeling depressed.  Restlessness and  agitation.  Craving a cigarette.  To manage withdrawal symptoms:  Avoid places, people, and activities that trigger your cravings.  Remember why you want to quit.  Get plenty of sleep.  Avoid coffee and other caffeinated drinks. These may worsen some of your symptoms.  How can I handle social situations? Social situations can be difficult when you are quitting smoking, especially in the first few weeks. To manage this, you can:  Avoid parties, bars, and other social situations where people might be smoking.  Avoid alcohol.  Leave right away if you have the urge to smoke.  Explain to your family and friends that you are quitting smoking. Ask for understanding and support.  Plan activities with friends or family where smoking is not an option.  What are some ways I can cope with stress? Wanting to smoke may cause stress, and stress can make you want to smoke. Find ways to manage your stress. Relaxation techniques can help. For example:  Breathe slowly and deeply, in through your nose and out through your mouth.  Listen to soothing, relaxing music.  Talk with a family member or friend about your stress.  Light a candle.  Soak in a bath or take a shower.  Think about a peaceful place.  What are some ways I can prevent weight gain? Be aware that many people gain weight after they quit smoking. However, not everyone does. To keep from gaining weight, have a plan in place before you quit and stick to the  plan after you quit. Your plan should include:  Having healthy snacks. When you have a craving, it may help to: ? Eat plain popcorn, crunchy carrots, celery, or other cut vegetables. ? Chew sugar-free gum.  Changing how you eat: ? Eat small portion sizes at meals. ? Eat 4-6 small meals throughout the day instead of 1-2 large meals a day. ? Be mindful when you eat. Do not watch television or do other things that might distract you as you eat.  Exercising  regularly: ? Make time to exercise each day. If you do not have time for a long workout, do short bouts of exercise for 5-10 minutes several times a day. ? Do some form of strengthening exercise, like weight lifting, and some form of aerobic exercise, like running or swimming.  Drinking plenty of water or other low-calorie or no-calorie drinks. Drink 6-8 glasses of water daily, or as much as instructed by your health care provider.  Summary  Quitting smoking is a physical and mental challenge. You will face cravings, withdrawal symptoms, and temptation to smoke again. Preparation can help you as you go through these challenges.  You can cope with cravings by keeping your mouth busy (such as by chewing gum), keeping your body and hands busy, and making calls to family, friends, or a helpline for people who want to quit smoking.  You can cope with withdrawal symptoms by avoiding places where people smoke, avoiding drinks with caffeine, and getting plenty of rest.  Ask your health care provider about the different ways to prevent weight gain, avoid stress, and handle social situations. This information is not intended to replace advice given to you by your health care provider. Make sure you discuss any questions you have with your health care provider. Document Released: 10/22/2016 Document Revised: 10/22/2016 Document Reviewed: 10/22/2016 Elsevier Interactive Patient Education  [employer]2018 Elsevier Inc.

## 2018-05-31 NOTE — Progress Notes (Signed)
Please call patient: I have reviewed his/her lab results. Labs look mostly ok however, sugars are worsening. Likely now a mild diabetic. Continue metformin and improve diet. Other test results are normal. F/u as directed

## 2018-06-14 ENCOUNTER — Telehealth: Payer: Self-pay | Admitting: Emergency Medicine

## 2018-06-14 ENCOUNTER — Telehealth: Payer: Self-pay | Admitting: Family Medicine

## 2018-06-14 NOTE — Telephone Encounter (Signed)
Copied from CRM [id number]. Topic: General - Other >> Jun 01, 2018 11:53 AM Gean BirchwoodWilliams-Neal, Sade R wrote: [facility] for a quality improvement measure and also stated she will be faxing something over for review  CB# [phone] reference # [id number]  >> Jun 14, 2018 12:26 PM Alexander BergeronBarksdale, Harvey B wrote: [facility] called to check into the paper work that was faxed over; they are concerned if any progress concerning having the pt start a statin; contact [facility] @ [phone] Ref# [id number]

## 2018-06-14 NOTE — Telephone Encounter (Signed)
Copied from CRM #[id number]. Topic: General - Other °>> Jun 01, 2018 11:53 AM Williams-Neal, Sade R wrote: °Toriano Aikey from [facility] from [facility] is calling regarding pt for a quality improvement measure and also stated she will be faxing something over for review ° °CB# [phone] reference # [id number] ° °>> Jun 14, 2018 12:26 PM Barksdale, Harvey B wrote: °[facility] called to check into the paper work that was faxed over; they are concerned if any progress concerning having the pt start a statin; contact [facility] @ [phone] °Ref# [id number] °

## 2018-06-15 NOTE — Telephone Encounter (Signed)
Noted  

## 2018-06-30 ENCOUNTER — Telehealth: Payer: Self-pay | Admitting: Family Medicine

## 2018-06-30 NOTE — Telephone Encounter (Signed)
Please Advise.   Jaquan Sadowsky,  LPN   

## 2018-06-30 NOTE — Telephone Encounter (Signed)
Copied from CRM [id number]. Topic: General - Other >> Jun 30, 2018 10:56 AM Tamela OddiHarris, Emmanuel Ercole J wrote: Reason for CRM: Heather with [facility]  called to request that a statin be added  to patient's orders because patient is diabetic regardless of Lipid levels.  Reference # [id number].  CB# [phone].

## 2018-07-03 ENCOUNTER — Telehealth: Payer: Self-pay | Admitting: Emergency Medicine

## 2018-07-03 NOTE — Telephone Encounter (Signed)
Copied from CRM [id number]. Topic: General - Call Back - No Documentation >> Jul 03, 2018  1:27 PM Baldo DaubAlexander, Amber L wrote: Reason for CRM:   Pt called and states that she received a call from this office with the message "to call the office back".  Pt wonders if it was in regards to lab work.  Pt can be reached at [phone]   [facility]

## 2018-07-03 NOTE — Progress Notes (Signed)
LM for Patient to call back

## 2018-07-03 NOTE — Telephone Encounter (Signed)
Patient states [place] and [place] Cologaurd Kit Is here, patient states that she will complete this week and mail back.   Doloris Hall,  LPN

## 2018-07-03 NOTE — Telephone Encounter (Signed)
Will address at follow up visits. I'm aware of guidelines.

## 2018-11-27 ENCOUNTER — Ambulatory Visit (INDEPENDENT_AMBULATORY_CARE_PROVIDER_SITE_OTHER): Payer: Medicare (Managed Care) | Admitting: Family Medicine

## 2018-11-27 ENCOUNTER — Encounter: Payer: Self-pay | Admitting: Family Medicine

## 2018-11-27 ENCOUNTER — Other Ambulatory Visit: Payer: Self-pay

## 2018-11-27 VITALS — BP 124/68 | HR 67 | Temp 98.0°F | Resp 18 | Ht 69.0 in | Wt 191.6 lb

## 2018-11-27 DIAGNOSIS — E119 Type 2 diabetes mellitus without complications: Secondary | ICD-10-CM | POA: Diagnosis not present

## 2018-11-27 DIAGNOSIS — M62549 Muscle wasting and atrophy, not elsewhere classified, unspecified hand: Secondary | ICD-10-CM

## 2018-11-27 DIAGNOSIS — F329 Major depressive disorder, single episode, unspecified: Secondary | ICD-10-CM

## 2018-11-27 DIAGNOSIS — R7303 Prediabetes: Secondary | ICD-10-CM

## 2018-11-27 DIAGNOSIS — F17210 Nicotine dependence, cigarettes, uncomplicated: Secondary | ICD-10-CM | POA: Diagnosis not present

## 2018-11-27 DIAGNOSIS — J4520 Mild intermittent asthma, uncomplicated: Secondary | ICD-10-CM

## 2018-11-27 LAB — MICROALBUMIN / CREATININE URINE RATIO
Creatinine,U: 156.3 mg/dL
Microalb Creat Ratio: 0.6 mg/g (ref 0.0–30.0)
Microalb, Ur: 1 mg/dL (ref 0.0–1.9)

## 2018-11-27 LAB — POCT GLYCOSYLATED HEMOGLOBIN (HGB A1C): Hemoglobin A1C: 6.7 % — AB (ref 4.0–5.6)

## 2018-11-27 MED ORDER — SIMVASTATIN 10 MG PO TABS: 10.0000 mg | ORAL_TABLET | Freq: Every day | ORAL | 3 refills | Status: DC

## 2018-11-27 NOTE — Patient Instructions (Addendum)
3 months for diabetes follow up  Please restart your metformin twice a day and start the simvastatin nightly.  Keep trying to quit smoking.   We will call you with information regarding your referral appointment. EMG/NCV (nerve and muscle testing) of your hands.  If you do not hear from [place] within the next 2 weeks, please let me know. It can take 1-2 weeks to get appointments set up with the specialists.   If you have any questions or concerns, please don't hesitate to send me a message via MyChart or call the office at [phone]. Thank you for visiting with [place] today! It's our pleasure caring for you.   Diabetes Mellitus and Nutrition, Adult When you have diabetes (diabetes mellitus), it is very important to have healthy eating habits because your blood sugar (glucose) levels are greatly affected by what you eat and drink. Eating healthy foods in the appropriate amounts, at about the same times every day, can help you:  Control your blood glucose.  Lower your risk of heart disease.  Improve your blood pressure.  Reach or maintain a healthy weight. Every person with diabetes is different, and each person has different needs for a meal plan. Your health care provider may recommend that you work with a diet and nutrition specialist (dietitian) to make a meal plan that is best for you. Your meal plan may vary depending on factors such as:  The calories you need.  The medicines you take.  Your weight.  Your blood glucose, blood pressure, and cholesterol levels.  Your activity level.  Other health conditions you have, such as heart or kidney disease. How do carbohydrates affect me? Carbohydrates, also called carbs, affect your blood glucose level more than any other type of food. Eating carbs naturally raises the amount of glucose in your blood. Carb counting is a method for keeping track of how many carbs you eat. Counting carbs is important to keep your blood glucose at a healthy  level, especially if you use insulin or take certain oral diabetes medicines. It is important to know how many carbs you can safely have in each meal. This is different for every person. Your dietitian can help you calculate how many carbs you should have at each meal and for each snack. Foods that contain carbs include:  Bread, cereal, rice, pasta, and crackers.  Potatoes and corn.  Peas, beans, and lentils.  Milk and yogurt.  Fruit and juice.  Desserts, such as cakes, cookies, ice cream, and candy. How does alcohol affect me? Alcohol can cause a sudden decrease in blood glucose (hypoglycemia), especially if you use insulin or take certain oral diabetes medicines. Hypoglycemia can be a life-threatening condition. Symptoms of hypoglycemia (sleepiness, dizziness, and confusion) are similar to symptoms of having too much alcohol. If your health care provider says that alcohol is safe for you, follow these guidelines:  Limit alcohol intake to no more than 1 drink per day for nonpregnant women and 2 drinks per day for men. One drink equals 12 oz of beer, 5 oz of wine, or 1 oz of hard liquor.  Do not drink on an empty stomach.  Keep yourself hydrated with water, diet soda, or unsweetened iced tea.  Keep in mind that regular soda, juice, and other mixers may contain a lot of sugar and must be counted as carbs. What are tips for following this plan?  Reading food labels  Start by checking the serving size on the "Nutrition Facts" label of  packaged foods and drinks. The amount of calories, carbs, fats, and other nutrients listed on the label is based on one serving of the item. Many items contain more than one serving per package.  Check the total grams (g) of carbs in one serving. You can calculate the number of servings of carbs in one serving by dividing the total carbs by 15. For example, if a food has 30 g of total carbs, it would be equal to 2 servings of carbs.  Check the number of  grams (g) of saturated and trans fats in one serving. Choose foods that have low or no amount of these fats.  Check the number of milligrams (mg) of salt (sodium) in one serving. Most people should limit total sodium intake to less than 2,300 mg per day.  Always check the nutrition information of foods labeled as "low-fat" or "nonfat". These foods may be higher in added sugar or refined carbs and should be avoided.  Talk to your dietitian to identify your daily goals for nutrients listed on the label. Shopping  Avoid buying canned, premade, or processed foods. These foods tend to be high in fat, sodium, and added sugar.  Shop around the outside edge of the grocery store. This includes fresh fruits and vegetables, bulk grains, fresh meats, and fresh dairy. Cooking  Use low-heat cooking methods, such as baking, instead of high-heat cooking methods like deep frying.  Cook using healthy oils, such as olive, canola, or sunflower oil.  Avoid cooking with butter, cream, or high-fat meats. Meal planning  Eat meals and snacks regularly, preferably at the same times every day. Avoid going long periods of time without eating.  Eat foods high in fiber, such as fresh fruits, vegetables, beans, and whole grains. Talk to your dietitian about how many servings of carbs you can eat at each meal.  Eat 4-6 ounces (oz) of lean protein each day, such as lean meat, chicken, fish, eggs, or tofu. One oz of lean protein is equal to: ? 1 oz of meat, chicken, or fish. ? 1 egg. ?  cup of tofu.  Eat some foods each day that contain healthy fats, such as avocado, nuts, seeds, and fish. Lifestyle  Check your blood glucose regularly.  Exercise regularly as told by your health care provider. This may include: ? 150 minutes of moderate-intensity or vigorous-intensity exercise each week. This could be brisk walking, biking, or water aerobics. ? Stretching and doing strength exercises, such as yoga or  weightlifting, at least 2 times a week.  Take medicines as told by your health care provider.  Do not use any products that contain nicotine or tobacco, such as cigarettes and e-cigarettes. If you need help quitting, ask your health care provider.  Work with a Social worker or diabetes educator to identify strategies to manage stress and any emotional and social challenges. Questions to ask a health care provider  Do I need to meet with a diabetes educator?  Do I need to meet with a dietitian?  What number can I call if I have questions?  When are the best times to check my blood glucose? Where to find more information:  American Diabetes Association: diabetes.org  Academy of Nutrition and Dietetics: www.eatright.[employer] of Diabetes and Digestive and Kidney Diseases (NIH): [employer] Summary  A healthy meal plan will help you control your blood glucose and maintain a healthy lifestyle.  Working with a diet and nutrition specialist (dietitian) can help you make a meal  plan that is best for you.  Keep in mind that carbohydrates (carbs) and alcohol have immediate effects on your blood glucose levels. It is important to count carbs and to use alcohol carefully. This information is not intended to replace advice given to you by your health care provider. Make sure you discuss any questions you have with your health care provider. Document Released: 07/22/2005 Document Revised: 05/25/2017 Document Reviewed: 11/29/2016 Elsevier Interactive Patient Education  2019 [employer]
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
## 2019-01-31 ENCOUNTER — Telehealth: Payer: Self-pay | Admitting: Family Medicine

## 2019-01-31 NOTE — Telephone Encounter (Signed)
Pt called in stating that she is having some ear pain and dizziness/balance issues. Pt states that she doesn't think that she can do an video appt. Pt can be reached at [phone]  She also needs refill on the metformin pt uses [facility], [facility] at [facility].

## 2019-02-01 ENCOUNTER — Encounter: Payer: Self-pay | Admitting: Family Medicine

## 2019-02-01 ENCOUNTER — Ambulatory Visit (INDEPENDENT_AMBULATORY_CARE_PROVIDER_SITE_OTHER): Payer: Medicare (Managed Care) | Admitting: Family Medicine

## 2019-02-01 ENCOUNTER — Other Ambulatory Visit: Payer: Self-pay

## 2019-02-01 DIAGNOSIS — H9201 Otalgia, right ear: Secondary | ICD-10-CM | POA: Diagnosis not present

## 2019-02-01 DIAGNOSIS — K047 Periapical abscess without sinus: Secondary | ICD-10-CM

## 2019-02-01 MED ORDER — AMOXICILLIN 875 MG PO TABS: 875.0000 mg | ORAL_TABLET | Freq: Two times a day (BID) | ORAL | 0 refills | Status: AC

## 2019-02-01 NOTE — Telephone Encounter (Signed)
Pt is scheduled for a phone visit.

## 2019-02-01 NOTE — Telephone Encounter (Signed)
Pt is called for telephone visit

## 2019-02-01 NOTE — Progress Notes (Signed)
TELEPHONE ENCOUNTER   Patient verbally agreed to telephone visit and is aware that copayment and coinsurance may apply. Patient was treated using telemedicine according to accepted telemedicine protocols.  Pt was unable to do a video visit; he is High risk due to copd, diabetes and smoking so we elected a telephone visit for his safety during this COVID-19 stay at home order.   Location of the patient: home Location of provider: [facility], [place] office Names of all persons participating in the telemedicine service and role in the encounter: [name], MD Rita Ohara, CMA    Subjective  CC:  Chief Complaint  Patient presents with  . Ear Pain    Started about 01/19/19 and has been on and off on the right side.. He has tried listerine and peroxide.. Thinks it may be more related to infected tooth.. He denies fever.. Denies facial swelling, neck pain, and headaches    HPI: Corey Collins is a 58 y.o. adult who presents to the office today to address the problems listed above in the chief complaint.  C/o intermittent right ear pain, started on march 13-14, then improved and then started again 2-3 days ago. Also with dental pain on that side  Assessment  1. Dental infection   2. Right ear pain      Plan   Possible dental infection with referred pain vs AR and ETD:  Will treat with amox and antihistamine. No red flag sxs. To call back if worsening.  Time spent with the patient: 15 minutes, spent in obtaining information about her symptoms, reviewing her previous labs, evaluations, and treatments, counseling her about her condition (please see the discussed topics above), and developing a plan to further investigate it; she had a number of questions which I addressed.  Follow up: Return if symptoms worsen or fail to improve, for as scheduled for diabetes and lipid recheck.  02/26/2019  No orders of the defined types were placed in this encounter.  Meds  ordered this encounter  Medications  . amoxicillin (AMOXIL) 875 MG tablet    Sig: Take 1 tablet (875 mg total) by mouth 2 (two) times daily for 7 days.    Dispense:  14 tablet    Refill:  0      I reviewed the patients updated PMH, FH, and SocHx.    Patient Active Problem List   Diagnosis Date Noted  . Prediabetes 02/21/2018    Priority: High  . GAD (generalized anxiety disorder) 10/11/2017    Priority: High  . Allergic asthma 12/15/2016    Priority: High  . Cigarette nicotine dependence without complication 12/15/2016    Priority: High  . Major depression, chronic 12/15/2016    Priority: High  . 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 PTSD (post-traumatic stress disorder) 12/15/2016    Priority: High  . 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 S/P left THA, AA 02/01/2017    Priority: Medium  . Overweight (BMI 25.0-29.9) 05/25/2017   Current Meds  Medication Sig  . acetaminophen (TYLENOL) 500 MG tablet Take 2 tablets (1,000 mg total) by mouth every 8 (eight) hours as needed.  Marland Kitchen albuterol (PROVENTIL HFA;VENTOLIN HFA) 108 (90 Base) MCG/ACT inhaler Inhale 2 puffs into the lungs every 4 (four) hours as needed for wheezing or shortness of breath.  . ALPRAZolam (XANAX) 0.5 MG tablet   . amitriptyline (ELAVIL) 50 MG tablet Take 50 mg by mouth  at bedtime as needed for sleep.  . Cholecalciferol (VITAMIN D3 GUMMIES ADULT PO) Take 2 each by mouth daily.  . Coenzyme Q10 (COQ-10) 50 MG CAPS Take 50 mg by mouth daily.   Marland Kitchen lithium carbonate 300 MG capsule Take 300 mg by mouth 3 (three) times daily.  . metFORMIN (GLUCOPHAGE) 500 MG tablet Take 1 tablet (500 mg total) by mouth 2 (two) times daily with a meal.  . methylphenidate (RITALIN) 10 MG tablet Take 20 mg by mouth daily.  . Omega-3 Fatty Acids (FISH OIL) 1000 MG CAPS Take 1,000 mg by mouth daily.   Marland Kitchen PARoxetine (PAXIL) 10 MG tablet   . polyethylene glycol powder (GLYCOLAX/MIRALAX) powder Take 17  g by mouth 2 (two) times daily as needed.  . prazosin (MINIPRESS) 1 MG capsule TAKE ONE CAPSULE BY MOUTH TWICE DAILY FOR 30 DAYS  . Prenatal Vit-Min-FA-Fish Oil ([employer] PRENATAL GUMMY PO) Take 2 tablets by mouth daily.  . simvastatin (ZOCOR) 10 MG tablet Take 1 tablet (10 mg total) by mouth at bedtime.    Allergies: Patient is allergic to sulfa antibiotics. Family History: Patient family history includes Coronary artery disease in her father; Diabetes in an other family member; Hypertension in an other family member. Social History:  Patient  reports that she has been smoking cigarettes. She has smoked for the past 7.00 years. She has never used smokeless tobacco. She reports current drug use. Drug: Marijuana. She reports that she does not drink alcohol.  Review of Systems: Constitutional: Negative for fever malaise or anorexia Cardiovascular: negative for chest pain Respiratory: negative for SOB or persistent cough Gastrointestinal: negative for abdominal pain    Commons side effects, risks, benefits, and alternatives for medications and treatment plan prescribed today were discussed, and the patient expressed understanding of the given instructions. Patient is instructed to call or message via MyChart if he/she has any questions or concerns regarding our treatment plan. No barriers to understanding were identified. We discussed Red Flag symptoms and signs in detail. Patient expressed understanding regarding what to do in case of urgent or emergency type symptoms.   Medication list was reconciled, printed and provided to the patient in AVS. Patient instructions and summary information was reviewed with the patient as documented in the AVS. This note was prepared with assistance of Dragon voice recognition software. Occasional wrong-word or sound-a-like substitutions may have occurred due to the inherent limitations of voice recognition software     [phone] physician/qualified health  professional telephone evaluation 5 to 10 minutes [phone] physician/qualified help functional [place] evaluation for 11 to 20 minutes [phone] physician/qualify he will professional telephone evaluation for 21 to 30 minutes

## 2019-02-26 ENCOUNTER — Other Ambulatory Visit: Payer: Self-pay | Admitting: Family Medicine

## 2019-02-26 ENCOUNTER — Ambulatory Visit (INDEPENDENT_AMBULATORY_CARE_PROVIDER_SITE_OTHER): Payer: Medicare (Managed Care) | Admitting: Family Medicine

## 2019-02-26 ENCOUNTER — Other Ambulatory Visit: Payer: Self-pay

## 2019-02-26 ENCOUNTER — Encounter: Payer: Self-pay | Admitting: Family Medicine

## 2019-02-26 DIAGNOSIS — M62549 Muscle wasting and atrophy, not elsewhere classified, unspecified hand: Secondary | ICD-10-CM | POA: Diagnosis not present

## 2019-02-26 DIAGNOSIS — H9201 Otalgia, right ear: Secondary | ICD-10-CM

## 2019-02-26 DIAGNOSIS — E119 Type 2 diabetes mellitus without complications: Secondary | ICD-10-CM

## 2019-02-26 DIAGNOSIS — R29898 Other symptoms and signs involving the musculoskeletal system: Secondary | ICD-10-CM

## 2019-02-26 NOTE — Patient Instructions (Addendum)
Please return in July for your annual complete physical; please come fasting.   If you have any questions or concerns, please don't hesitate to send me a message via MyChart or call the office at [phone]. Thank you for visiting with us today! It's our pleasure caring for you.   

## 2019-02-26 NOTE — Progress Notes (Signed)
Virtual Visit via telephone Note  Subjective  CC:  Chief Complaint  Patient presents with  . Diabetes  . Hypertension     I connected with Corey Collins on 02/26/19 at  1:00 PM EDT by a video enabled telemedicine application and verified that I am speaking with the correct person using two identifiers. Location patient: Home Location provider: [facility], Office Persons participating in the virtual visit: Corey Collins, Corey Ora, MD Corey Collins, CMA  I discussed the limitations of evaluation and management by telemedicine and the availability of in person appointments. The patient expressed understanding and agreed to proceed. HPI: Corey Collins is a 58 y.o. adult who was contacted today to address the problems listed above in the chief complaint. .  Diabetes follow up: Her diabetic control is reported as Improved. On metformin since January and tolerating well. Eating much better; stopped all refined sugars and decreased carbs. Weight may be down a few pounds but hasn't checked. No sxs of hyperglycemia. No AEs from metformin. Due for recheck a1c but postponing due to covid 19 restrictions. Due for eye exam; again deferred for now. No ace due to negative recent microalbuminuria and normotensive. On statin and tolerating well.  She denies exertional CP or SOB or symptomatic hypoglycemia. She denies foot sores or paresthesias.  Ear pain: see last note. Resolved with amoxicillin  HM: due cologuard.   F/u hand weakness. From January: "Patient reports weakness in bilateral grip strength.  She noticed muscle wasting in bilateral palms and lateral hands several months ago.  She denies symptoms of carpal tunnel syndrome.  No pain in either upper extremity or the neck". sys persist. Emg/ncv was ordered but could not be scheduled due to her phone being out of service. She has persistent sxs, now with pain when she tries to open doors or twist things.  Also now with right neck and shoulder pain. Denies nightime sxs.   This note was prepared with assistance of Conservation officer, historic buildings. Occasional wrong-word or sound-a-like substitutions may have occurred due to the inherent limitations of voice recognition software Immunization History  Administered Date(s) Administered  . Influenza,inj,Quad PF,6+ Mos 10/13/2017  . Pneumococcal Polysaccharide-23 05/12/2017  . Rabies, IM 07/11/2012  . Rabies, intradermal 07/11/2012  . Tdap 07/11/2012    Diabetes Related Lab Review: Lab Results  Component Value Date   HGBA1C 6.7 (A) 11/27/2018   HGBA1C 6.4 (A) 05/31/2018   HGBA1C 6.2 (H) 10/13/2017    Lab Results  Component Value Date   MICROALBUR 1.0 11/27/2018   Lab Results  Component Value Date   CREATININE 0.77 05/31/2018   BUN 15 05/31/2018   NA 137 05/31/2018   K 4.2 05/31/2018   CL 102 05/31/2018   CO2 26 05/31/2018   Lab Results  Component Value Date   CHOL 153 05/31/2018   Lab Results  Component Value Date   HDL 37.20 (L) 05/31/2018   Lab Results  Component Value Date   LDLCALC 93 05/31/2018   Lab Results  Component Value Date   TRIG 113.0 05/31/2018   Lab Results  Component Value Date   CHOLHDL 4 05/31/2018   No results found for: LDLDIRECT The 10-year ASCVD risk score Denman George DC Jr., et al., 2013) is: 18.4%   Values used to calculate the score:     Age: 7 years     Sex: Male     Is Non-Hispanic African American: No  Diabetic: Yes     Tobacco smoker: Yes     Systolic Blood Pressure: 124 mmHg     Is BP treated: No     HDL Cholesterol: 37.2 mg/dL     Total Cholesterol: 153 mg/dL  BP Readings from Last 3 Encounters:  11/27/18 124/68  05/31/18 120/78  02/21/18 122/68   Wt Readings from Last 3 Encounters:  11/27/18 191 lb 9.6 oz (86.9 kg)  05/31/18 182 lb (82.6 kg)  02/21/18 177 lb 9.6 oz (80.6 kg)    Health Maintenance  Topic Date Due  . FOOT EXAM  03/26/1972  . OPHTHALMOLOGY EXAM   03/26/1972  . Fecal DNA (Cologuard)  03/26/2012  . HEMOGLOBIN A1C  05/28/2019  . INFLUENZA VACCINE  06/09/2019  . TETANUS/TDAP  07/11/2022  . PNEUMOCOCCAL POLYSACCHARIDE VACCINE AGE 38-64 HIGH RISK  Completed  . Hepatitis C Screening  Completed  . HIV Screening  Completed    Assessment  1. Controlled type 2 diabetes mellitus without complication, without long-term current use of insulin (HCC)   2. Right ear pain   3. Hypothenar muscle atrophy, unspecified laterality   4. Atrophy of interosseous muscle of hand      Plan   DM:  Should be controlled given diet changes and meds. Will defer recheck A1c for three months. Continue same meds. Will get eyes examined once she can.  I discussed the assessment and treatment plan with the patient. The patient was provided an opportunity to ask questions and all were answered. The patient agreed with the plan and demonstrated an understanding of the instructions.   Ear pain resolved.   Hand pain and weakness with atropy on exam: try to schedule for emg/ncv now. Phone is working   Time spent with the patient (non face-to-face time during this virtual encounter): 23 minutes, spent in obtaining information about her symptoms, reviewing her previous labs, evaluations, and treatments, counseling her about her condition (please see the discussed topics above), and developing a plan to further investigate it; the patient was provided an opportunity to ask questions and all were answered. The patient agreed with the plan and demonstrated an understanding of the instructions.   The patient was advised to call back or seek an in-person evaluation if the symptoms worsen or if the condition fails to improve as anticipated.  Follow up: Return in about 3 months (around 05/28/2019) for follow up Diabetes, complete physical.  Visit date not found  No orders of the defined types were placed in this encounter.     I reviewed the patients updated PMH, FH, and  SocHx.    Patient Active Problem List   Diagnosis Date Noted  . Prediabetes 02/21/2018    Priority: High  . GAD (generalized anxiety disorder) 10/11/2017    Priority: High  . Allergic asthma 12/15/2016    Priority: High  . Cigarette nicotine dependence without complication 12/15/2016    Priority: High  . Major depression, chronic 12/15/2016    Priority: High  . 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 PTSD (post-traumatic stress disorder) 12/15/2016    Priority: High  . 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 S/P left THA, AA 02/01/2017    Priority: Medium  . Controlled type 2 diabetes mellitus without complication, without long-term current use of insulin (HCC) 02/26/2019  . Overweight (BMI 25.0-29.9) 05/25/2017   Current Meds  Medication Sig  . acetaminophen (TYLENOL) 500 MG tablet Take 2 tablets (  1,000 mg total) by mouth every 8 (eight) hours as needed.  Marland Kitchen albuterol (PROVENTIL HFA;VENTOLIN HFA) 108 (90 Base) MCG/ACT inhaler Inhale 2 puffs into the lungs every 4 (four) hours as needed for wheezing or shortness of breath.  . ALPRAZolam (XANAX) 0.5 MG tablet   . amitriptyline (ELAVIL) 50 MG tablet Take 50 mg by mouth at bedtime as needed for sleep.  . Cholecalciferol (VITAMIN D3 GUMMIES ADULT PO) Take 2 each by mouth daily.  . Coenzyme Q10 (COQ-10) 50 MG CAPS Take 50 mg by mouth daily.   Marland Kitchen lithium carbonate 300 MG capsule Take 300 mg by mouth 3 (three) times daily.  . methylphenidate (RITALIN) 10 MG tablet Take 20 mg by mouth daily.  . Omega-3 Fatty Acids (FISH OIL) 1000 MG CAPS Take 1,000 mg by mouth daily.   Marland Kitchen PARoxetine (PAXIL) 10 MG tablet   . polyethylene glycol powder (GLYCOLAX/MIRALAX) powder Take 17 g by mouth 2 (two) times daily as needed.  . Prenatal Vit-Min-FA-Fish Oil (CVS PRENATAL GUMMY PO) Take 2 tablets by mouth daily.  . simvastatin (ZOCOR) 10 MG tablet Take 1 tablet (10 mg total) by mouth at bedtime.    Allergies:  Patient is allergic to sulfa antibiotics. Family History: Patient family history includes Coronary artery disease in her father; Diabetes in an other family member; Hypertension in an other family member. Social History:  Patient  reports that she has been smoking cigarettes. She has smoked for the past 7.00 years. She has never used smokeless tobacco. She reports current drug use. Drug: Marijuana. She reports that she does not drink alcohol.  Review of Systems: Constitutional: Negative for fever malaise or anorexia Cardiovascular: negative for chest pain Respiratory: negative for SOB or persistent cough Gastrointestinal: negative for abdominal pain  OBJECTIVE Vitals: There were no vitals taken for this visit. General: no acute distress , A&Ox3 From note of January: + atrophy bilateral thenar and hypothenar with decreased grip.   No further exam done today due to telephone visit    Corey Ora, MD   2

## 2019-06-04 ENCOUNTER — Encounter: Payer: Medicare (Managed Care) | Admitting: Family Medicine

## 2019-06-04 ENCOUNTER — Ambulatory Visit: Payer: Self-pay | Admitting: *Deleted

## 2019-06-04 ENCOUNTER — Telehealth: Payer: Self-pay

## 2019-06-04 NOTE — Telephone Encounter (Signed)
See note

## 2019-06-04 NOTE — Telephone Encounter (Signed)
I was asked to triage this patient after being told she was feeling depressed and making statements such as "if I died tomorrow, no one would care".  I have made several attempts to contact the patient and have been unsuccessful thus far.

## 2019-06-04 NOTE — Telephone Encounter (Signed)
Patient is calling with concerns that he has had depression about society that he has not been showering or self grooming properly.Patient is not washing hair/face. Patient states he now has rash on skin/head.  Patient does have a counselor he talks to- but [employer] in out of state in [place]. Patient is concerned about cramps in feet and legs he thinks is occurring due to medication. He has discussed this with the doctor before. Informed patient that if DR Jonni Sanger did not Rx these medications- she may not change them. Patient states he is ok with his appointment on the 7/30 date- offered to see if it could be moved sooner- but he states he is fine with it- will send note for review.  Reason for Disposition . [1] Depression AND [2] worsening (e.g.,sleeping poorly, less able to do activities of daily living)  Answer Assessment - Initial Assessment Questions 1. CONCERN: "What happened that made you call today?"     Patient is concerned that he has let himself go due to stress of social issues 2. DEPRESSION SYMPTOM SCREENING: "How are you feeling overall?" (e.g., decreased energy, increased sleeping or difficulty sleeping, difficulty concentrating, feelings of sadness, guilt, hopelessness, or worthlessness)     Trouble sleeping with anxiety at times,social issues upset him 3. RISK OF HARM - SUICIDAL IDEATION:  "Do you ever have thoughts of hurting or killing yourself?"  (e.g., yes, no, no but preoccupation with thoughts about death)   - INTENT:  "Do you have thoughts of hurting or killing yourself right NOW?" (e.g., yes, no, N/A)   - PLAN: "Do you have a specific plan for how you would do this?" (e.g., gun, knife, overdose, no plan, N/A)     No- no plan 4. RISK OF HARM - HOMICIDAL IDEATION:  "Do you ever have thoughts of hurting or killing someone else?"  (e.g., yes, no, no but preoccupation with thoughts about death)   - INTENT:  "Do you have thoughts of hurting or killing someone right NOW?" (e.g.,  yes, no, N/A)   - PLAN: "Do you have a specific plan for how you would do this?" (e.g., gun, knife, no plan, N/A)      No- no plan 5. FUNCTIONAL IMPAIRMENT: "How have things been going for you overall in your life? Have you had any more difficulties than usual doing your normal daily activities?"  (e.g., better, same, worse; self-care, school, work, interactions)     Patient has not been taking care of self 6. SUPPORT: "Who is with you now?" "Who do you live with?" "Do you have family or friends nearby who you can talk to?"      Patient has a good support system 7. THERAPIST: "Do you have a counselor or therapist? Name?"     Yes- out of state 8. STRESSORS: "Has there been any new stress or recent changes in your life?"     Yes-social 9. DRUG ABUSE/ALCOHOL: "Do you drink alcohol or use any illegal drugs?"      no 10. OTHER: "Do you have any other health or medical symptoms right now?" (e.g., fever)       Patient is concerned- he has not been taking care of self 11. PREGNANCY: "Is there any chance you are pregnant?" "When was your last menstrual period?"       n/a  Protocols used: DEPRESSION-A-AH

## 2019-06-05 NOTE — Telephone Encounter (Signed)
Called pt, she reports she is feeling fine and will see [place] on 7/30.

## 2019-06-07 ENCOUNTER — Ambulatory Visit: Payer: Medicare (Managed Care) | Admitting: Family Medicine

## 2019-06-14 ENCOUNTER — Other Ambulatory Visit: Payer: Self-pay

## 2019-06-14 ENCOUNTER — Ambulatory Visit (INDEPENDENT_AMBULATORY_CARE_PROVIDER_SITE_OTHER): Payer: Medicare Other | Admitting: Family Medicine

## 2019-06-14 ENCOUNTER — Encounter: Payer: Self-pay | Admitting: Family Medicine

## 2019-06-14 VITALS — BP 124/70 | HR 72 | Temp 98.3°F | Resp 16 | Ht 69.0 in | Wt 176.8 lb

## 2019-06-14 DIAGNOSIS — L249 Irritant contact dermatitis, unspecified cause: Secondary | ICD-10-CM

## 2019-06-14 DIAGNOSIS — E119 Type 2 diabetes mellitus without complications: Secondary | ICD-10-CM | POA: Diagnosis not present

## 2019-06-14 DIAGNOSIS — L219 Seborrheic dermatitis, unspecified: Secondary | ICD-10-CM

## 2019-06-14 DIAGNOSIS — Z1211 Encounter for screening for malignant neoplasm of colon: Secondary | ICD-10-CM | POA: Diagnosis not present

## 2019-06-14 DIAGNOSIS — F329 Major depressive disorder, single episode, unspecified: Secondary | ICD-10-CM | POA: Diagnosis not present

## 2019-06-14 DIAGNOSIS — Z1212 Encounter for screening for malignant neoplasm of rectum: Secondary | ICD-10-CM

## 2019-06-14 LAB — POCT GLYCOSYLATED HEMOGLOBIN (HGB A1C): Hemoglobin A1C: 5.9 % — AB (ref 4.0–5.6)

## 2019-06-14 MED ORDER — KETOCONAZOLE 2 % EX SHAM: 1.0000 "application " | MEDICATED_SHAMPOO | CUTANEOUS | 1 refills | Status: DC

## 2019-06-14 MED ORDER — TRIAMCINOLONE ACETONIDE 0.1 % EX CREA: 1.0000 "application " | TOPICAL_CREAM | Freq: Two times a day (BID) | CUTANEOUS | 0 refills | Status: DC

## 2019-06-14 NOTE — Progress Notes (Signed)
Subjective  CC:  Chief Complaint  Patient presents with  . Depression  . Itchiness    Scalp, ear, face, and chest area, scalp spots seems different than other area    HPI: Corey Collins is a 58 y.o. adult who presents to the office today for follow up of diabetes and problems listed above in the chief complaint.   Diabetes follow up: Her diabetic control is reported as Unchanged.  She denies exertional CP or SOB or symptomatic hypoglycemia. She denies foot sores or paresthesias.   Rash on scalp line and face: red and flaking. Has had intermittently over years. Thinks rash on cheek is due to sleeping on same pillow as the cats.   Never sent in cologuard but has kit at home.   Wt Readings from Last 3 Encounters:  06/14/19 176 lb 12.8 oz (80.2 kg)  11/27/18 191 lb 9.6 oz (86.9 kg)  05/31/18 182 lb (82.6 kg)    BP Readings from Last 3 Encounters:  06/14/19 124/70  11/27/18 124/68  05/31/18 120/78    Assessment  1. Controlled type 2 diabetes mellitus without complication, without long-term current use of insulin ([place])   2. Major depression, chronic   3. Screening for colorectal cancer   4. Seborrheic dermatitis of scalp   5. Irritant dermatitis      Plan   Diabetes is currently well controlled. No changes  seb derm and irritant derm: tac cream and nizoral ordered.   Depression stable  cologuard for crc screen.  Follow up: No follow-ups on file.. Orders Placed This Encounter  Procedures  . Cologuard  . POCT glycosylated hemoglobin (Hb A1C)   Meds ordered this encounter  Medications  . triamcinolone cream (KENALOG) 0.1 %    Sig: Apply 1 application topically 2 (two) times daily. For 2 weeks, then as needed    Dispense:  45 g    Refill:  0  . ketoconazole (NIZORAL) 2 % shampoo    Sig: Apply 1 application topically 2 (two) times a week.    Dispense:  120 mL    Refill:  1      Immunization History  Administered Date(s) Administered  .  Influenza,inj,Quad PF,6+ Mos 10/13/2017  . Pneumococcal Polysaccharide-23 05/12/2017  . Rabies, IM 07/11/2012  . Rabies, intradermal 07/11/2012  . Tdap 07/11/2012    Diabetes Related Lab Review: Lab Results  Component Value Date   HGBA1C 5.9 (A) 06/14/2019   HGBA1C 6.7 (A) 11/27/2018   HGBA1C 6.4 (A) 05/31/2018    Lab Results  Component Value Date   MICROALBUR 1.0 11/27/2018   Lab Results  Component Value Date   CREATININE 0.77 05/31/2018   BUN 15 05/31/2018   NA 137 05/31/2018   K 4.2 05/31/2018   CL 102 05/31/2018   CO2 26 05/31/2018   Lab Results  Component Value Date   CHOL 153 05/31/2018   Lab Results  Component Value Date   HDL 37.20 (L) 05/31/2018   Lab Results  Component Value Date   LDLCALC 93 05/31/2018   Lab Results  Component Value Date   TRIG 113.0 05/31/2018   Lab Results  Component Value Date   CHOLHDL 4 05/31/2018   No results found for: LDLDIRECT The 10-year ASCVD risk score Mikey Bussing DC Jr., et al., 2013) is: 19.5%   Values used to calculate the score:     Age: 36 years     Sex: Male     Is Non-Hispanic African American: No  Diabetic: Yes     Tobacco smoker: Yes     Systolic Blood Pressure: 456 mmHg     Is BP treated: No     HDL Cholesterol: 37.2 mg/dL     Total Cholesterol: 153 mg/dL I have reviewed the PMH, Fam and Soc history. Patient Active Problem List   Diagnosis Date Noted  . Prediabetes 02/21/2018    Priority: High    On metformin   . GAD (generalized anxiety disorder) 10/11/2017    Priority: High    Managed by psychiatry   . Allergic asthma 12/15/2016    Priority: High  . Cigarette nicotine dependence without complication 25/63/8937    Priority: High  . Major depression, chronic 12/15/2016    Priority: High    Managed by psychiatry   . Male-to-male transgender person 12/15/2016    Priority: High    Overview:  2010 transitioned. No hormone use now.    Marland Kitchen PTSD (post-traumatic stress disorder) 12/15/2016     Priority: High  . 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 S/P left THA, AA 02/01/2017    Priority: Medium  . Controlled type 2 diabetes mellitus without complication, without long-term current use of insulin ([place]) 02/26/2019  . Overweight (BMI 25.0-29.9) 05/25/2017    Social History: Patient  reports that she has been smoking cigarettes. She has smoked for the past 7.00 years. She has never used smokeless tobacco. She reports current drug use. Drug: Marijuana. She reports that she does not drink alcohol.  Review of Systems: Ophthalmic: negative for eye pain, loss of vision or double vision Cardiovascular: negative for chest pain Respiratory: negative for SOB or persistent cough Gastrointestinal: negative for abdominal pain Genitourinary: negative for dysuria or gross hematuria MSK: negative for foot lesions Neurologic: negative for weakness or gait disturbance  Objective  Vitals: BP 124/70   Pulse 72   Temp 98.3 F (36.8 C) (Tympanic)   Resp 16   Ht '5\' 9"'  (1.753 m)   Wt 176 lb 12.8 oz (80.2 kg)   SpO2 96%   BMI 26.11 kg/m  General: well appearing, no acute distress  Psych:  Alert and oriented, normal mood and affect HEENT:  Normocephalic, atraumatic, moist mucous membranes, supple neck  Cardiovascular:  Nl S1 and S2, RRR without murmur, gallop or rub. no edema Respiratory:  Good breath sounds bilaterally, CTAB with normal effort, no rales Skin:  Warm, red macular rash on left cheek. Scalp line with flaking rash Neurologic:   Mental status is normal. normal gait Foot exam: no erythema, pallor, or cyanosis visible nl proprioception and sensation to monofilament testing bilaterally, +2 distal pulses bilaterally    Diabetic education: ongoing education regarding chronic disease management for diabetes was given today. We continue to reinforce the ABC's of diabetic management: A1c (<7 or 8 dependent upon patient), tight blood pressure control, and cholesterol  management with goal LDL < 100 minimally. We discuss diet strategies, exercise recommendations, medication options and possible side effects. At each visit, we review recommended immunizations and preventive care recommendations for diabetics and stress that good diabetic control can prevent other problems. See below for this patient's data.    Commons side effects, risks, benefits, and alternatives for medications and treatment plan prescribed today were discussed, and the patient expressed understanding of the given instructions. Patient is instructed to call or message via MyChart if he/she has any questions or concerns regarding our treatment plan. No barriers to understanding were identified. We discussed Red  Flag symptoms and signs in detail. Patient expressed understanding regarding what to do in case of urgent or emergency type symptoms.   Medication list was reconciled, printed and provided to the patient in AVS. Patient instructions and summary information was reviewed with the patient as documented in the AVS. This note was prepared with assistance of Dragon voice recognition software. Occasional wrong-word or sound-a-like substitutions may have occurred due to the inherent limitations of voice recognition software

## 2019-06-14 NOTE — Patient Instructions (Signed)
Please return in 4 weeks for cpe. Come fasting  If you have any questions or concerns, please don't hesitate to send me a message via MyChart or call the office at [phone]. Thank you for visiting with [place] today! It's our pleasure caring for you.  Please send in your cologuard test.   I recommend the Cologuard test for your colon cancer screening that is due. I have ordered this test for you. [facility] will soon contact you to verify your insurance, address etc. They will then send you the kit; follow the instructions in the kit and return the kit to Cologuard. They will run the test and send the results to me. I will then give you the results. If this test is negative, we recommend repeating a colon cancer screening test in 3 years. If it is positive, I will refer you to a Gastroenterologist so you can get set up for the recommended colonoscopy.  Thank you!

## 2019-06-18 ENCOUNTER — Encounter: Payer: Self-pay | Admitting: Family Medicine

## 2019-06-27 ENCOUNTER — Ambulatory Visit (INDEPENDENT_AMBULATORY_CARE_PROVIDER_SITE_OTHER): Payer: Medicare Other | Admitting: Family Medicine

## 2019-06-27 ENCOUNTER — Ambulatory Visit (INDEPENDENT_AMBULATORY_CARE_PROVIDER_SITE_OTHER): Payer: Medicare Other

## 2019-06-27 ENCOUNTER — Other Ambulatory Visit: Payer: Self-pay

## 2019-06-27 ENCOUNTER — Encounter: Payer: Self-pay | Admitting: Family Medicine

## 2019-06-27 VITALS — BP 128/80 | HR 67 | Temp 98.7°F | Resp 16 | Ht 69.0 in | Wt 176.0 lb

## 2019-06-27 DIAGNOSIS — Z79899 Other long term (current) drug therapy: Secondary | ICD-10-CM

## 2019-06-27 DIAGNOSIS — F329 Major depressive disorder, single episode, unspecified: Secondary | ICD-10-CM | POA: Diagnosis not present

## 2019-06-27 DIAGNOSIS — M5412 Radiculopathy, cervical region: Secondary | ICD-10-CM

## 2019-06-27 DIAGNOSIS — S025XXA Fracture of tooth (traumatic), initial encounter for closed fracture: Secondary | ICD-10-CM

## 2019-06-27 DIAGNOSIS — F411 Generalized anxiety disorder: Secondary | ICD-10-CM | POA: Diagnosis not present

## 2019-06-27 DIAGNOSIS — J4520 Mild intermittent asthma, uncomplicated: Secondary | ICD-10-CM

## 2019-06-27 MED ORDER — ALPRAZOLAM 0.5 MG PO TABS: 0.5000 mg | ORAL_TABLET | Freq: Every day | ORAL | 0 refills | Status: DC

## 2019-06-27 MED ORDER — METHYLPHENIDATE HCL 10 MG PO TABS: 20.0000 mg | ORAL_TABLET | Freq: Every day | ORAL | 0 refills | Status: DC

## 2019-06-27 MED ORDER — ALBUTEROL SULFATE HFA 108 (90 BASE) MCG/ACT IN AERS: 2.0000 | INHALATION_SPRAY | RESPIRATORY_TRACT | 5 refills | Status: DC | PRN

## 2019-06-27 MED ORDER — PREDNISONE 10 MG PO TABS: ORAL_TABLET | ORAL | 0 refills | Status: DC

## 2019-06-27 MED ORDER — LITHIUM CARBONATE 300 MG PO CAPS: 300.0000 mg | ORAL_CAPSULE | Freq: Three times a day (TID) | ORAL | 3 refills | Status: DC

## 2019-06-27 MED ORDER — PAROXETINE HCL 10 MG PO TABS: 10.0000 mg | ORAL_TABLET | Freq: Every day | ORAL | 0 refills | Status: DC

## 2019-06-27 NOTE — Progress Notes (Signed)
Subjective  CC:  Chief Complaint  Patient presents with  . Carpal Tunnel    Left arm, want to get referred  . Broken Tooth    Upper left side unsure if the pain is making his have the neck/base of skull pain  . Psychiatrist referral    Has not had his medications since Sunday, wants a refill and referral    HPI: Corey Collins is a 58 y.o. adult who presents to the office today to address the problems listed above in the chief complaint.  C/o worsening numbness down right arm into fingers. Has c/o paresthesias in fingers before; never went for EMG/NCV studies. No weakness. Some neck pain now.   Broken tooth that hurts with biting w/o gingival swelling.   Had a huge run in with his long term psychiatrist today. Needs new psych and is running out of his medications. Has been out of a few x 4-5 days.    Assessment  1. Cervical radiculopathy   2. GAD (generalized anxiety disorder)   3. Major depression, chronic   4. Mild intermittent extrinsic asthma without complication   5. Closed fracture of tooth, initial encounter   6. High risk medication use      Plan   likley cervical radiculopathy:  pred taper and check xrays .  Mood disorder: refilled mood meds x 30 days, no refills. Needs to establish with new psych. Check lithium and tsh  Fractured tooth. Needs dentist.   Follow up: Return for as scheduled.  08/01/2019  Orders Placed This Encounter  Procedures  . DG Cervical Spine Complete  . Lithium level  . TSH  . Basic metabolic panel   Meds ordered this encounter  Medications  . albuterol (VENTOLIN HFA) 108 (90 Base) MCG/ACT inhaler    Sig: Inhale 2 puffs into the lungs every 4 (four) hours as needed for wheezing or shortness of breath.    Dispense:  18 g    Refill:  5  . ALPRAZolam (XANAX) 0.5 MG tablet    Sig: Take 1 tablet (0.5 mg total) by mouth daily.    Dispense:  30 tablet    Refill:  0  . lithium carbonate 300 MG capsule    Sig: Take 1 capsule (300  mg total) by mouth 3 (three) times daily.    Dispense:  90 capsule    Refill:  3  . DISCONTD: methylphenidate (RITALIN) 10 MG tablet    Sig: Take 2 tablets (20 mg total) by mouth daily.    Dispense:  30 tablet    Refill:  0  . PARoxetine (PAXIL) 10 MG tablet    Sig: Take 1 tablet (10 mg total) by mouth daily.    Dispense:  30 tablet    Refill:  0  . predniSONE (DELTASONE) 10 MG tablet    Sig: Take 4 tabs qd x 2 days, 3 qd x 2 days, 2 qd x 2d, 1qd x 3 days    Dispense:  21 tablet    Refill:  0  . methylphenidate (RITALIN) 10 MG tablet    Sig: Take 2-3 tablets (20-30 mg total) by mouth daily.    Dispense:  75 tablet    Refill:  0    Please disregard prior RX;      I reviewed the patients updated PMH, FH, and SocHx.    Patient Active Problem List   Diagnosis Date Noted  . Prediabetes 02/21/2018    Priority: High  . GAD (  generalized anxiety disorder) 10/11/2017    Priority: High  . Allergic asthma 12/15/2016    Priority: High  . Cigarette nicotine dependence without complication 12/15/2016    Priority: High  . Major depression, chronic 12/15/2016    Priority: High  . 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 PTSD (post-traumatic stress disorder) 12/15/2016    Priority: High  . 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 S/P left THA, AA 02/01/2017    Priority: Medium  . Controlled type 2 diabetes mellitus without complication, without long-term current use of insulin ([facility]) 02/26/2019  . Overweight (BMI 25.0-29.9) 05/25/2017   No outpatient medications have been marked as taking for the 06/27/19 encounter (Office Visit) with Willow OraAndy, Mervil Wacker L, MD.    Allergies: Patient is allergic to sulfa antibiotics. Family History: Patient family history includes Coronary artery disease in her father; Diabetes in an other family member; Hypertension in an other family member. Social History:  Patient  reports that she has been smoking cigarettes. She  has smoked for the past 7.00 years. She has never used smokeless tobacco. She reports current drug use. Drug: Marijuana. She reports that she does not drink alcohol.  Review of Systems: Constitutional: Negative for fever malaise or anorexia Cardiovascular: negative for chest pain Respiratory: negative for SOB or persistent cough Gastrointestinal: negative for abdominal pain  Objective  Vitals: BP 128/80   Pulse 67   Temp 98.7 F (37.1 C) (Tympanic)   Resp 16   Ht 5\' 9"  (1.753 m)   Wt 176 lb (79.8 kg)   SpO2 96%   BMI 25.99 kg/m  General: no acute distress , A&Ox3 HEENT: PEERL, conjunctiva normal, Oropharynx moist,neck is supple Cardiovascular:  RRR without murmur or gallop.  Respiratory:  Good breath sounds bilaterally, CTAB with normal respiratory effort Skin:  Warm, no rashes      Commons side effects, risks, benefits, and alternatives for medications and treatment plan prescribed today were discussed, and the patient expressed understanding of the given instructions. Patient is instructed to call or message via MyChart if he/she has any questions or concerns regarding our treatment plan. No barriers to understanding were identified. We discussed Red Flag symptoms and signs in detail. Patient expressed understanding regarding what to do in case of urgent or emergency type symptoms.   Medication list was reconciled, printed and provided to the patient in AVS. Patient instructions and summary information was reviewed with the patient as documented in the AVS. This note was prepared with assistance of Dragon voice recognition software. Occasional wrong-word or sound-a-like substitutions may have occurred due to the inherent limitations of voice recognition software

## 2019-06-27 NOTE — Patient Instructions (Signed)
Please follow up as scheduled for your next visit with me: 08/01/2019   If you have any questions or concerns, please don't hesitate to send me a message via MyChart or call the office at [phone]. Thank you for visiting with [place] today! It's our pleasure caring for you.  Psychiatry and [place], [place] [phone]  Psychiatrists  [facility]   [facility] Thayer Headings, NP [address], [place], [place] [address] [place], [place] [address] [phone]  Dr. Norma Fredrickson   [facility] [address] [place] [address] [place] [address] [phone]  Sheralyn Boatman, [place] [address] [place] [address] [place] [address] [phone]  Therapists [facility] Pauline Good, NP Magda Paganini NP And they have several counselors there as well.   [facility] [address] [place], [place]  [facility] [place] [facility] [address] [place] [address] [place], [place] [phone]  [facility] [address], [place], [place] [address] [place], [place] [address] [phone]  [facility] [address] [place], [place] [address] [place], [place] [address] [phone]  [facility] [address] [place],  [address] [phone]   These referrals have been provided to you as appropriate for your clinical needs  while taking into account your financial concerns. Please be aware that agencies, practitioners and insurance companies sometimes change contracts. When calling to make an appointment have your insurance information available so the professional you are going to see can confirm whether they are covered by your plan. Take this form with you in case the person you are seeing needs a copy or to contact us.

## 2019-06-28 ENCOUNTER — Encounter: Payer: Self-pay | Admitting: *Deleted

## 2019-06-28 LAB — BASIC METABOLIC PANEL
BUN: 13 mg/dL (ref 6–23)
CO2: 25 mEq/L (ref 19–32)
Calcium: 9.9 mg/dL (ref 8.4–10.5)
Chloride: 105 mEq/L (ref 96–112)
Creatinine, Ser: 0.69 mg/dL (ref 0.40–1.50)
GFR: 118.08 mL/min (ref >60.00)
Glucose, Bld: 115 mg/dL — ABNORMAL HIGH (ref 70–99)
Potassium: 4.7 mEq/L (ref 3.5–5.1)
Sodium: 140 mEq/L (ref 135–145)

## 2019-06-28 LAB — LITHIUM LEVEL: Lithium Lvl: 0.6 mmol/L (ref 0.6–1.2)

## 2019-06-28 LAB — TSH: TSH: 1.83 u[IU]/mL (ref 0.35–4.50)

## 2019-06-28 NOTE — Progress Notes (Signed)
Please call patient: I have reviewed his/her lab results. 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 Hopefully the prednisone will help!

## 2019-06-28 NOTE — Progress Notes (Signed)
Please call patient: I have reviewed his/her lab results. Labs are fine.

## 2019-07-23 ENCOUNTER — Other Ambulatory Visit: Payer: Self-pay | Admitting: Family Medicine

## 2019-07-23 MED ORDER — METFORMIN HCL 500 MG PO TABS: 500.0000 mg | ORAL_TABLET | Freq: Two times a day (BID) | ORAL | 0 refills | Status: DC

## 2019-07-23 NOTE — Telephone Encounter (Signed)
metFORMIN (GLUCOPHAGE) 500 MG table  [facility] [address] [place] [phone] (Phone) [phone] (Fax)  Pt is almost out and pt saw her 3 weeks and she refilled everyhting else

## 2019-08-01 ENCOUNTER — Other Ambulatory Visit: Payer: Self-pay

## 2019-08-01 ENCOUNTER — Ambulatory Visit (INDEPENDENT_AMBULATORY_CARE_PROVIDER_SITE_OTHER): Payer: Medicare Other | Admitting: Family Medicine

## 2019-08-01 ENCOUNTER — Encounter: Payer: Self-pay | Admitting: Family Medicine

## 2019-08-01 VITALS — BP 120/74 | HR 76 | Temp 98.0°F | Resp 16 | Ht 69.0 in | Wt 186.4 lb

## 2019-08-01 DIAGNOSIS — E119 Type 2 diabetes mellitus without complications: Secondary | ICD-10-CM | POA: Diagnosis not present

## 2019-08-01 DIAGNOSIS — F17210 Nicotine dependence, cigarettes, uncomplicated: Secondary | ICD-10-CM | POA: Diagnosis not present

## 2019-08-01 DIAGNOSIS — Z789 Other specified health status: Secondary | ICD-10-CM

## 2019-08-01 DIAGNOSIS — F411 Generalized anxiety disorder: Secondary | ICD-10-CM | POA: Diagnosis not present

## 2019-08-01 DIAGNOSIS — F640 Transsexualism: Secondary | ICD-10-CM

## 2019-08-01 DIAGNOSIS — Z0000 Encounter for general adult medical examination without abnormal findings: Secondary | ICD-10-CM

## 2019-08-01 DIAGNOSIS — F329 Major depressive disorder, single episode, unspecified: Secondary | ICD-10-CM

## 2019-08-01 DIAGNOSIS — F4310 Post-traumatic stress disorder, unspecified: Secondary | ICD-10-CM

## 2019-08-01 DIAGNOSIS — Z23 Encounter for immunization: Secondary | ICD-10-CM

## 2019-08-01 LAB — COMPREHENSIVE METABOLIC PANEL
ALT: 24 U/L (ref 0–53)
AST: 20 U/L (ref 0–37)
Albumin: 4.6 g/dL (ref 3.5–5.2)
Alkaline Phosphatase: 44 U/L (ref 39–117)
BUN: 14 mg/dL (ref 6–23)
CO2: 26 mEq/L (ref 19–32)
Calcium: 9.7 mg/dL (ref 8.4–10.5)
Chloride: 103 mEq/L (ref 96–112)
Creatinine, Ser: 0.67 mg/dL (ref 0.40–1.50)
GFR: 122.11 mL/min (ref >60.00)
Glucose, Bld: 112 mg/dL — ABNORMAL HIGH (ref 70–99)
Potassium: 4.6 mEq/L (ref 3.5–5.1)
Sodium: 139 mEq/L (ref 135–145)
Total Bilirubin: 0.5 mg/dL (ref 0.2–1.2)
Total Protein: 6.7 g/dL (ref 6.0–8.3)

## 2019-08-01 LAB — CBC WITH DIFFERENTIAL/PLATELET
Basophils Absolute: 0.1 10*3/uL (ref 0.0–0.1)
Basophils Relative: 1.4 % (ref 0.0–3.0)
Eosinophils Absolute: 0.3 10*3/uL (ref 0.0–0.7)
Eosinophils Relative: 5.5 % — ABNORMAL HIGH (ref 0.0–5.0)
HCT: 43.3 % (ref 39.0–52.0)
Hemoglobin: 14.4 g/dL (ref 13.0–17.0)
Lymphocytes Relative: 24.8 % (ref 12.0–46.0)
Lymphs Abs: 1.2 10*3/uL (ref 0.7–4.0)
MCHC: 33.3 g/dL (ref 30.0–36.0)
MCV: 95.9 fl (ref 78.0–100.0)
Monocytes Absolute: 0.4 10*3/uL (ref 0.1–1.0)
Monocytes Relative: 8.1 % (ref 3.0–12.0)
Neutro Abs: 3 10*3/uL (ref 1.4–7.7)
Neutrophils Relative %: 60.2 % (ref 43.0–77.0)
Platelets: 248 10*3/uL (ref 150.0–400.0)
RBC: 4.51 Mil/uL (ref 4.22–5.81)
RDW: 14.1 % (ref 11.5–15.5)
WBC: 5 10*3/uL (ref 4.0–10.5)

## 2019-08-01 LAB — HEMOGLOBIN A1C: Hgb A1c MFr Bld: 6.2 % (ref 4.6–6.5)

## 2019-08-01 LAB — TSH: TSH: 1.28 u[IU]/mL (ref 0.35–4.50)

## 2019-08-01 LAB — LIPID PANEL
Cholesterol: 179 mg/dL (ref 0–200)
HDL: 40.1 mg/dL (ref >39.00)
NonHDL: 138.4
Total CHOL/HDL Ratio: 4
Triglycerides: 314 mg/dL — ABNORMAL HIGH (ref 0.0–149.0)
VLDL: 62.8 mg/dL — ABNORMAL HIGH (ref 0.0–40.0)

## 2019-08-01 LAB — MICROALBUMIN / CREATININE URINE RATIO
Creatinine,U: 76.2 mg/dL
Microalb Creat Ratio: 0.9 mg/g (ref 0.0–30.0)
Microalb, Ur: <0.7 mg/dL (ref 0.0–1.9)

## 2019-08-01 LAB — LDL CHOLESTEROL, DIRECT: Direct LDL: 103 mg/dL

## 2019-08-01 MED ORDER — METHYLPHENIDATE HCL 10 MG PO TABS: 20.0000 mg | ORAL_TABLET | Freq: Every day | ORAL | 0 refills | Status: DC

## 2019-08-01 MED ORDER — ALPRAZOLAM 0.5 MG PO TABS: 0.5000 mg | ORAL_TABLET | Freq: Every day | ORAL | 0 refills | Status: DC

## 2019-08-01 MED ORDER — PAROXETINE HCL 10 MG PO TABS: 10.0000 mg | ORAL_TABLET | Freq: Every day | ORAL | 0 refills | Status: DC

## 2019-08-01 NOTE — Progress Notes (Signed)
Subjective  Chief Complaint  Patient presents with  . Annual Exam  . Mood Disorder    out of medications, does not have a psychiatrist yet    HPI: Corey Collins is a 58 y.o. adult who presents to [facility] [place] today for a Male Wellness Visit. She also has the concerns and/or needs as listed above in the chief complaint. These will be addressed in addition to the Health Maintenance Visit.   Wellness Visit: annual visit with health maintenance review and exam without Pap   HM: physically feeling ok. Hasn't yet completed cologuard. Due flu shot and labs Chronic disease f/u and/or acute problem visit: (deemed necessary to be done in addition to the wellness visit):  Psych: mood problems are main concern. Ran out of mood meds last week. See last note. Trying to get in with new psychiatrist. No SI, severe anxiety or w/d sxs.   DM: well controlled. On low dose metformin. Can't afford eye exam. No foot concerns.   Smoker; due to mood problems, unlikely to quit at this time   Assessment  1. Annual physical exam   2. Controlled type 2 diabetes mellitus without complication, without long-term current use of insulin ([place])   3. Cigarette nicotine dependence without complication   4. GAD (generalized anxiety disorder)   5. Major depression, chronic   6. PTSD (post-traumatic stress disorder)   7. Male-to-male transgender person      Plan  Male to Male transgender Wellness Visit:  Age appropriate Health Maintenance and Prevention measures were discussed with patient. Included topics are cancer screening recommendations, ways to keep healthy (see AVS) including dietary and exercise recommendations, regular eye and dental care, use of seat belts, and avoidance of moderate alcohol use and tobacco use.  Still needs to do cologuard.   BMI: discussed patient's BMI and encouraged positive lifestyle modifications to help get to or maintain a target BMI.  HM  needs and immunizations were addressed and ordered. See below for orders. See HM and immunization section for updates.  Routine labs and screening tests ordered including cmp, cbc and lipids where appropriate.  Discussed recommendations regarding Vit D and calcium supplementation (see AVS)  Chronic disease management visit and/or acute problem visit:  DM: check to ensure good control. Check urine. Flu shot today  Mood: refilled meds x 30 days. High risk meds: will see psych.   Colorectal ca screen: discussed completeing cologuard. she has kit at home.  Smoker. Prn albuterol. Denies chronic breathing sxs.   Follow up: Return in about 6 months (around 01/29/2020) for recheck.  Orders Placed This Encounter  Procedures  . CBC with Differential/Platelet  . Comprehensive metabolic panel  . Lipid panel  . HIV Antibody (routine testing w rflx)  . Hemoglobin A1c  . Microalbumin / creatinine urine ratio  . TSH   Meds ordered this encounter  Medications  . ALPRAZolam (XANAX) 0.5 MG tablet    Sig: Take 1 tablet (0.5 mg total) by mouth daily.    Dispense:  30 tablet    Refill:  0  . methylphenidate (RITALIN) 10 MG tablet    Sig: Take 2-3 tablets (20-30 mg total) by mouth daily.    Dispense:  75 tablet    Refill:  0  . PARoxetine (PAXIL) 10 MG tablet    Sig: Take 1 tablet (10 mg total) by mouth daily.    Dispense:  30 tablet    Refill:  0  Lifestyle: Body mass index is 27.53 kg/m. Wt Readings from Last 3 Encounters:  08/01/19 186 lb 6.4 oz (84.6 kg)  06/27/19 176 lb (79.8 kg)  06/14/19 176 lb 12.8 oz (80.2 kg)   D  Patient Active Problem List   Diagnosis Date Noted  . Controlled type 2 diabetes mellitus without complication, without long-term current use of insulin ([place]) 02/26/2019    Priority: High  . GAD (generalized anxiety disorder) 10/11/2017    Priority: High    Managed by psychiatry   . Allergic asthma 12/15/2016    Priority: High  . Cigarette nicotine  dependence without complication 12/75/1700    Priority: High  . Major depression, chronic 12/15/2016    Priority: High    Managed by psychiatry   . Male-to-male transgender person 12/15/2016    Priority: High    Overview:  2010 transitioned. No hormone use now.    Marland Kitchen PTSD (post-traumatic stress disorder) 12/15/2016    Priority: High  . 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 S/P left THA, AA 02/01/2017    Priority: Medium  . Overweight (BMI 25.0-29.9) 05/25/2017   Health Maintenance  Topic Date Due  . Fecal DNA (Cologuard)  03/26/2012  . INFLUENZA VACCINE  06/09/2019  . OPHTHALMOLOGY EXAM  08/01/2019 (Originally 03/26/1971)  . HEMOGLOBIN A1C  12/15/2019  . FOOT EXAM  06/13/2020  . TETANUS/TDAP  07/11/2022  . PNEUMOCOCCAL POLYSACCHARIDE VACCINE AGE 58-64 HIGH RISK  Completed  . Hepatitis C Screening  Completed  . HIV Screening  Completed   Immunization History  Administered Date(s) Administered  . Influenza,inj,Quad PF,6+ Mos 10/13/2017  . Pneumococcal Polysaccharide-23 05/12/2017  . Rabies, IM 07/11/2012  . Rabies, intradermal 07/11/2012  . Tdap 07/11/2012   We updated and reviewed the patient's past history in detail and it is documented below. Allergies: Patient is allergic to sulfa antibiotics. Past Medical History Patient  has a past medical history of Arthritis, Asthma, Borderline diabetes, DVT (deep venous thrombosis) ([place]) (2009), Headache, History of kidney stones, Pneumonia (age 27 or 34), Prediabetes (02/21/2018), Primary osteoarthritis of right hip (12/15/2016), PTSD (post-traumatic stress disorder), Puncture wound, and Seasonal allergies. 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 Family  History: Patient family history includes Coronary artery disease in her father; Diabetes in an other family member; Hypertension in an other family member. Social History:  Patient  reports that she has been smoking cigarettes. She has smoked for the past 7.00 years. She has never used smokeless tobacco. She reports current drug use. Drug: Marijuana. She reports that she does not drink alcohol.  Review of Systems: Constitutional: negative for fever or malaise Ophthalmic: negative for photophobia, double vision or loss of vision Cardiovascular: negative for chest pain, dyspnea on exertion, or new LE swelling Respiratory: negative for SOB or persistent cough Gastrointestinal: negative for abdominal pain, change in bowel habits or melena Genitourinary: negative for dysuria or gross hematuria, no abnormal uterine bleeding or disharge Musculoskeletal: negative for new gait disturbance or muscular weakness Integumentary: negative for new or persistent rashes, no breast lumps Neurological: negative for TIA or stroke symptoms Psychiatric: negative for SI or delusions Allergic/Immunologic: negative for hives  Patient Care Team    Relationship Specialty Notifications Start End  Leamon Arnt, MD PCP - General Family Medicine  01/26/17  Objective  Vitals: BP 120/74   Pulse 76   Temp 98 F (36.7 C) (Tympanic)   Resp 16   Ht _0  (1.753 m)   Wt 186 lb 6.4 oz (84.6 kg)   SpO2 99%   BMI 27.53 kg/m  General:  Well developed, well nourished, no acute distress  Psych:  Alert and orientedx3,hypomanic mood and affect HEENT:  Normocephalic, atraumatic, non-icteric sclera, PERRL, oropharynx is clear without mass or exudate, supple neck without adenopathy, mass or thyromegaly Cardiovascular:  Normal S1, S2, RRR without gallop, rub or murmur Respiratory:  Good breath sounds bilaterally, CTAB with normal respiratory effort Gastrointestinal: normal bowel sounds, soft, non-tender, no noted masses.  No HSM MSK: no deformities, contusions. Joints are without erythema or swelling. Spine and CVA region are nontender Skin:  Warm, no rashes or suspicious lesions noted Neurologic:  CN 2-11 are normal. Gross motor and sensory exams are normal. Normal gait. No tremor    Commons side effects, risks, benefits, and alternatives for medications and treatment plan prescribed today were discussed, and the patient expressed understanding of the given instructions. Patient is instructed to call or message via MyChart if he/she has any questions or concerns regarding our treatment plan. No barriers to understanding were identified. We discussed Red Flag symptoms and signs in detail. Patient expressed understanding regarding what to do in case of urgent or emergency type symptoms.   Medication list was reconciled, printed and provided to the patient in AVS. Patient instructions and summary information was reviewed with the patient as documented in the AVS. This note was prepared with assistance of Dragon voice recognition software. Occasional wrong-word or sound-a-like substitutions may have occurred due to the inherent limitations of voice recognition software

## 2019-08-01 NOTE — Patient Instructions (Addendum)
Please return in 6 months for echeck.   I have refilled your medications for one month. Your lithium has refills on it already.   Today you were given your flu vaccination.  Please send in your Cologuard sample this week.   If you have any questions or concerns, please don't hesitate to send me a message via MyChart or call the office at [phone]. Thank you for visiting with [place] today! It's our pleasure caring for you.

## 2019-08-02 LAB — HIV ANTIBODY (ROUTINE TESTING W REFLEX): HIV 1&2 Ab, 4th Generation: NONREACTIVE

## 2019-08-02 NOTE — Progress Notes (Signed)
Please call patient: I have reviewed his/her lab results. Labs look good. Only recommendation is to increase simvastatin to 20mg  nightly to push cholesterol and vascular disease risk lower.  Can order larger pill if needed. Thanks.

## 2019-08-10 ENCOUNTER — Encounter: Payer: Self-pay | Admitting: *Deleted

## 2019-09-21 ENCOUNTER — Telehealth: Payer: Self-pay | Admitting: Family Medicine

## 2019-09-21 NOTE — Telephone Encounter (Signed)
Attempted to reach patient regarding message about hip pain.  N/A and mailbox was full.  Will try back shortly.

## 2019-09-21 NOTE — Telephone Encounter (Signed)
Copied from [place] [id number]. Topic: Appointment Scheduling - Scheduling Inquiry for Clinic >> Sep 21, 2019 12:51 PM Corey Collins wrote: Reason for CRM: Patient is wanting to be seen today if possible for hip pain . Please advise  Please advise if pt can be worked in for appt today with Dr. Jonni Sanger. Thank you

## 2019-09-21 NOTE — Telephone Encounter (Signed)
Attempted to reach patient again, N/A and again, mailbox was full.

## 2019-09-21 NOTE — Telephone Encounter (Signed)
Attempted to reach patient and there was N/A.  Unable to leave vm due to mailbox being full.

## 2019-09-21 NOTE — Telephone Encounter (Signed)
Spoke with patient.  H/O fall on 11/11 and injured L hip and sustained a small "scuff" on L knee.  He is able to walk w/o difficulty and has FROM in leg/hip.  Pain level is between 1-2 and only concern is because of previous h/o of b/l hip replacement.  Advised patient that we do not have any available appointments today, however I would run this by Dr. Jonni Sanger for advisement on next steps.  Advised patient that I would call him back.  He verbalized understanding.  Please advise

## 2019-09-21 NOTE — Telephone Encounter (Signed)
If she is feeling fine, then nothing further is needed at this time. Shouldn't be harmful to the hip replacement.

## 2019-09-21 NOTE — Telephone Encounter (Signed)
Pt returned call to the office. Pt requests call back. °

## 2019-09-25 NOTE — Telephone Encounter (Signed)
Left detailed vm message advising patient that nothing further is needed at this time if minimal-no pain.  Advised patient if pain worsens or develops new symptoms, please call us back.

## 2019-10-12 ENCOUNTER — Ambulatory Visit: Payer: Medicare Other | Admitting: Family Medicine

## 2019-12-10 ENCOUNTER — Telehealth: Payer: Self-pay

## 2019-12-10 ENCOUNTER — Other Ambulatory Visit: Payer: Self-pay

## 2019-12-10 MED ORDER — METFORMIN HCL 500 MG PO TABS: 500.0000 mg | ORAL_TABLET | Freq: Two times a day (BID) | ORAL | 3 refills | Status: DC

## 2019-12-10 NOTE — Telephone Encounter (Signed)
MEDICATION:ALPRAZolam Prudy Feeler) 0.5 MG tablet  PHARMACY: [facility] [address], [place] - [address] [place] Phone:  [phone]  Fax:  [phone]       Comments: patient is completely out of medication   **Let patient know to contact pharmacy at the end of the day to make sure medication is ready. **  ** Please notify patient to allow 48-72 hours to process**  **Encourage patient to contact the pharmacy for refills or they can request refills through [facility]**

## 2019-12-10 NOTE — Telephone Encounter (Signed)
Metformin sent to [facility] [address]

## 2019-12-10 NOTE — Telephone Encounter (Signed)
MEDICATION:metFORMIN (GLUCOPHAGE) 500 MG tablet  PHARMACY: [facility] [address] [place] - [address] [place] Phone:  [phone]  Fax:  [phone]       Comments:   **Let patient know to contact pharmacy at the end of the day to make sure medication is ready. **  ** Please notify patient to allow 48-72 hours to process**  **Encourage patient to contact the pharmacy for refills or they can request refills through [facility]**

## 2019-12-13 ENCOUNTER — Telehealth: Payer: Self-pay | Admitting: Family Medicine

## 2019-12-13 NOTE — Telephone Encounter (Signed)
I left a message asking the patient to call and schedule Medicare AWV-I with Toni Amend [facility] Coach) on 12/18/2019 after seeing Dr. Mardelle Matte.  I'm waiting for a call back to either confirm or decline the appointment. If patient calls back, please update appointment notes.  VDM (Dee-Dee)

## 2019-12-18 ENCOUNTER — Ambulatory Visit: Payer: Medicare Other | Admitting: Family Medicine

## 2019-12-18 ENCOUNTER — Ambulatory Visit: Payer: Medicare Other

## 2019-12-18 DIAGNOSIS — Z0289 Encounter for other administrative examinations: Secondary | ICD-10-CM

## 2020-01-29 ENCOUNTER — Ambulatory Visit: Payer: Medicare Other | Admitting: Family Medicine

## 2020-02-11 ENCOUNTER — Telehealth: Payer: Self-pay | Admitting: Family Medicine

## 2020-02-11 NOTE — Telephone Encounter (Signed)
I left a message asking the pt to call and schedule AWV w/ Toni Amend and follow up visit w/ Dr. Mardelle Matte.

## 2020-02-26 ENCOUNTER — Ambulatory Visit: Payer: Medicare Other | Admitting: Family Medicine

## 2020-02-27 ENCOUNTER — Other Ambulatory Visit: Payer: Self-pay

## 2020-02-27 ENCOUNTER — Encounter: Payer: Self-pay | Admitting: Family Medicine

## 2020-02-27 ENCOUNTER — Ambulatory Visit (INDEPENDENT_AMBULATORY_CARE_PROVIDER_SITE_OTHER): Payer: Medicare Other | Admitting: Family Medicine

## 2020-02-27 VITALS — BP 130/70 | HR 69 | Temp 95.0°F | Resp 16 | Ht 69.0 in | Wt 183.0 lb

## 2020-02-27 DIAGNOSIS — E119 Type 2 diabetes mellitus without complications: Secondary | ICD-10-CM | POA: Diagnosis not present

## 2020-02-27 DIAGNOSIS — F4310 Post-traumatic stress disorder, unspecified: Secondary | ICD-10-CM

## 2020-02-27 DIAGNOSIS — E782 Mixed hyperlipidemia: Secondary | ICD-10-CM

## 2020-02-27 DIAGNOSIS — F17210 Nicotine dependence, cigarettes, uncomplicated: Secondary | ICD-10-CM

## 2020-02-27 DIAGNOSIS — R29898 Other symptoms and signs involving the musculoskeletal system: Secondary | ICD-10-CM

## 2020-02-27 DIAGNOSIS — J4520 Mild intermittent asthma, uncomplicated: Secondary | ICD-10-CM

## 2020-02-27 DIAGNOSIS — F411 Generalized anxiety disorder: Secondary | ICD-10-CM

## 2020-02-27 DIAGNOSIS — E1169 Type 2 diabetes mellitus with other specified complication: Secondary | ICD-10-CM

## 2020-02-27 DIAGNOSIS — F329 Major depressive disorder, single episode, unspecified: Secondary | ICD-10-CM

## 2020-02-27 DIAGNOSIS — M25552 Pain in left hip: Secondary | ICD-10-CM

## 2020-02-27 HISTORY — DX: Type 2 diabetes mellitus with other specified complication: E11.69

## 2020-02-27 LAB — POCT GLYCOSYLATED HEMOGLOBIN (HGB A1C): Hemoglobin A1C: 6.4 % — AB (ref 4.0–5.6)

## 2020-02-27 MED ORDER — SIMVASTATIN 20 MG PO TABS: 20.0000 mg | ORAL_TABLET | Freq: Every day | ORAL | 3 refills | Status: DC

## 2020-02-27 NOTE — Progress Notes (Signed)
Subjective  CC:  Chief Complaint  Patient presents with  . Arm Pain    right arm, denies numbness  . Nasal Congestion    states she does not believe it's allergies, x 12 days  . Referral    requesting referral to see ortho who replaced her hip 2018 - [facility]  . flea bites    rental home is infested with rats and fleas    HPI: Corey Collins is a 59 y.o. adult who presents to the office today for follow up of diabetes and problems listed above in the chief complaint.   Diabetes follow up: Her diabetic control is reported as Unchanged. On metformin.  She denies exertional CP or SOB or symptomatic hypoglycemia. She denies foot sores or paresthesias.   Right arm pain and weakness: ongoing concern for > 1 year.hasn't been able to have EMG/NCV testing done on numerous occassions. Could be coming from neck. Would like to see Dr. Charlann Boxer and also wants right hip rechecked: s/p TAH; stepped hard and has some pain now intermittently. No leg weakness.   Allergies: rhinorrhea. Worries due to rats in basement of rental home.   HLD: ran out of statins. No AEs when he was on them.   HM: due crc screen. Has 2 cologuard kits at home. Wt Readings from Last 3 Encounters:  02/27/20 183 lb (83 kg)  08/01/19 186 lb 6.4 oz (84.6 kg)  06/27/19 176 lb (79.8 kg)    BP Readings from Last 3 Encounters:  02/27/20 130/70  08/01/19 120/74  06/27/19 128/80    Assessment  1. Controlled type 2 diabetes mellitus without complication, without long-term current use of insulin ([facility])   2. Cigarette nicotine dependence without complication   3. Combined hyperlipidemia associated with type 2 diabetes mellitus ([facility])   4. GAD (generalized anxiety disorder)   5. Major depression, chronic   6. PTSD (post-traumatic stress disorder)   7. Mild intermittent extrinsic asthma without complication   8. Right arm weakness   9. Left hip pain      Plan   Diabetes is currently very well controlled.  Continue metformin. Refuses eye exam at this time.   Mood per psych; improved  Smoker on albuterol prn  Refer to ortho for right arm radicular sxs and left hip pain.   Allergies: start OTC meds  HLD: increase simvastatin dose.   HM: recommend sending in cologuard  Follow up: No follow-ups on file.. Orders Placed This Encounter  Procedures  . Ambulatory referral to Orthopedic Surgery   Meds ordered this encounter  Medications  . simvastatin (ZOCOR) 20 MG tablet    Sig: Take 1 tablet (20 mg total) by mouth at bedtime.    Dispense:  90 tablet    Refill:  3      Immunization History  Administered Date(s) Administered  . Influenza,inj,Quad PF,6+ Mos 10/13/2017, 08/01/2019  . Pneumococcal Polysaccharide-23 05/12/2017  . Rabies, IM 07/11/2012  . Rabies, intradermal 07/11/2012  . Tdap 07/11/2012    Diabetes Related Lab Review: Lab Results  Component Value Date   HGBA1C 6.2 08/01/2019   HGBA1C 5.9 (A) 06/14/2019   HGBA1C 6.7 (A) 11/27/2018    Lab Results  Component Value Date   MICROALBUR <0.7 08/01/2019   Lab Results  Component Value Date   CREATININE 0.67 08/01/2019   BUN 14 08/01/2019   NA 139 08/01/2019   K 4.6 08/01/2019   CL 103 08/01/2019   CO2 26 08/01/2019   Lab  Results  Component Value Date   CHOL 179 08/01/2019   CHOL 153 05/31/2018   Lab Results  Component Value Date   HDL 40.10 08/01/2019   HDL 37.20 (L) 05/31/2018   Lab Results  Component Value Date   LDLCALC 93 05/31/2018   Lab Results  Component Value Date   TRIG 314.0 (H) 08/01/2019   TRIG 113.0 05/31/2018   Lab Results  Component Value Date   CHOLHDL 4 08/01/2019   CHOLHDL 4 05/31/2018   Lab Results  Component Value Date   LDLDIRECT 103.0 08/01/2019   The 10-year ASCVD risk score Denman George DC Jr., et al., 2013) is: 23%   Values used to calculate the score:     Age: 68 years     Sex: Male     Is Non-Hispanic African American: No     Diabetic: Yes     Tobacco smoker: Yes      Systolic Blood Pressure: 130 mmHg     Is BP treated: No     HDL Cholesterol: 40.1 mg/dL     Total Cholesterol: 179 mg/dL I have reviewed the PMH, Fam and Soc history. Patient Active Problem List   Diagnosis Date Noted  . Combined hyperlipidemia associated with type 2 diabetes mellitus (HCC) 02/27/2020    Priority: High  . Controlled type 2 diabetes mellitus without complication, without long-term current use of insulin ([facility]) 02/26/2019    Priority: High  . GAD (generalized anxiety disorder) 10/11/2017    Priority: High    Managed by psychiatry   . Allergic asthma 12/15/2016    Priority: High  . Cigarette nicotine dependence without complication 12/15/2016    Priority: High  . Major depression, chronic 12/15/2016    Priority: High    Managed by psychiatry   . Male-to-male transgender person 12/15/2016    Priority: High    Overview:  2010 transitioned. No hormone use now.    Marland Kitchen PTSD (post-traumatic stress disorder) 12/15/2016    Priority: High  . 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 S/P left THA, AA 02/01/2017    Priority: Medium  . Overweight (BMI 25.0-29.9) 05/25/2017    Social History: Patient  reports that she has been smoking cigarettes. She has smoked for the past 7.00 years. She has never used smokeless tobacco. She reports current drug use. Drug: Marijuana. She reports that she does not drink alcohol.  Review of Systems: Ophthalmic: negative for eye pain, loss of vision or double vision Cardiovascular: negative for chest pain Respiratory: negative for SOB or persistent cough Gastrointestinal: negative for abdominal pain Genitourinary: negative for dysuria or gross hematuria MSK: negative for foot lesions Neurologic: negative for weakness or gait disturbance  Objective  Vitals: BP 130/70   Pulse 69   Temp (!) 95 F (35 C) (Temporal)   Resp 16   Ht 5\' 9"  (1.753 m)   Wt 183 lb (83 kg)   SpO2 97%   BMI 27.02 kg/m  General: well  appearing, no acute distress , disheveled Psych:  Alert and oriented, normal mood and affect Cardiovascular:  Nl S1 and S2, RRR without murmur, gallop or rub. no edema Respiratory:  Coarse breath sounds with rhonchi/ w/o wheeze or rales  Diabetic education: ongoing education regarding chronic disease management for diabetes was given today. We continue to reinforce the ABC's of diabetic management: A1c (<7 or 8 dependent upon patient), tight blood pressure control, and cholesterol management with goal LDL < 100 minimally. We  discuss diet strategies, exercise recommendations, medication options and possible side effects. At each visit, we review recommended immunizations and preventive care recommendations for diabetics and stress that good diabetic control can prevent other problems. See below for this patient's data.    Commons side effects, risks, benefits, and alternatives for medications and treatment plan prescribed today were discussed, and the patient expressed understanding of the given instructions. Patient is instructed to call or message via MyChart if he/she has any questions or concerns regarding our treatment plan. No barriers to understanding were identified. We discussed Red Flag symptoms and signs in detail. Patient expressed understanding regarding what to do in case of urgent or emergency type symptoms.   Medication list was reconciled, printed and provided to the patient in AVS. Patient instructions and summary information was reviewed with the patient as documented in the AVS. This note was prepared with assistance of Dragon voice recognition software. Occasional wrong-word or sound-a-like substitutions may have occurred due to the inherent limitations of voice recognition software  This visit occurred during the SARS-CoV-2 public health emergency.  Safety protocols were in place, including screening questions prior to the visit, additional usage of staff PPE, and extensive  cleaning of exam room while observing appropriate contact time as indicated for disinfecting solutions.

## 2020-02-27 NOTE — Patient Instructions (Signed)
Please return in September 2021 for your annual complete physical; please come fasting.  I have increased the dose of your simvastatin for your cholesterol.  I have referred your back to Dr. Charlann Boxer for arm and hip evaluations.  Send in your cologuard test through [employer] ASAP.  If you have any questions or concerns, please don't hesitate to send me a message via MyChart or call the office at [phone]. Thank you for visiting with [place] today! It's our pleasure caring for you.

## 2020-05-21 ENCOUNTER — Ambulatory Visit: Payer: Medicare Other | Admitting: Family Medicine

## 2020-05-21 ENCOUNTER — Telehealth: Payer: Self-pay | Admitting: Family Medicine

## 2020-05-21 DIAGNOSIS — Z0289 Encounter for other administrative examinations: Secondary | ICD-10-CM

## 2020-05-21 NOTE — Telephone Encounter (Signed)
  LAST APPOINTMENT DATE: 02/27/2020   NEXT APPOINTMENT DATE:@8 /10/2020  MEDICATION: metFORMIN (GLUCOPHAGE) 500 MG tablet  [facility]ACY: [facility] [address], [place] - [address]

## 2020-05-22 ENCOUNTER — Other Ambulatory Visit: Payer: Self-pay

## 2020-05-22 MED ORDER — METFORMIN HCL 500 MG PO TABS: 500.0000 mg | ORAL_TABLET | Freq: Two times a day (BID) | ORAL | 0 refills | Status: DC

## 2020-05-22 NOTE — Telephone Encounter (Signed)
90 day supply sent to pharmacy

## 2020-06-19 ENCOUNTER — Ambulatory Visit (INDEPENDENT_AMBULATORY_CARE_PROVIDER_SITE_OTHER): Payer: Medicare Other | Admitting: Family Medicine

## 2020-06-19 ENCOUNTER — Encounter: Payer: Self-pay | Admitting: Family Medicine

## 2020-06-19 ENCOUNTER — Other Ambulatory Visit: Payer: Self-pay

## 2020-06-19 VITALS — BP 122/78 | HR 49 | Temp 98.1°F | Ht 69.0 in | Wt 181.4 lb

## 2020-06-19 DIAGNOSIS — E119 Type 2 diabetes mellitus without complications: Secondary | ICD-10-CM

## 2020-06-19 DIAGNOSIS — F411 Generalized anxiety disorder: Secondary | ICD-10-CM | POA: Diagnosis not present

## 2020-06-19 DIAGNOSIS — F329 Major depressive disorder, single episode, unspecified: Secondary | ICD-10-CM

## 2020-06-19 DIAGNOSIS — E1169 Type 2 diabetes mellitus with other specified complication: Secondary | ICD-10-CM

## 2020-06-19 DIAGNOSIS — F17210 Nicotine dependence, cigarettes, uncomplicated: Secondary | ICD-10-CM | POA: Diagnosis not present

## 2020-06-19 DIAGNOSIS — J4520 Mild intermittent asthma, uncomplicated: Secondary | ICD-10-CM

## 2020-06-19 DIAGNOSIS — E782 Mixed hyperlipidemia: Secondary | ICD-10-CM

## 2020-06-19 LAB — POCT GLYCOSYLATED HEMOGLOBIN (HGB A1C): Hemoglobin A1C: 5.9 % — AB (ref 4.0–5.6)

## 2020-06-19 NOTE — Patient Instructions (Signed)
Please follow up as scheduled for your next visit with me: 09/10/2020   Your diabetes is very well controlled.  We will call you with your lab results.   If you have any questions or concerns, please don't hesitate to send me a message via MyChart or call the office at [phone]. Thank you for visiting with [place] today! It's our pleasure caring for you.

## 2020-06-19 NOTE — Progress Notes (Signed)
Subjective  CC:  Chief Complaint  Patient presents with  . Diabetes    was temporarily off metformin - script expired      HPI: Corey Collins is a 59 y.o. adult who presents to the office today for follow up of diabetes and problems listed above in the chief complaint.   Diabetes follow up: Her diabetic control is reported as Unchanged.  She is back on her medication and she eats a diabetic diet She denies exertional CP or SOB or symptomatic hypoglycemia. She denies foot sores or paresthesias.   Hyperlipidemia on statin due for recheck.  She is nonfasting today.  No adverse effects from her statin.  Was staying with her mom who has multiple cats.  Suffered what sounds like an allergic asthma exacerbation but since being home she is better.  No longer wheezing.  No cough.  No fevers or chills.  She reports she has a rescue inhaler that she uses infrequently.  She continues to smoke.  Denies daily cough or wheezing.  Wt Readings from Last 3 Encounters:  06/19/20 181 lb 6.4 oz (82.3 kg)  02/27/20 183 lb (83 kg)  08/01/19 186 lb 6.4 oz (84.6 kg)    BP Readings from Last 3 Encounters:  06/19/20 122/78  02/27/20 130/70  08/01/19 120/74    Assessment  1. Controlled type 2 diabetes mellitus without complication, without long-term current use of insulin (HCC)   2. Combined hyperlipidemia associated with type 2 diabetes mellitus (HCC)   3. Cigarette nicotine dependence without complication   4. GAD (generalized anxiety disorder)   5. Major depression, chronic   6. Mild intermittent extrinsic asthma without complication      Plan   Diabetes is currently very well controlled.  Continue Metformin and diabetic diet.  Hyperlipidemia recheck labs today  Allergic asthma, currently stable.  Will work to avoid triggers when she can.  Precontemplative about smoking cessation.  Declines Covid vaccine at this time.  Follow up: No follow-ups on file.. Orders Placed This  Encounter  Procedures  . CBC with Differential/Platelet  . Comprehensive metabolic panel  . Lipid panel  . Microalbumin / creatinine urine ratio  . POCT HgB A1C   No orders of the defined types were placed in this encounter.     Immunization History  Administered Date(s) Administered  . Influenza,inj,Quad PF,6+ Mos 10/13/2017, 08/01/2019  . Pneumococcal Polysaccharide-23 05/12/2017  . Rabies, IM 07/11/2012  . Rabies, intradermal 07/11/2012  . Tdap 07/11/2012    Diabetes Related Lab Review: Lab Results  Component Value Date   HGBA1C 5.9 (A) 06/19/2020   HGBA1C 6.4 (A) 02/27/2020   HGBA1C 6.2 08/01/2019    Lab Results  Component Value Date   MICROALBUR <0.7 08/01/2019   Lab Results  Component Value Date   CREATININE 0.67 08/01/2019   BUN 14 08/01/2019   NA 139 08/01/2019   K 4.6 08/01/2019   CL 103 08/01/2019   CO2 26 08/01/2019   Lab Results  Component Value Date   CHOL 179 08/01/2019   CHOL 153 05/31/2018   Lab Results  Component Value Date   HDL 40.10 08/01/2019   HDL 37.20 (L) 05/31/2018   Lab Results  Component Value Date   LDLCALC 93 05/31/2018   Lab Results  Component Value Date   TRIG 314.0 (H) 08/01/2019   TRIG 113.0 05/31/2018   Lab Results  Component Value Date   CHOLHDL 4 08/01/2019   CHOLHDL 4 05/31/2018   Lab Results  Component Value Date   LDLDIRECT 103.0 08/01/2019   The 10-year ASCVD risk score Denman George DC Montez Hageman., et al., 2013) is: 21.9%   Values used to calculate the score:     Age: 27 years     Sex: Male     Is Non-Hispanic African American: No     Diabetic: Yes     Tobacco smoker: Yes     Systolic Blood Pressure: 122 mmHg     Is BP treated: No     HDL Cholesterol: 40.1 mg/dL     Total Cholesterol: 179 mg/dL I have reviewed the PMH, Fam and Soc history. Patient Active Problem List   Diagnosis Date Noted  . Combined hyperlipidemia associated with type 2 diabetes mellitus (HCC) 02/27/2020    Priority: High  . Controlled  type 2 diabetes mellitus without complication, without long-term current use of insulin (HCC) 02/26/2019    Priority: High  . GAD (generalized anxiety disorder) 10/11/2017    Priority: High    Managed by psychiatry   . Allergic asthma 12/15/2016    Priority: High  . Cigarette nicotine dependence without complication 12/15/2016    Priority: High  . Major depression, chronic 12/15/2016    Priority: High    Managed by psychiatry   . Male-to-male transgender person 12/15/2016    Priority: High    Overview:  2010 transitioned. No hormone use now.    Marland Kitchen PTSD (post-traumatic stress disorder) 12/15/2016    Priority: High  . 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 S/P left THA, AA 02/01/2017    Priority: Medium  . Overweight (BMI 25.0-29.9) 05/25/2017    Social History: Patient  reports that she has been smoking cigarettes. She has smoked for the past 7.00 years. She has never used smokeless tobacco. She reports current drug use. Drug: Marijuana. She reports that she does not drink alcohol.  Review of Systems: Ophthalmic: negative for eye pain, loss of vision or double vision Cardiovascular: negative for chest pain Respiratory: negative for SOB or persistent cough Gastrointestinal: negative for abdominal pain Genitourinary: negative for dysuria or gross hematuria MSK: negative for foot lesions Neurologic: negative for weakness or gait disturbance  Objective  Vitals: BP 122/78   Pulse (!) 49   Temp 98.1 F (36.7 C) (Temporal)   Ht 5\' 9"  (1.753 m)   Wt 181 lb 6.4 oz (82.3 kg)   SpO2 97%   BMI 26.79 kg/m  General: well appearing, no acute distress  Psych:  Alert and oriented, normal mood and affect HEENT:  Normocephalic, atraumatic, moist mucous membranes, supple neck  Cardiovascular:  Nl S1 and S2, RRR without murmur, gallop or rub. no edema Respiratory:  Good breath sounds bilaterally, CTAB with normal effort, no rales  Diabetic education: ongoing education  regarding chronic disease management for diabetes was given today. We continue to reinforce the ABC's of diabetic management: A1c (<7 or 8 dependent upon patient), tight blood pressure control, and cholesterol management with goal LDL < 100 minimally. We discuss diet strategies, exercise recommendations, medication options and possible side effects. At each visit, we review recommended immunizations and preventive care recommendations for diabetics and stress that good diabetic control can prevent other problems. See below for this patient's data.    Commons side effects, risks, benefits, and alternatives for medications and treatment plan prescribed today were discussed, and the patient expressed understanding of the given instructions. Patient is instructed to call or message via MyChart if he/she has any  questions or concerns regarding our treatment plan. No barriers to understanding were identified. We discussed Red Flag symptoms and signs in detail. Patient expressed understanding regarding what to do in case of urgent or emergency type symptoms.   Medication list was reconciled, printed and provided to the patient in AVS. Patient instructions and summary information was reviewed with the patient as documented in the AVS. This note was prepared with assistance of Dragon voice recognition software. Occasional wrong-word or sound-a-like substitutions may have occurred due to the inherent limitations of voice recognition software  This visit occurred during the SARS-CoV-2 public health emergency.  Safety protocols were in place, including screening questions prior to the visit, additional usage of staff PPE, and extensive cleaning of exam room while observing appropriate contact time as indicated for disinfecting solutions.

## 2020-06-20 LAB — COMPREHENSIVE METABOLIC PANEL
AG Ratio: 2.3 (calc) (ref 1.0–2.5)
ALT: 17 U/L (ref 9–46)
AST: 14 U/L (ref 10–35)
Albumin: 4.6 g/dL (ref 3.6–5.1)
Alkaline phosphatase (APISO): 52 U/L (ref 35–144)
BUN: 10 mg/dL (ref 7–25)
CO2: 28 mmol/L (ref 20–32)
Calcium: 9.9 mg/dL (ref 8.6–10.3)
Chloride: 105 mmol/L (ref 98–110)
Creat: 0.89 mg/dL (ref 0.70–1.33)
Globulin: 2 g/dL (calc) (ref 1.9–3.7)
Glucose, Bld: 154 mg/dL — ABNORMAL HIGH (ref 65–99)
Potassium: 4.5 mmol/L (ref 3.5–5.3)
Sodium: 138 mmol/L (ref 135–146)
Total Bilirubin: 0.3 mg/dL (ref 0.2–1.2)
Total Protein: 6.6 g/dL (ref 6.1–8.1)

## 2020-06-20 LAB — CBC WITH DIFFERENTIAL/PLATELET
Absolute Monocytes: 647 cells/uL (ref 200–950)
Basophils Absolute: 83 cells/uL (ref 0–200)
Basophils Relative: 1 %
Eosinophils Absolute: 855 cells/uL — ABNORMAL HIGH (ref 15–500)
Eosinophils Relative: 10.3 %
HCT: 43.4 % (ref 38.5–50.0)
Hemoglobin: 14.5 g/dL (ref 13.2–17.1)
Lymphs Abs: 1843 cells/uL (ref 850–3900)
MCH: 31 pg (ref 27.0–33.0)
MCHC: 33.4 g/dL (ref 32.0–36.0)
MCV: 92.9 fL (ref 80.0–100.0)
MPV: 9.2 fL (ref 7.5–12.5)
Monocytes Relative: 7.8 %
Neutro Abs: 4872 cells/uL (ref 1500–7800)
Neutrophils Relative %: 58.7 %
Platelets: 276 10*3/uL (ref 140–400)
RBC: 4.67 10*6/uL (ref 4.20–5.80)
RDW: 12.9 % (ref 11.0–15.0)
Total Lymphocyte: 22.2 %
WBC: 8.3 10*3/uL (ref 3.8–10.8)

## 2020-06-20 LAB — LIPID PANEL
Cholesterol: 136 mg/dL (ref <200)
HDL: 36 mg/dL — ABNORMAL LOW (ref >=40)
LDL Cholesterol (Calc): 68 mg/dL (calc)
Non-HDL Cholesterol (Calc): 100 mg/dL (calc) (ref <130)
Total CHOL/HDL Ratio: 3.8 (calc) (ref <5.0)
Triglycerides: 229 mg/dL — ABNORMAL HIGH (ref <150)

## 2020-06-20 LAB — MICROALBUMIN / CREATININE URINE RATIO
Creatinine, Urine: 167 mg/dL (ref 20–320)
Microalb Creat Ratio: 5 mcg/mg creat (ref <30)
Microalb, Ur: 0.8 mg/dL

## 2020-06-20 NOTE — Progress Notes (Signed)
Please call patient: I have reviewed his/her lab results. Results look good. Sugar was the only abnormality as expected. Cholesterol and other labs are all good!

## 2020-07-04 ENCOUNTER — Other Ambulatory Visit: Payer: Self-pay | Admitting: Family Medicine

## 2020-07-04 NOTE — Telephone Encounter (Signed)
MEDICATION: Paxil 10 MG, Ritalin 10 MG  PHARMACY: [facility] [address] [place]  Comments:   **Let patient know to contact pharmacy at the end of the day to make sure medication is ready. **  ** Please notify patient to allow 48-72 hours to process**  **Encourage patient to contact the pharmacy for refills or they can request refills through [facility]**

## 2020-07-08 NOTE — Telephone Encounter (Signed)
Last OV: 06/19/20 dx. DM f/u  Last refill: 08/01/19 #75,0  - Ritalin                  08/01/19 #30, 0 - Paxil ( discontinued on 02/27/20)

## 2020-07-29 ENCOUNTER — Encounter: Payer: Medicare Other | Admitting: Family Medicine

## 2020-09-10 ENCOUNTER — Encounter: Payer: Medicare Other | Admitting: Family Medicine

## 2020-09-10 ENCOUNTER — Telehealth: Payer: Medicare Other | Admitting: Family Medicine

## 2020-09-29 ENCOUNTER — Other Ambulatory Visit: Payer: Self-pay

## 2020-09-29 ENCOUNTER — Encounter: Payer: Self-pay | Admitting: Family Medicine

## 2020-09-29 ENCOUNTER — Ambulatory Visit (INDEPENDENT_AMBULATORY_CARE_PROVIDER_SITE_OTHER): Payer: Medicare Other | Admitting: Family Medicine

## 2020-09-29 VITALS — BP 134/65 | HR 54 | Temp 99.3°F | Ht 69.0 in | Wt 192.0 lb

## 2020-09-29 DIAGNOSIS — F411 Generalized anxiety disorder: Secondary | ICD-10-CM

## 2020-09-29 DIAGNOSIS — Z0000 Encounter for general adult medical examination without abnormal findings: Secondary | ICD-10-CM | POA: Diagnosis not present

## 2020-09-29 DIAGNOSIS — E1169 Type 2 diabetes mellitus with other specified complication: Secondary | ICD-10-CM

## 2020-09-29 DIAGNOSIS — F329 Major depressive disorder, single episode, unspecified: Secondary | ICD-10-CM | POA: Diagnosis not present

## 2020-09-29 DIAGNOSIS — E782 Mixed hyperlipidemia: Secondary | ICD-10-CM

## 2020-09-29 DIAGNOSIS — E119 Type 2 diabetes mellitus without complications: Secondary | ICD-10-CM | POA: Diagnosis not present

## 2020-09-29 DIAGNOSIS — M4722 Other spondylosis with radiculopathy, cervical region: Secondary | ICD-10-CM | POA: Insufficient documentation

## 2020-09-29 DIAGNOSIS — G5601 Carpal tunnel syndrome, right upper limb: Secondary | ICD-10-CM | POA: Insufficient documentation

## 2020-09-29 DIAGNOSIS — G5621 Lesion of ulnar nerve, right upper limb: Secondary | ICD-10-CM

## 2020-09-29 DIAGNOSIS — M47812 Spondylosis without myelopathy or radiculopathy, cervical region: Secondary | ICD-10-CM

## 2020-09-29 DIAGNOSIS — L219 Seborrheic dermatitis, unspecified: Secondary | ICD-10-CM

## 2020-09-29 HISTORY — DX: Spondylosis without myelopathy or radiculopathy, cervical region: M47.812

## 2020-09-29 MED ORDER — SIMVASTATIN 20 MG PO TABS
20.0000 mg | ORAL_TABLET | Freq: Every day | ORAL | 3 refills | Status: DC
Start: 2020-09-29 — End: 2022-02-03

## 2020-09-29 MED ORDER — METHYLPHENIDATE HCL 10 MG PO TABS: 20.0000 mg | ORAL_TABLET | Freq: Every day | ORAL | 0 refills | Status: DC

## 2020-09-29 MED ORDER — POLYETHYLENE GLYCOL 3350 17 GM/SCOOP PO POWD: 17.0000 g | Freq: Two times a day (BID) | ORAL | 1 refills | Status: AC | PRN

## 2020-09-29 MED ORDER — KETOCONAZOLE 2 % EX SHAM
1.0000 | MEDICATED_SHAMPOO | CUTANEOUS | 1 refills | Status: DC
Start: 2020-09-29 — End: 2022-01-11

## 2020-09-29 MED ORDER — PAROXETINE HCL 20 MG PO TABS
20.0000 mg | ORAL_TABLET | Freq: Every day | ORAL | 0 refills | Status: DC
Start: 2020-09-29 — End: 2022-02-03

## 2020-09-29 MED ORDER — METFORMIN HCL 500 MG PO TABS
500.0000 mg | ORAL_TABLET | Freq: Two times a day (BID) | ORAL | 3 refills | Status: DC
Start: 2020-09-29 — End: 2022-02-03

## 2020-09-29 NOTE — Progress Notes (Signed)
Subjective  Chief Complaint  Patient presents with  . Annual Exam    not fasting  . Head itching    states that you prescibed cream for it before.     HPI: Corey Collins is a 59 y.o. adult who presents to [facility] [place] today for a Male Wellness Visit. She also has the concerns and/or needs as listed above in the chief complaint. These will be addressed in addition to the Health Maintenance Visit.   Wellness Visit: annual visit with health maintenance review and exam without Pap (M to F transgender)   HM: needs colorectal ca screening still. Ordered cologuard.  Chronic disease f/u and/or acute problem visit: (deemed necessary to be done in addition to the wellness visit):  Diabetes follow up: Her diabetic control is reported as Unchanged.  No symptoms of hyperglycemia. She denies exertional CP or SOB or symptomatic hypoglycemia. She denies foot sores or paresthesias.  Seborrheic dermatitis of the scalp: Active again.  Would like to have Nizoral shampoo ordered.  It helps before.  ADD and mood disorder: Mood medicines were sent to his mother's home.  She will be able to get them for several days.  The psychiatric office will not be able to call them in without an appointment.  She cannot get there.  She would like a refill to get him through the next several days.  She has been without Paxil for over a week.  She is having withdrawal symptoms, moodiness, emotional lability, anxiety.  She is also having trouble focusing due to not having her ADD medications.  Carpal tunnel on the right and cervical radiculopathy: Reviewed orthopedic records including MRI reports and EMG studies.  She is going through some physical therapy.  She will have follow-up there.  May need surgical repair.  Pain is fairly well controlled.  She continues to smoke.  She has allergy induced asthma and likely COPD.  Worse around cats.  Reports she is currently stable.  Uses albuterol as  needed.  Mostly when she is around her mother's cats.   HLD on statin.  Tolerates well.  Reviewed recent labs.  Well-controlled. Immunization History  Administered Date(s) Administered  . Influenza,inj,Quad PF,6+ Mos 10/13/2017, 08/01/2019  . Pneumococcal Polysaccharide-23 05/12/2017  . Rabies, IM 07/11/2012  . Rabies, intradermal 07/11/2012  . Tdap 07/11/2012    Diabetes Related Lab Review: Lab Results  Component Value Date   HGBA1C 5.9 (A) 06/19/2020   HGBA1C 6.4 (A) 02/27/2020   HGBA1C 6.2 08/01/2019    Lab Results  Component Value Date   MICROALBUR 0.8 06/19/2020   Lab Results  Component Value Date   CREATININE 0.89 06/19/2020   BUN 10 06/19/2020   NA 138 06/19/2020   K 4.5 06/19/2020   CL 105 06/19/2020   CO2 28 06/19/2020   Lab Results  Component Value Date   CHOL 136 06/19/2020   CHOL 179 08/01/2019   CHOL 153 05/31/2018   Lab Results  Component Value Date   HDL 36 (L) 06/19/2020   HDL 40.10 08/01/2019   HDL 37.20 (L) 05/31/2018   Lab Results  Component Value Date   LDLCALC 68 06/19/2020   LDLCALC 93 05/31/2018   Lab Results  Component Value Date   TRIG 229 (H) 06/19/2020   TRIG 314.0 (H) 08/01/2019   TRIG 113.0 05/31/2018   Lab Results  Component Value Date   CHOLHDL 3.8 06/19/2020   CHOLHDL 4 08/01/2019   CHOLHDL 4 05/31/2018  Lab Results  Component Value Date   LDLDIRECT 103.0 08/01/2019   The 10-year ASCVD risk score Denman George DC Montez Hageman., et al., 2013) is: 21.3%   Values used to calculate the score:     Age: 52 years     Sex: Male     Is Non-Hispanic African American: No     Diabetic: Yes     Tobacco smoker: Yes     Systolic Blood Pressure: 134 mmHg     Is BP treated: No     HDL Cholesterol: 36 mg/dL     Total Cholesterol: 136 mg/dL  BP Readings from Last 3 Encounters:  09/29/20 134/65  06/19/20 122/78  02/27/20 130/70   Wt Readings from Last 3 Encounters:  09/29/20 192 lb (87.1 kg)  06/19/20 181 lb 6.4 oz (82.3 kg)  02/27/20  183 lb (83 kg)    Health Maintenance  Topic Date Due  . OPHTHALMOLOGY EXAM  Never done  . COVID-19 Vaccine (1) Never done  . Fecal DNA (Cologuard)  Never done  . INFLUENZA VACCINE  06/08/2020  . HEMOGLOBIN A1C  12/20/2020  . FOOT EXAM  09/29/2021  . TETANUS/TDAP  07/11/2022  . PNEUMOCOCCAL POLYSACCHARIDE VACCINE AGE 20-64 HIGH RISK  Completed  . Hepatitis C Screening  Completed  . HIV Screening  Completed     Assessment  1. Annual physical exam   2. Controlled type 2 diabetes mellitus without complication, without long-term current use of insulin (HCC)   3. Combined hyperlipidemia associated with type 2 diabetes mellitus (HCC)   4. GAD (generalized anxiety disorder)   5. Major depression, chronic   6. Cervical radiculopathy due to degenerative joint disease of spine   7. Carpal tunnel syndrome on right   8. Ulnar neuropathy of right upper extremity      Plan  Male Wellness Visit:  Age appropriate Health Maintenance and Prevention measures were discussed with patient. Included topics are cancer screening recommendations, ways to keep healthy (see AVS) including dietary and exercise recommendations, regular eye and dental care, use of seat belts, and avoidance of moderate alcohol use and tobacco use.   BMI: discussed patient's BMI and encouraged positive lifestyle modifications to help get to or maintain a target BMI.  HM needs and immunizations were addressed and ordered. See below for orders. See HM and immunization section for updates.  Routine labs and screening tests ordered including cmp, cbc and lipids where appropriate.  Discussed recommendations regarding Vit D and calcium supplementation (see AVS)  Chronic disease management visit and/or acute problem visit:  Diabetes is very well controlled.  Continue Metformin.  Declines flu vaccination.  Eye exam as needed.  And recommended.  Check renal function and electrolytes.  Hyperlipidemia well-controlled on statin.   Refilled.  Mood disorder and ADD: Medications refilled for a week.  She will follow-up with psychiatry for further medication refills.  Follow-up with Ortho for severe carpal tunnel in the right ulnar neuropathy.  Also with cervical DJD and radiculopathy.  Continue with physical therapy  Smoker, albuterol as needed Follow up: 6 months for hypertension and diabetes recheck Orders Placed This Encounter  Procedures  . POCT glycosylated hemoglobin (Hb A1C)   No orders of the defined types were placed in this encounter.     Lifestyle: Body mass index is 28.35 kg/m. Wt Readings from Last 3 Encounters:  09/29/20 192 lb (87.1 kg)  06/19/20 181 lb 6.4 oz (82.3 kg)  02/27/20 183 lb (83 kg)    Patient Active Problem  List   Diagnosis Date Noted  . Combined hyperlipidemia associated with type 2 diabetes mellitus (HCC) 02/27/2020    Priority: High  . Controlled type 2 diabetes mellitus without complication, without long-term current use of insulin (HCC) 02/26/2019    Priority: High  . GAD (generalized anxiety disorder) 10/11/2017    Priority: High    Managed by psychiatry   . Allergic asthma 12/15/2016    Priority: High  . Cigarette nicotine dependence without complication 12/15/2016    Priority: High  . Major depression, chronic 12/15/2016    Priority: High    Managed by psychiatry   . Male-to-male transgender person 12/15/2016    Priority: High    Overview:  2010 transitioned. No hormone use now.    Marland Kitchen. PTSD (post-traumatic stress disorder) 12/15/2016    Priority: High  . 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 S/P left THA, AA 02/01/2017    Priority: Medium  . Cervical radiculopathy due to degenerative joint disease of spine 09/29/2020    xrays 2020, cervical spine MRI 2021; [facility]   . Carpal tunnel syndrome on right 09/29/2020    Severe by emg, 2021, [facility]   . Ulnar neuropathy of right upper extremity 09/29/2020  . Overweight (BMI 25.0-29.9)  05/25/2017   Health Maintenance  Topic Date Due  . OPHTHALMOLOGY EXAM  Never done  . COVID-19 Vaccine (1) Never done  . Fecal DNA (Cologuard)  Never done  . INFLUENZA VACCINE  06/08/2020  . HEMOGLOBIN A1C  12/20/2020  . FOOT EXAM  09/29/2021  . TETANUS/TDAP  07/11/2022  . PNEUMOCOCCAL POLYSACCHARIDE VACCINE AGE 85-64 HIGH RISK  Completed  . Hepatitis C Screening  Completed  . HIV Screening  Completed   Immunization History  Administered Date(s) Administered  . Influenza,inj,Quad PF,6+ Mos 10/13/2017, 08/01/2019  . Pneumococcal Polysaccharide-23 05/12/2017  . Rabies, IM 07/11/2012  . Rabies, intradermal 07/11/2012  . Tdap 07/11/2012   We updated and reviewed the patient's past history in detail and it is documented below. Allergies: Patient is allergic to sulfa antibiotics. Past Medical History Patient  has a past medical history of Arthritis, Asthma, Borderline diabetes, Combined hyperlipidemia associated with type 2 diabetes mellitus (HCC) (02/27/2020), DJD (degenerative joint disease) of cervical spine (09/29/2020), DVT (deep venous thrombosis) (HCC) (2009), Headache, History of kidney stones, Pneumonia (age 89 or [age]), Prediabetes (02/21/2018), Primary osteoarthritis of right hip (12/15/2016), PTSD (post-traumatic stress disorder), Puncture wound, and Seasonal allergies. 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 Family History: Patient family history includes Coronary artery disease in her father; Diabetes in an other family member; Hypertension in an other family member. Social History:  Patient  reports that she has been smoking cigarettes. She has smoked for the past 7.00 years. She has never used smokeless tobacco. She  reports current drug use. Drug: Marijuana. She reports that she does not drink alcohol.  Review of Systems: Constitutional: negative for fever or malaise Ophthalmic: negative for photophobia, double vision or loss of vision Cardiovascular: negative for chest pain, dyspnea on exertion, or new LE swelling Respiratory: negative for SOB or persistent cough Gastrointestinal: negative for abdominal pain, change in bowel habits or melena Genitourinary: negative for dysuria or gross hematuria, no abnormal uterine bleeding or disharge Musculoskeletal: negative for new gait disturbance or muscular  weakness Integumentary: negative for new or persistent rashes, no breast lumps Neurological: negative for TIA or stroke symptoms Psychiatric: negative for SI or delusions Allergic/Immunologic: negative for hives  Patient Care Team    Relationship Specialty Notifications Start End  [name], MD PCP - General Family Medicine  01/26/17   Durene Romans, MD Consulting Physician Orthopedic Surgery  02/27/20     Objective  Vitals: BP 134/65   Pulse (!) 54   Ht 5\' 9"  (1.753 m)   Wt 192 lb (87.1 kg)   SpO2 98%   BMI 28.35 kg/m  General:  Well developed, well nourished, no acute distress  Psych:  Alert and orientedx3,normal mood and affect HEENT:  Normocephalic, atraumatic, non-icteric sclera,  supple neck without adenopathy, mass or thyromegaly Cardiovascular:  Normal S1, S2, RRR without gallop, rub or murmur Respiratory:  Good breath sounds bilaterally, CTAB with normal respiratory effort Gastrointestinal: normal bowel sounds, soft, non-tender, no noted masses. No HSM MSK: no deformities, contusions. Joints are without erythema or swelling.  Skin:  Warm, no rashes or suspicious lesions noted Diabetic Foot Exam: Appearance - no lesions, ulcers or calluses Skin - no sigificant pallor or erythema Monofilament testing - sensitive bilaterally in following locations:  Right - Great toe, medial,  central, lateral ball and posterior foot intact  Left - Great toe, medial, central, lateral ball and posterior foot intact Pulses - +2 distally bilaterally    Commons side effects, risks, benefits, and alternatives for medications and treatment plan prescribed today were discussed, and the patient expressed understanding of the given instructions. Patient is instructed to call or message via MyChart if he/she has any questions or concerns regarding our treatment plan. No barriers to understanding were identified. We discussed Red Flag symptoms and signs in detail. Patient expressed understanding regarding what to do in case of urgent or emergency type symptoms.   Medication list was reconciled, printed and provided to the patient in AVS. Patient instructions and summary information was reviewed with the patient as documented in the AVS. This note was prepared with assistance of Dragon voice recognition software. Occasional wrong-word or sound-a-like substitutions may have occurred due to the inherent limitations of voice recognition software  This visit occurred during the SARS-CoV-2 public health emergency.  Safety protocols were in place, including screening questions prior to the visit, additional usage of staff PPE, and extensive cleaning of exam room while observing appropriate contact time as indicated for disinfecting solutions.

## 2020-09-29 NOTE — Patient Instructions (Signed)
Please return in 3 months for diabetes follow up  Please return the cologuard test for colon cancer screening.   I have refilled your medications.   If you have any questions or concerns, please don't hesitate to send me a message via MyChart or call the office at [phone]. Thank you for visiting with [place] today! It's our pleasure caring for you.

## 2020-09-30 LAB — POCT GLYCOSYLATED HEMOGLOBIN (HGB A1C): Hemoglobin A1C: 6 % — AB (ref 4.0–5.6)

## 2020-12-17 ENCOUNTER — Telehealth: Payer: Self-pay | Admitting: Family Medicine

## 2020-12-17 NOTE — Telephone Encounter (Signed)
MAILBOX FULL. Pt due to schedule Medicare Annual Wellness Visit (AWV) either virtually OR in office.   No hx; please schedule at anytime with [facility]-Nurse Health Advisor at [facility].  This should be a 45 minute visit.

## 2020-12-31 ENCOUNTER — Ambulatory Visit: Payer: Medicare Other | Admitting: Family Medicine

## 2020-12-31 DIAGNOSIS — Z0289 Encounter for other administrative examinations: Secondary | ICD-10-CM

## 2021-01-20 ENCOUNTER — Encounter: Payer: Self-pay | Admitting: Family Medicine

## 2021-03-27 ENCOUNTER — Telehealth: Payer: Self-pay

## 2021-03-27 NOTE — Telephone Encounter (Signed)
FYI  Patient has been scheduled.

## 2021-03-27 NOTE — Telephone Encounter (Signed)
Patient is on the schedule for Monday.   Nurse Assessment Nurse: Tooten, RN, Irene Shipper Date/Time (Eastern Time): 03/26/2021 5:12:07 PM Confirm and document reason for call. If symptomatic, describe symptoms. ---Caller states he has an apt is for Monday. Is having trouble breathing, thinks it could be related to visible mold in his home and visiting his mom every month who has a lot of cats. Uses his inhaler twice every 4 hours but still feels weird, gets dizzy as well. Also having weakness and no energy. Unsure when sx started Does the patient have any new or worsening symptoms? ---Yes Will a triage be completed? ---Yes Related visit to physician within the last 2 weeks? ---No Does the PT have any chronic conditions? (i.e. diabetes, asthma, this includes High risk factors for pregnancy, etc.) ---Yes List chronic conditions. ---asthma, borderline diabetic Is this a behavioral health or substance abuse call? ---No PLEASE NOTE: All timestamps contained within this report are represented as [place] Standard Time. CONFIDENTIALTY NOTICE: This fax transmission is intended only for the addressee. It contains information that is legally privileged, confidential or otherwise protected from use or disclosure. If you are not the intended recipient, you are strictly prohibited from reviewing, disclosing, copying using or disseminating any of this information or taking any action in reliance on or regarding this information. If you have received this fax in error, please notify us immediately by telephone so that we can arrange for its return to [place]. Phone: [phone], Toll-Free: [phone], Fax: [phone] Page: 2 of 2 Call Id: [id number] Guidelines Guideline Title Affirmed Question Affirmed Notes Nurse Date/Time Lamount Cohen Time) Breathing Difficulty History of prior "blood clot" in leg or lungs (i.e., deep vein thrombosis, pulmonary embolism) Tooten, RN, Jamese 03/26/2021 5:15:02 PM Disp.  Time Lamount Cohen Time) Disposition Final User 03/26/2021 5:09:12 PM Send to Urgent Queue Schlegelmilch, Fleet Contras 03/26/2021 5:27:24 PM Go to ED Now Yes Tooten, RN, Stark Bray Disagree/Comply Disagree Caller Understands Yes PreDisposition Did not know what to do Care Advice Given Per Guideline GO TO ED NOW: * You need to be seen in the Emergency Department. * Go to the ED at ___________ Hospital. * Leave now. Drive carefully. CALL EMS 911 IF: * Call EMS if you become worse. CARE ADVICE given per Breathing Difficulty (Adult) guideline. Comments User: Denny Levy, RN Date/Time Lamount Cohen Time): 03/26/2021 5:27:19 PM Caller refuses to go to ED due to prior bad experience at the hospital. Will wait for his appt on Monday unless he starts feeling worse. Referrals GO TO FACILITY UNDECIDED

## 2021-03-30 ENCOUNTER — Ambulatory Visit: Payer: Medicare (Managed Care) | Admitting: Family Medicine

## 2021-04-14 ENCOUNTER — Other Ambulatory Visit: Payer: Self-pay

## 2021-04-14 ENCOUNTER — Encounter: Payer: Self-pay | Admitting: Family

## 2021-04-14 ENCOUNTER — Ambulatory Visit (INDEPENDENT_AMBULATORY_CARE_PROVIDER_SITE_OTHER): Payer: Medicare (Managed Care) | Admitting: Family

## 2021-04-14 VITALS — BP 140/80 | HR 65 | Temp 98.5°F | Ht 69.0 in | Wt 181.2 lb

## 2021-04-14 DIAGNOSIS — K047 Periapical abscess without sinus: Secondary | ICD-10-CM

## 2021-04-14 DIAGNOSIS — K029 Dental caries, unspecified: Secondary | ICD-10-CM | POA: Diagnosis not present

## 2021-04-14 MED ORDER — PENICILLIN V POTASSIUM 500 MG PO TABS: 500.0000 mg | ORAL_TABLET | Freq: Four times a day (QID) | ORAL | 0 refills | Status: AC

## 2021-04-14 NOTE — Patient Instructions (Signed)
Call and schedule an appointment with your dentist as soon as possible.   Dental Caries, Adult  Dental caries are spots of decay (cavities) in teeth. They are in the outer layer of your tooth (enamel). Treat them as soon as you can. If they are not treated, they can spread decay and lead to painful infection. What are the causes? This condition is caused by acid that is produced when bacteria in your mouth break down sugary foods and liquids. What increases the risk? This condition is more likely to develop in people who:  Drink a lot of sugary liquids.  Eat a lot of sweets and carbohydrates.  Drink water that does not have fluoride.  Do not clean their teeth regularly.  Take medicines that decrease saliva. What are the signs or symptoms? Symptoms of this condition include:  White, brown, or black spots on the teeth.  Pain as the decay goes deeper into the tooth.  Swelling or bleeding in the gums. How is this treated? This condition is treated with a procedure to remove decay and to restore the tooth. Follow these instructions at home:  Take good care of your mouth and teeth. This keeps them healthy. ? Brush your teeth 2 times a day. Use toothpaste with fluoride in it. ? Floss your teeth once a day.  If your dentist prescribed an antibiotic medicine, take it as told. Do not stop taking the antibiotic even if your condition gets better.  Keep all follow-up visits as told by your dentist. This is important. This includes all cleanings. How is this prevented?  To prevent dental caries: ? Brush your teeth every morning and night. Use fluoride toothpaste. ? Floss your teeth once a day. ? Get regular dental cleanings. ? If told by your dentist:  Wash your mouth with prescription mouthwash (chlorhexidine).  Put topical fluoride on your teeth. ? Drink water with fluoride in it. ? Drink water instead of sugary drinks. ? Eat healthy meals and snacks. ? Have fluoride  treatments in the dentist's office and sealants put on your teeth, if told by your dentist. Contact a doctor if:  You have symptoms of tooth decay. Summary  Dental caries are spots of decay (cavities) in teeth. It is important to treat this as soon as you see them.  This condition is caused by an acid that comes when sugar breaks down in your mouth.  To prevent this condition, brush your teeth often and have regular dental cleanings.  Take an antibiotic to treat an infection, if told by your dentist. Do not stop taking the antibiotic even if your condition gets better.  Have regular dental cleanings and keep all follow-up visits. This information is not intended to replace advice given to you by your health care provider. Make sure you discuss any questions you have with your health care provider. Document Revised: 10/12/2019 Document Reviewed: 10/12/2019 Elsevier Patient Education  2021 [employer].

## 2021-04-14 NOTE — Progress Notes (Signed)
Acute Office Visit  Subjective:    Patient ID: Corey Collins, adult    DOB: 16-Oct-1961, 60 y.o.   MRN: [id number]  Chief Complaint  Patient presents with   Facial Pain   Ear Pain    HPI Patient is in today with c/o facial pain with intermittent swelling on the right side off and on, dental decay,for a few months. Patient reports she has been using Listerine, which helps temporarily. Reports having 3 bad teeth on the right side and 2 on the left (lower). Has not consulted with a dentist yet but plans to. No fever or chills.   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   xrays 2020   DVT (deep venous thrombosis) (HCC) 2009   L calf   Headache    sinus   History of kidney stones    Pneumonia age 58 or 29   Prediabetes 02/21/2018   On metformin   Primary osteoarthritis of right hip 12/15/2016   PTSD (post-traumatic stress disorder)    Puncture wound    2010 left lower leg    Seasonal allergies     Past Surgical History:  Procedure Laterality Date   APPENDECTOMY     BOWEL RESECTION  02/04/2018   Procedure: SMALL BOWEL RESECTION;  Surgeon: Berna Bue, MD;  Location: [facility];  Service: General;;   HERNIA REPAIR     INGUINAL HERNIA REPAIR Right 02/04/2018   Procedure: HERNIA REPAIR INGUINAL ADULT;  Surgeon: Berna Bue, MD;  Location: [facility];  Service: General;  Laterality: Right;   LAPAROTOMY  02/04/2018   Procedure: EXPLORATORY LAPAROTOMY;  Surgeon: Berna Bue, MD;  Location: [facility];  Service: General;;   NASAL FRACTURE SURGERY     TOTAL HIP ARTHROPLASTY Left 02/01/2017   Procedure: LEFT TOTAL HIP ARTHROPLASTY ANTERIOR APPROACH;  Surgeon: Durene Romans, MD;  Location: [facility];  Service: Orthopedics;  Laterality: Left;   TOTAL HIP ARTHROPLASTY Right  05/24/2017   Procedure: RIGHT TOTAL HIP ARTHROPLASTY ANTERIOR APPROACH;  Surgeon: Durene Romans, MD;  Location: [facility];  Service: Orthopedics;  Laterality: Right;    Family History  Problem Relation Age of Onset   Coronary artery disease Father    Hypertension Other    Diabetes Other     Social History   Socioeconomic History   Marital status: Divorced    Spouse name: Not on file   Number of children: Not on file   Years of education: Not on file   Highest education level: Not on file  Occupational History   Occupation: disabled due to PTSD, former professor history  Tobacco Use   Smoking status: Current Some Day Smoker    Years: 7.00    Types: Cigarettes    Last attempt to quit: 01/06/2017    Years since quitting: 4.2   Smokeless tobacco: Never Used   Tobacco comment: down to a couple cigs per down   Vaping Use   Vaping Use: Never used  Substance and Sexual Activity   Alcohol use: No   Drug use: Yes    Types: Marijuana    Comment: occ marijuana use, last used 2 weeks ago   Sexual activity: Yes    Birth control/protection: None  Other Topics Concern  Not on file  Social History Narrative   Not on file   Social Determinants of Health   Financial Resource Strain: Not on file  Food Insecurity: Not on file  Transportation Needs: Not on file  Physical Activity: Not on file  Stress: Not on file  Social Connections: Not on file  Intimate Partner Violence: Not on file    Outpatient Medications Prior to Visit  Medication Sig Dispense Refill   albuterol (VENTOLIN HFA) 108 (90 Base) MCG/ACT inhaler Inhale 2 puffs into the lungs every 4 (four) hours as needed for wheezing or shortness of breath. 18 g 5   ALPRAZolam (XANAX) 0.5 MG tablet Take 1 tablet (0.5 mg total) by mouth daily. 30 tablet 0   amitriptyline (ELAVIL) 50 MG tablet Take 50 mg by mouth at bedtime as needed for sleep.     buPROPion (WELLBUTRIN XL) 150 MG 24 hr tablet Take 1 tablet by mouth every morning.      Cholecalciferol (VITAMIN D3 GUMMIES ADULT PO) Take 2 each by mouth daily.     Coenzyme Q10 (COQ-10) 50 MG CAPS Take 50 mg by mouth daily.      ketoconazole (NIZORAL) 2 % shampoo Apply 1 application topically 2 (two) times a week. 120 mL 1   lithium carbonate 300 MG capsule Take 1 capsule (300 mg total) by mouth 3 (three) times daily. 90 capsule 3   metFORMIN (GLUCOPHAGE) 500 MG tablet Take 1 tablet (500 mg total) by mouth 2 (two) times daily with a meal. 180 tablet 3   methylphenidate (RITALIN) 10 MG tablet Take 2-3 tablets (20-30 mg total) by mouth daily. 15 tablet 0   Omega-3 Fatty Acids (FISH OIL) 1000 MG CAPS Take 1,000 mg by mouth daily.      polyethylene glycol powder (GLYCOLAX/MIRALAX) 17 GM/SCOOP powder Take 17 g by mouth 2 (two) times daily as needed. 850 g 1   prazosin (MINIPRESS) 1 MG capsule TAKE ONE CAPSULE BY MOUTH TWICE DAILY FOR 30 DAYS  0   Prenatal Vit-Min-FA-Fish Oil ([employer] PRENATAL GUMMY PO) Take 2 tablets by mouth daily.     simvastatin (ZOCOR) 20 MG tablet Take 1 tablet (20 mg total) by mouth at bedtime. 90 tablet 3   triamcinolone cream (KENALOG) 0.1 % Apply 1 application topically 2 (two) times daily. For 2 weeks, then as needed 45 g 0   PARoxetine (PAXIL) 20 MG tablet Take 1 tablet (20 mg total) by mouth daily. (Patient not taking: Reported on 04/14/2021) 30 tablet 0   No facility-administered medications prior to visit.    Allergies  Allergen Reactions   Sulfa Antibiotics Other (See Comments)    Family history, took in [place] in [id number] did ok with    Review of Systems  Constitutional: Negative.  Negative for fever.  HENT:  Positive for facial swelling.        Dental decay with facial pain/swelling intermittently  Respiratory: Negative.    Cardiovascular: Negative.   Allergic/Immunologic: Negative.   Neurological: Negative.   Psychiatric/Behavioral: Negative.        Objective:    Physical Exam Vitals and nursing note reviewed.  Constitutional:       Appearance: Normal appearance.  HENT:     Mouth/Throat:     Dentition: Abnormal dentition. Dental tenderness, gingival swelling and dental caries present.     Comments: Significant dental decay throughout the entire mouth.  Cardiovascular:     Rate and Rhythm: Normal rate and regular rhythm.  Pulmonary:  Effort: Pulmonary effort is normal.     Breath sounds: Normal breath sounds.  Musculoskeletal:        General: Normal range of motion.     Cervical back: Normal range of motion and neck supple.  Neurological:     Mental Status: She is alert.  Psychiatric:        Mood and Affect: Mood normal.    BP 140/80   Pulse 65   Temp 98.5 F (36.9 C) (Temporal)   Ht 5\' 9"  (1.753 m)   Wt 181 lb 3.2 oz (82.2 kg)   SpO2 97%   BMI 26.76 kg/m  Wt Readings from Last 3 Encounters:  04/14/21 181 lb 3.2 oz (82.2 kg)  09/29/20 192 lb (87.1 kg)  06/19/20 181 lb 6.4 oz (82.3 kg)    Health Maintenance Due  Topic Date Due   Pneumococcal Vaccine 48-2 Years old (1 of 4 - PCV13) Never done   OPHTHALMOLOGY EXAM  Never done   Fecal DNA (Cologuard)  Never done   Zoster Vaccines- Shingrix (1 of 2) Never done   HEMOGLOBIN A1C  03/30/2021    There are no preventive care reminders to display for this patient.   Lab Results  Component Value Date   TSH 1.28 08/01/2019   Lab Results  Component Value Date   WBC 8.3 06/19/2020   HGB 14.5 06/19/2020   HCT 43.4 06/19/2020   MCV 92.9 06/19/2020   PLT 276 06/19/2020   Lab Results  Component Value Date   NA 138 06/19/2020   K 4.5 06/19/2020   CO2 28 06/19/2020   GLUCOSE 154 (H) 06/19/2020   BUN 10 06/19/2020   CREATININE 0.89 06/19/2020   BILITOT 0.3 06/19/2020   ALKPHOS 44 08/01/2019   AST 14 06/19/2020   ALT 17 06/19/2020   PROT 6.6 06/19/2020   ALBUMIN 4.6 08/01/2019   CALCIUM 9.9 06/19/2020   ANIONGAP 7 02/06/2018   GFR 122.11 08/01/2019   Lab Results  Component Value Date   CHOL 136 06/19/2020   Lab Results  Component Value  Date   HDL 36 (L) 06/19/2020   Lab Results  Component Value Date   LDLCALC 68 06/19/2020   Lab Results  Component Value Date   TRIG 229 (H) 06/19/2020   Lab Results  Component Value Date   CHOLHDL 3.8 06/19/2020   Lab Results  Component Value Date   HGBA1C 6.0 (A) 09/30/2020       Assessment & Plan:   Problem List Items Addressed This Visit   None    Visit Diagnoses     Dental caries    -  Primary   Dental abscess            Meds ordered this encounter  Medications   penicillin v potassium (VEETID) 500 MG tablet    Sig: Take 1 tablet (500 mg total) by mouth 4 (four) times daily for 7 days.    Dispense:  28 tablet    Refill:  0   Advised patient to see a dentist ASAP for likely extractions. Call if symptoms worsen or persist.   10/02/2020, FNP

## 2021-06-17 ENCOUNTER — Other Ambulatory Visit: Payer: Self-pay | Admitting: Orthopedic Surgery

## 2021-06-17 DIAGNOSIS — M542 Cervicalgia: Secondary | ICD-10-CM

## 2021-07-20 ENCOUNTER — Other Ambulatory Visit: Payer: Self-pay

## 2021-07-20 ENCOUNTER — Telehealth: Payer: Self-pay

## 2021-07-20 MED ORDER — ALBUTEROL SULFATE HFA 108 (90 BASE) MCG/ACT IN AERS: 2.0000 | INHALATION_SPRAY | RESPIRATORY_TRACT | 5 refills | Status: DC | PRN

## 2021-07-20 NOTE — Telephone Encounter (Signed)
Medication has been sent in 

## 2021-07-20 NOTE — Telephone Encounter (Signed)
LAST APPOINTMENT DATE:  04/14/21  NEXT APPOINTMENT DATE: 09/30/21  MEDICATION:albuterol (VENTOLIN HFA) 108 (90 Base) MCG/ACT inhaler  [facility]ACY: [facility] [place]

## 2021-09-11 ENCOUNTER — Telehealth: Payer: Self-pay

## 2021-09-11 NOTE — Telephone Encounter (Signed)
MEDICATION: albuterol (VENTOLIN HFA) 108 (90 Base) MCG/ACT inhaler  PHARMACY:  [facility] [id number] [place], [place] - [phone] Phone:  [phone]  Fax:  [phone]      Comments:   **Let patient know to contact pharmacy at the end of the day to make sure medication is ready. **  ** Please notify patient to allow 48-72 hours to process**  **Encourage patient to contact the pharmacy for refills or they can request refills through [facility]**

## 2021-09-14 ENCOUNTER — Other Ambulatory Visit: Payer: Self-pay

## 2021-09-14 MED ORDER — ALBUTEROL SULFATE HFA 108 (90 BASE) MCG/ACT IN AERS: 2.0000 | INHALATION_SPRAY | RESPIRATORY_TRACT | 5 refills | Status: DC | PRN

## 2021-09-14 NOTE — Telephone Encounter (Signed)
Medication has been filled 

## 2021-09-30 ENCOUNTER — Encounter: Payer: Medicare (Managed Care) | Admitting: Family Medicine

## 2021-10-10 ENCOUNTER — Ambulatory Visit
Admission: RE | Admit: 2021-10-10 | Discharge: 2021-10-10 | Disposition: A | Discharge status: Home / Self Care | Payer: Medicare (Managed Care) | Source: Ambulatory Visit | Attending: Orthopedic Surgery | Admitting: Orthopedic Surgery

## 2021-10-10 DIAGNOSIS — M542 Cervicalgia: Secondary | ICD-10-CM

## 2021-11-19 ENCOUNTER — Encounter: Payer: Medicare (Managed Care) | Admitting: Family Medicine

## 2022-01-11 ENCOUNTER — Telehealth: Payer: Self-pay

## 2022-01-11 ENCOUNTER — Other Ambulatory Visit: Payer: Self-pay

## 2022-01-11 MED ORDER — KETOCONAZOLE 2 % EX SHAM: 1.0000 "application " | MEDICATED_SHAMPOO | CUTANEOUS | 1 refills | Status: DC

## 2022-01-11 MED ORDER — TRIAMCINOLONE ACETONIDE 0.1 % EX CREA: 1.0000 "application " | TOPICAL_CREAM | Freq: Two times a day (BID) | CUTANEOUS | 0 refills | Status: DC

## 2022-01-11 NOTE — Telephone Encounter (Signed)
Sent to pharmacy 

## 2022-01-11 NOTE — Telephone Encounter (Signed)
MEDICATION: triamcinolone cream (KENALOG) 0.1 % ?ketoconazole (NIZORAL) 2 % shampoo ? ? ?PHARMACY:  ?[facility] [id number] [place], [place] - [id number] Phone:  [phone]  ?Fax:  [phone]  ?  ? ? ?Comments: Patient states she is completely out ? ?**Let patient know to contact pharmacy at the end of the day to make sure medication is ready. ** ? ?** Please notify patient to allow 48-72 hours to process** ? ?**Encourage patient to contact the pharmacy for refills or they can request refills through [facility]** ? ?

## 2022-01-28 ENCOUNTER — Telehealth: Payer: Self-pay | Admitting: Family Medicine

## 2022-01-28 NOTE — Telephone Encounter (Signed)
.. ?  Encourage patient to contact the pharmacy for refills or they can request refills through [facility] ? ?LAST APPOINTMENT DATE:  09/29/20 ? ?NEXT APPOINTMENT DATE: 02/03/22 ? ?MEDICATION:metFORMIN (GLUCOPHAGE) 500 MG tablet ? ?albuterol (VENTOLIN HFA) 108 (90 Base) MCG/ACT inhaler ? ?Is the patient out of medication? yes ? ?PHARMACY: [facility], [address], [place], [place] [address] ? ?Let patient know to contact pharmacy at the end of the day to make sure medication is ready. ? ?Please notify patient to allow 48-72 hours to process  ?

## 2022-01-29 ENCOUNTER — Other Ambulatory Visit: Payer: Self-pay

## 2022-01-29 DIAGNOSIS — E119 Type 2 diabetes mellitus without complications: Secondary | ICD-10-CM

## 2022-01-29 NOTE — Telephone Encounter (Signed)
Spoke with pt and he stated that he will be in 02/03/2022 ?

## 2022-02-03 ENCOUNTER — Ambulatory Visit (INDEPENDENT_AMBULATORY_CARE_PROVIDER_SITE_OTHER): Payer: Medicare (Managed Care) | Admitting: Family Medicine

## 2022-02-03 ENCOUNTER — Encounter: Payer: Self-pay | Admitting: Family Medicine

## 2022-02-03 VITALS — BP 151/83 | HR 70 | Temp 98.6°F | Ht 69.0 in | Wt 196.4 lb

## 2022-02-03 DIAGNOSIS — E782 Mixed hyperlipidemia: Secondary | ICD-10-CM

## 2022-02-03 DIAGNOSIS — F17210 Nicotine dependence, cigarettes, uncomplicated: Secondary | ICD-10-CM | POA: Diagnosis not present

## 2022-02-03 DIAGNOSIS — E1169 Type 2 diabetes mellitus with other specified complication: Secondary | ICD-10-CM | POA: Diagnosis not present

## 2022-02-03 DIAGNOSIS — F411 Generalized anxiety disorder: Secondary | ICD-10-CM

## 2022-02-03 DIAGNOSIS — M4722 Other spondylosis with radiculopathy, cervical region: Secondary | ICD-10-CM

## 2022-02-03 DIAGNOSIS — E119 Type 2 diabetes mellitus without complications: Secondary | ICD-10-CM

## 2022-02-03 DIAGNOSIS — Z01818 Encounter for other preprocedural examination: Secondary | ICD-10-CM

## 2022-02-03 DIAGNOSIS — F329 Major depressive disorder, single episode, unspecified: Secondary | ICD-10-CM

## 2022-02-03 DIAGNOSIS — G5621 Lesion of ulnar nerve, right upper limb: Secondary | ICD-10-CM

## 2022-02-03 LAB — POCT GLYCOSYLATED HEMOGLOBIN (HGB A1C): Hemoglobin A1C: 7.8 % — AB (ref 4.0–5.6)

## 2022-02-03 MED ORDER — BUPROPION HCL ER (XL) 150 MG PO TB24: 150.0000 mg | ORAL_TABLET | Freq: Every morning | ORAL | 0 refills | Status: DC

## 2022-02-03 MED ORDER — SIMVASTATIN 20 MG PO TABS: 20.0000 mg | ORAL_TABLET | Freq: Every day | ORAL | 3 refills | Status: DC

## 2022-02-03 MED ORDER — PRAZOSIN HCL 1 MG PO CAPS: 1.0000 mg | ORAL_CAPSULE | Freq: Every day | ORAL | 0 refills | Status: DC

## 2022-02-03 MED ORDER — METFORMIN HCL 500 MG PO TABS: 500.0000 mg | ORAL_TABLET | Freq: Two times a day (BID) | ORAL | 3 refills | Status: DC

## 2022-02-03 MED ORDER — ALPRAZOLAM 0.5 MG PO TABS: 0.5000 mg | ORAL_TABLET | Freq: Every evening | ORAL | 0 refills | Status: AC | PRN

## 2022-02-03 MED ORDER — AMITRIPTYLINE HCL 50 MG PO TABS: 50.0000 mg | ORAL_TABLET | Freq: Every evening | ORAL | 0 refills | Status: DC | PRN

## 2022-02-03 NOTE — Patient Instructions (Signed)
Please return in 3 months for diabetes follow up  ? ?I will send in your clearance form once the results return.  ? ?Please restart the medications as ordered.  ?Please seek care from a psychiatrist again. I have refilled meds for 3 months.  ? ?Restart metformin and simvastatin.  ? ?If you have any questions or concerns, please don't hesitate to send me a message via MyChart or call the office at [phone]. Thank you for visiting with [place] today! It's our pleasure caring for you.  ?

## 2022-02-03 NOTE — Progress Notes (Signed)
? ?Subjective  ?CC:  ?Chief Complaint  ?Patient presents with  ? Depression  ?  Pt stated that he does not have any of his medicatnios for his Anx/Dep. Also needs to get surgery clearance.  ? Anxiety  ? Diabetes  ? ? ?HPI: Corey Collins is a 61 y.o. adult who presents to the office today for follow up of diabetes and problems listed above in the chief complaint.  I last saw her 18 months ago. ?Diabetes follow up: Her diabetic control is reported as Worse.  Corey Collins has been out of metformin for about 6 weeks Corey Collins says.  Corey Collins denies exertional CP or SOB or symptomatic hypoglycemia. Corey Collins denies foot sores or paresthesias.  ?Mood disorder: Corey Collins is out of all of her psychiatric medications.  Not currently seeing a psychiatrist.  Reports that Corey Collins had a hospitalization for suicidal gesture last summer.  Medications were adjusted at that time.  Corey Collins reports Corey Collins is extremely stressed due to living with her mother.  Corey Collins is requesting psychiatric medications. ?Scheduled for cervical spinal fusion due to herniated disc: Needs medical clearance.  Risk factors include chronic smoker, likely COPD, risk factors for heart disease.  Also with diabetes not currently treated. ?Hyperlipidemia: Out of statin. ?Health maintenance: Overdue for screenings ?Blood pressure is elevated today.  Likely due to anxiety and stressors.  No history of hypertension.  Weight is up. ? ? ?Wt Readings from Last 3 Encounters:  ?02/03/22 196 lb 6.4 oz (89.1 kg)  ?04/14/21 181 lb 3.2 oz (82.2 kg)  ?09/29/20 192 lb (87.1 kg)  ? ? ?BP Readings from Last 3 Encounters:  ?02/03/22 (!) 151/83  ?04/14/21 140/80  ?09/29/20 134/65  ? ? ?Assessment  ?1. Controlled type 2 diabetes mellitus without complication, without long-term current use of insulin (HCC)   ?2. Combined hyperlipidemia associated with type 2 diabetes mellitus (HCC)   ?3. Cigarette nicotine dependence without complication   ?4. Preoperative clearance   ?5. Cervical radiculopathy due to degenerative joint  disease of spine   ?6. Ulnar neuropathy of right upper extremity   ?7. GAD (generalized anxiety disorder)   ?8. Major depression, chronic   ? ?  ?Plan  ?Diabetes is currently marginally controlled.  Will restart metformin 500 twice daily and recheck in 3 months. ?Smoking cessation recommended but patient is precontemplative due to stressors and anxiety ?Mood disorder: Recommend psychiatry follow-up.  In the interim refilled mood medications. ?Hyperlipidemia: Nonfasting and we will recheck today. ?Preoperative clearance: Moderate risk.  No symptoms of heart disease.  We will await lab work. ? ?Follow up: 3 months to recheck diabetes.  Blood ?Orders Placed This Encounter  ?Procedures  ? CBC with Differential/Platelet  ? Comprehensive metabolic panel  ? Lipid Profile  ? Microalbumin / creatinine urine ratio  ? POCT glycosylated hemoglobin (Hb A1C)  ? ?Meds ordered this encounter  ?Medications  ? metFORMIN (GLUCOPHAGE) 500 MG tablet  ?  Sig: Take 1 tablet (500 mg total) by mouth 2 (two) times daily with a meal.  ?  Dispense:  180 tablet  ?  Refill:  3  ? prazosin (MINIPRESS) 1 MG capsule  ?  Sig: Take 1 capsule (1 mg total) by mouth at bedtime.  ?  Dispense:  90 capsule  ?  Refill:  0  ? simvastatin (ZOCOR) 20 MG tablet  ?  Sig: Take 1 tablet (20 mg total) by mouth at bedtime.  ?  Dispense:  90 tablet  ?  Refill:  3  ?  buPROPion (WELLBUTRIN XL) 150 MG 24 hr tablet  ?  Sig: Take 1 tablet (150 mg total) by mouth every morning.  ?  Dispense:  90 tablet  ?  Refill:  0  ? amitriptyline (ELAVIL) 50 MG tablet  ?  Sig: Take 1 tablet (50 mg total) by mouth at bedtime as needed for sleep.  ?  Dispense:  90 tablet  ?  Refill:  0  ? ALPRAZolam (XANAX) 0.5 MG tablet  ?  Sig: Take 1 tablet (0.5 mg total) by mouth at bedtime as needed for anxiety.  ?  Dispense:  20 tablet  ?  Refill:  0  ? ?  ? ?Immunization History  ?Administered Date(s) Administered  ? Influenza,inj,Quad PF,6+ Mos 10/13/2017, 08/01/2019  ? Pneumococcal  Polysaccharide-23 05/12/2017  ? Rabies, IM 07/11/2012  ? Rabies, intradermal 07/11/2012  ? Tdap 07/11/2012  ? ? ?Diabetes Related Lab Review: ?Lab Results  ?Component Value Date  ? HGBA1C 7.8 (A) 02/03/2022  ? HGBA1C 6.0 (A) 09/30/2020  ? HGBA1C 5.9 (A) 06/19/2020  ?  ?Lab Results  ?Component Value Date  ? MICROALBUR 0.8 06/19/2020  ? ?Lab Results  ?Component Value Date  ? CREATININE 0.89 06/19/2020  ? BUN 10 06/19/2020  ? NA 138 06/19/2020  ? K 4.5 06/19/2020  ? CL 105 06/19/2020  ? CO2 28 06/19/2020  ? ?Lab Results  ?Component Value Date  ? CHOL 136 06/19/2020  ? CHOL 179 08/01/2019  ? CHOL 153 05/31/2018  ? ?Lab Results  ?Component Value Date  ? HDL 36 (L) 06/19/2020  ? HDL 40.10 08/01/2019  ? HDL 37.20 (L) 05/31/2018  ? ?Lab Results  ?Component Value Date  ? LDLCALC 68 06/19/2020  ? LDLCALC 93 05/31/2018  ? ?Lab Results  ?Component Value Date  ? TRIG 229 (H) 06/19/2020  ? TRIG 314.0 (H) 08/01/2019  ? TRIG 113.0 05/31/2018  ? ?Lab Results  ?Component Value Date  ? CHOLHDL 3.8 06/19/2020  ? CHOLHDL 4 08/01/2019  ? CHOLHDL 4 05/31/2018  ? ?Lab Results  ?Component Value Date  ? LDLDIRECT 103.0 08/01/2019  ? ?The 10-year ASCVD risk score (Arnett DK, et al., 2019) is: 27.1% ?  Values used to calculate the score: ?    Age: 35 years ?    Sex: Male ?    Is Non-Hispanic African American: No ?    Diabetic: Yes ?    Tobacco smoker: Yes ?    Systolic Blood Pressure: 151 mmHg ?    Is BP treated: No ?    HDL Cholesterol: 36 mg/dL ?    Total Cholesterol: 136 mg/dL ?I have reviewed the PMH, Fam and Soc history. ?Patient Active Problem List  ? Diagnosis Date Noted  ? Combined hyperlipidemia associated with type 2 diabetes mellitus (HCC) 02/27/2020  ?  Priority: High  ? Controlled type 2 diabetes mellitus without complication, without long-term current use of insulin (HCC) 02/26/2019  ?  Priority: High  ? GAD (generalized anxiety disorder) 10/11/2017  ?  Priority: High  ?  Managed by psychiatry ?  ? Allergic asthma 12/15/2016  ?   Priority: High  ? Cigarette nicotine dependence without complication 12/15/2016  ?  Priority: High  ? Major depression, chronic 12/15/2016  ?  Priority: High  ?  Managed by psychiatry ?  ? Male-to-male transgender person 12/15/2016  ?  Priority: High  ?  Overview:  ?2010 transitioned. No hormone use now.  ?  ? PTSD (post-traumatic stress disorder) 12/15/2016  ?  Priority: High  ? S/P right THA, AA 05/24/2017  ?  Priority: Medium   ? S/P left THA, AA 02/01/2017  ?  Priority: Medium   ? Cervical radiculopathy due to degenerative joint disease of spine 09/29/2020  ?  xrays 2020, cervical spine MRI 2021; [facility] ?  ? Carpal tunnel syndrome on right 09/29/2020  ?  Severe by emg, 2021, [facility] ?  ? Ulnar neuropathy of right upper extremity 09/29/2020  ? Overweight (BMI 25.0-29.9) 05/25/2017  ? ? ?Social History: ?Patient  reports that Corey Collins has been smoking cigarettes. Corey Collins has never used smokeless tobacco. Corey Collins reports current drug use. Drug: Marijuana. Corey Collins reports that Corey Collins does not drink alcohol. ? ?Review of Systems: ?Ophthalmic: negative for eye pain, loss of vision or double vision ?Cardiovascular: negative for chest pain ?Respiratory: negative for SOB or persistent cough ?Gastrointestinal: negative for abdominal pain ?Genitourinary: negative for dysuria or gross hematuria ?MSK: negative for foot lesions ?Neurologic: negative for weakness or gait disturbance ? ?Objective  ?Vitals: BP (!) 151/83   Pulse 70   Temp 98.6 ?F (37 ?C)   Ht 5\' 9"  (1.753 m)   Wt 196 lb 6.4 oz (89.1 kg)   SpO2 97%   BMI 29.00 kg/m?  ?General: well appearing, no acute distress, disheveled ?HEENT:  Normocephalic, atraumatic, moist mucous membranes, supple neck  ?Cardiovascular:  Nl S1 and S2, RRR without murmur, gallop or rub.  Trace bilateral edema ?Respiratory:  Good breath sounds bilaterally, CTAB with normal effort, no rales, no wheezing ?Foot exam: no erythema, pallor, or cyanosis visible nl proprioception and sensation to  monofilament testing bilaterally, +2 distal pulses bilaterally ? ? ? ?Diabetic education: ongoing education regarding chronic disease management for diabetes was given today. We continue to reinforce the ABC's of diabetic

## 2022-02-04 LAB — LIPID PANEL
Cholesterol: 106 mg/dL (ref 0–200)
HDL: 32.6 mg/dL — ABNORMAL LOW (ref >39.00)
LDL Cholesterol: 56 mg/dL (ref 0–99)
NonHDL: 73.01
Total CHOL/HDL Ratio: 3
Triglycerides: 87 mg/dL (ref 0.0–149.0)
VLDL: 17.4 mg/dL (ref 0.0–40.0)

## 2022-02-04 LAB — CBC WITH DIFFERENTIAL/PLATELET
Basophils Absolute: 0.1 10*3/uL (ref 0.0–0.1)
Basophils Relative: 1.5 % (ref 0.0–3.0)
Eosinophils Absolute: 0.2 10*3/uL (ref 0.0–0.7)
Eosinophils Relative: 3.3 % (ref 0.0–5.0)
HCT: 41.9 % (ref 39.0–52.0)
Hemoglobin: 14.1 g/dL (ref 13.0–17.0)
Lymphocytes Relative: 23.2 % (ref 12.0–46.0)
Lymphs Abs: 1.5 10*3/uL (ref 0.7–4.0)
MCHC: 33.6 g/dL (ref 30.0–36.0)
MCV: 90.2 fl (ref 78.0–100.0)
Monocytes Absolute: 0.6 10*3/uL (ref 0.1–1.0)
Monocytes Relative: 9.7 % (ref 3.0–12.0)
Neutro Abs: 4 10*3/uL (ref 1.4–7.7)
Neutrophils Relative %: 62.3 % (ref 43.0–77.0)
Platelets: 274 10*3/uL (ref 150.0–400.0)
RBC: 4.65 Mil/uL (ref 4.22–5.81)
RDW: 13.2 % (ref 11.5–15.5)
WBC: 6.4 10*3/uL (ref 4.0–10.5)

## 2022-02-04 LAB — COMPREHENSIVE METABOLIC PANEL
ALT: 35 U/L (ref 0–53)
AST: 22 U/L (ref 0–37)
Albumin: 4.6 g/dL (ref 3.5–5.2)
Alkaline Phosphatase: 48 U/L (ref 39–117)
BUN: 9 mg/dL (ref 6–23)
CO2: 27 mEq/L (ref 19–32)
Calcium: 9.6 mg/dL (ref 8.4–10.5)
Chloride: 104 mEq/L (ref 96–112)
Creatinine, Ser: 0.7 mg/dL (ref 0.40–1.50)
GFR: 100.61 mL/min (ref >60.00)
Glucose, Bld: 147 mg/dL — ABNORMAL HIGH (ref 70–99)
Potassium: 4.2 mEq/L (ref 3.5–5.1)
Sodium: 139 mEq/L (ref 135–145)
Total Bilirubin: 0.3 mg/dL (ref 0.2–1.2)
Total Protein: 6.8 g/dL (ref 6.0–8.3)

## 2022-02-04 LAB — MICROALBUMIN / CREATININE URINE RATIO
Creatinine,U: 29.6 mg/dL
Microalb Creat Ratio: 2.4 mg/g (ref 0.0–30.0)
Microalb, Ur: <0.7 mg/dL (ref 0.0–1.9)

## 2022-02-09 NOTE — Progress Notes (Signed)
Please call patient: I have reviewed his/her lab results. Labs are stable overall. Cholesterol looks good as does kidney and liver. Restarting the metformin should get diabetes back under control. I have completed her preoperative clearance so she and her surgeon can schedule surgery. Waiting at least 6 weeks would be beneficial to get sugars under better control. No other changes are needed at this time

## 2022-02-28 ENCOUNTER — Ambulatory Visit: Payer: Self-pay | Admitting: Orthopedic Surgery

## 2022-03-26 ENCOUNTER — Other Ambulatory Visit: Payer: Self-pay | Admitting: Orthopedic Surgery

## 2022-03-26 DIAGNOSIS — M542 Cervicalgia: Secondary | ICD-10-CM

## 2022-04-08 ENCOUNTER — Encounter (HOSPITAL_COMMUNITY): Admission: RE | Payer: Self-pay | Source: Home / Self Care

## 2022-04-08 ENCOUNTER — Inpatient Hospital Stay (HOSPITAL_COMMUNITY)
Admission: RE | Admit: 2022-04-08 | Payer: Medicare (Managed Care) | Source: Home / Self Care | Admitting: Orthopedic Surgery

## 2022-04-08 SURGERY — ANTERIOR CERVICAL DECOMPRESSION/DISCECTOMY FUSION 4 LEVELS
Anesthesia: General

## 2022-05-03 ENCOUNTER — Encounter: Payer: Self-pay | Admitting: Family Medicine

## 2022-05-03 ENCOUNTER — Ambulatory Visit (INDEPENDENT_AMBULATORY_CARE_PROVIDER_SITE_OTHER): Payer: Medicare (Managed Care) | Admitting: Family Medicine

## 2022-05-03 VITALS — BP 138/80 | HR 78 | Temp 98.8°F | Ht 69.0 in | Wt 196.6 lb

## 2022-05-03 DIAGNOSIS — E1165 Type 2 diabetes mellitus with hyperglycemia: Secondary | ICD-10-CM | POA: Diagnosis not present

## 2022-05-03 DIAGNOSIS — M25511 Pain in right shoulder: Secondary | ICD-10-CM | POA: Diagnosis not present

## 2022-05-03 DIAGNOSIS — G8929 Other chronic pain: Secondary | ICD-10-CM

## 2022-05-03 DIAGNOSIS — M25512 Pain in left shoulder: Secondary | ICD-10-CM

## 2022-05-03 DIAGNOSIS — M4722 Other spondylosis with radiculopathy, cervical region: Secondary | ICD-10-CM | POA: Diagnosis not present

## 2022-05-03 LAB — POCT GLYCOSYLATED HEMOGLOBIN (HGB A1C): Hemoglobin A1C: 7.4 % — AB (ref 4.0–5.6)

## 2022-05-03 MED ORDER — METFORMIN HCL 1000 MG PO TABS: 1000.0000 mg | ORAL_TABLET | Freq: Two times a day (BID) | ORAL | 3 refills | Status: DC

## 2022-08-04 ENCOUNTER — Other Ambulatory Visit: Payer: Self-pay | Admitting: Family Medicine

## 2022-08-09 ENCOUNTER — Ambulatory Visit (INDEPENDENT_AMBULATORY_CARE_PROVIDER_SITE_OTHER): Payer: Medicare (Managed Care) | Admitting: Family Medicine

## 2022-08-09 ENCOUNTER — Encounter: Payer: Self-pay | Admitting: Family Medicine

## 2022-08-09 VITALS — BP 120/60 | HR 67 | Temp 98.5°F | Ht 69.0 in | Wt 180.0 lb

## 2022-08-09 DIAGNOSIS — E782 Mixed hyperlipidemia: Secondary | ICD-10-CM

## 2022-08-09 DIAGNOSIS — E119 Type 2 diabetes mellitus without complications: Secondary | ICD-10-CM

## 2022-08-09 DIAGNOSIS — E1169 Type 2 diabetes mellitus with other specified complication: Secondary | ICD-10-CM

## 2022-08-09 DIAGNOSIS — M4722 Other spondylosis with radiculopathy, cervical region: Secondary | ICD-10-CM

## 2022-08-09 DIAGNOSIS — F411 Generalized anxiety disorder: Secondary | ICD-10-CM | POA: Diagnosis not present

## 2022-08-09 DIAGNOSIS — F17210 Nicotine dependence, cigarettes, uncomplicated: Secondary | ICD-10-CM | POA: Diagnosis not present

## 2022-08-09 DIAGNOSIS — Z23 Encounter for immunization: Secondary | ICD-10-CM

## 2022-08-09 LAB — POCT GLYCOSYLATED HEMOGLOBIN (HGB A1C): Hemoglobin A1C: 6.5 % — AB (ref 4.0–5.6)

## 2022-08-09 MED ORDER — BUPROPION HCL ER (XL) 150 MG PO TB24
150.0000 mg | ORAL_TABLET | Freq: Every morning | ORAL | 3 refills | Status: DC
Start: 2022-08-09 — End: 2022-11-10

## 2022-08-09 MED ORDER — TRIAMCINOLONE ACETONIDE 0.1 % EX CREA: 1.0000 | TOPICAL_CREAM | Freq: Two times a day (BID) | CUTANEOUS | 3 refills | Status: DC

## 2022-08-09 MED ORDER — AMITRIPTYLINE HCL 50 MG PO TABS
50.0000 mg | ORAL_TABLET | Freq: Every evening | ORAL | 3 refills | Status: DC | PRN
Start: 2022-08-09 — End: 2022-11-10

## 2022-08-09 MED ORDER — KETOCONAZOLE 2 % EX SHAM: 1.0000 | MEDICATED_SHAMPOO | CUTANEOUS | 3 refills | Status: DC

## 2022-08-09 MED ORDER — SHINGRIX 50 MCG/0.5ML IM SUSR: 0.5000 mL | Freq: Once | INTRAMUSCULAR | 0 refills | Status: DC

## 2022-08-09 MED ORDER — PRAZOSIN HCL 1 MG PO CAPS: 1.0000 mg | ORAL_CAPSULE | Freq: Every day | ORAL | 3 refills | Status: DC

## 2022-08-09 NOTE — Progress Notes (Signed)
Subjective  CC:  Chief Complaint  Patient presents with   Diabetes    HPI: Corey Collins is a 61 y.o. adult who presents to the office today for follow up of diabetes and problems listed above in the chief complaint.  Diabetes follow up: we increased dose of metformin at last visit due to unctonrolled readings. Now better. Eating better as well. Cooking at home more.  Her diabetic control is reported as Improved. Tolerating met 1000 bid.  She denies exertional CP or SOB or symptomatic hypoglycemia. She denies foot sores or paresthesias. Due flu shot, weight is down 16 pounds! HTN: well controlled.  HLD is at goal on statin Neck pain/radicular is doing a bit better with chiropractor. Trying to avoid surgery  Smoker: now light: about 3-4 cig per day. With copd likely but w/o complaints.   Wt Readings from Last 3 Encounters:  08/09/22 180 lb (81.6 kg)  05/03/22 196 lb 9.6 oz (89.2 kg)  02/03/22 196 lb 6.4 oz (89.1 kg)     BP Readings from Last 3 Encounters:  08/09/22 120/60  05/03/22 138/80  02/03/22 (!) 151/83    Assessment  1. Controlled type 2 diabetes mellitus without complication, without long-term current use of insulin ([place])   2. GAD (generalized anxiety disorder)   3. Combined hyperlipidemia associated with type 2 diabetes mellitus ([place])   4. Cigarette nicotine dependence without complication   5. Cervical radiculopathy due to degenerative joint disease of spine   6. Need for immunization against influenza      Plan  Diabetes is currently very well controlled. Cont met bid Flu shot today GAD is improved. Refilled wellbutrin, amitriptyline and prazosin. HLD on statin at goal Continue to encourage smoking cessation Neck pain continue with chiropractor. Improved.  Flu shot today.  Follow up: No follow-ups on file.. Orders Placed This Encounter  Procedures   Flu Vaccine QUAD 31moIM (Fluarix, Fluzone & Alfiuria Quad PF)   POCT HgB A1C   Meds ordered this  encounter  Medications   DISCONTD: Zoster Vaccine Adjuvanted ([facility] injection    Sig: Inject 0.5 mLs into the muscle once for 1 dose. Please give 2nd dose 2-6 months after first dose    Dispense:  2 each    Refill:  0   amitriptyline (ELAVIL) 50 MG tablet    Sig: Take 1 tablet (50 mg total) by mouth at bedtime as needed for sleep.    Dispense:  90 tablet    Refill:  3   buPROPion (WELLBUTRIN XL) 150 MG 24 hr tablet    Sig: Take 1 tablet (150 mg total) by mouth every morning.    Dispense:  90 tablet    Refill:  3   ketoconazole (NIZORAL) 2 % shampoo    Sig: Apply 1 Application topically 2 (two) times a week.    Dispense:  120 mL    Refill:  3   prazosin (MINIPRESS) 1 MG capsule    Sig: Take 1 capsule (1 mg total) by mouth at bedtime.    Dispense:  90 capsule    Refill:  3   triamcinolone cream (KENALOG) 0.1 %    Sig: Apply 1 Application topically 2 (two) times daily. For 2 weeks, then as needed    Dispense:  45 g    Refill:  3      Immunization History  Administered Date(s) Administered   Influenza,inj,Quad PF,6+ Mos 10/13/2017, 08/01/2019, 08/09/2022   Pneumococcal Polysaccharide-23 05/12/2017   Rabies, IM  07/11/2012   Rabies, intradermal 07/11/2012   Tdap 07/11/2012    Diabetes Related Lab Review: Lab Results  Component Value Date   HGBA1C 6.5 (A) 08/09/2022   HGBA1C 7.4 (A) 05/03/2022   HGBA1C 7.8 (A) 02/03/2022    Lab Results  Component Value Date   MICROALBUR <0.7 02/03/2022   Lab Results  Component Value Date   CREATININE 0.70 02/03/2022   BUN 9 02/03/2022   NA 139 02/03/2022   K 4.2 02/03/2022   CL 104 02/03/2022   CO2 27 02/03/2022   Lab Results  Component Value Date   CHOL 106 02/03/2022   CHOL 136 06/19/2020   CHOL 179 08/01/2019   Lab Results  Component Value Date   HDL 32.60 (L) 02/03/2022   HDL 36 (L) 06/19/2020   HDL 40.10 08/01/2019   Lab Results  Component Value Date   LDLCALC 56 02/03/2022   LDLCALC 68 06/19/2020   LDLCALC  93 05/31/2018   Lab Results  Component Value Date   TRIG 87.0 02/03/2022   TRIG 229 (H) 06/19/2020   TRIG 314.0 (H) 08/01/2019   Lab Results  Component Value Date   CHOLHDL 3 02/03/2022   CHOLHDL 3.8 06/19/2020   CHOLHDL 4 08/01/2019   Lab Results  Component Value Date   LDLDIRECT 103.0 08/01/2019   The ASCVD Risk score (Arnett DK, et al., 2019) failed to calculate for the following reasons:   The valid total cholesterol range is 130 to 320 mg/dL I have reviewed the PMH, Fam and Soc history. Patient Active Problem List   Diagnosis Date Noted   Combined hyperlipidemia associated with type 2 diabetes mellitus ([place]) 02/27/2020    Priority: High   Controlled type 2 diabetes mellitus without complication, without long-term current use of insulin ([place]) 02/26/2019    Priority: High   GAD (generalized anxiety disorder) 10/11/2017    Priority: High    Managed by psychiatry    Allergic asthma 12/15/2016    Priority: High   Cigarette nicotine dependence without complication 35/70/1779    Priority: High   Major depression, chronic 12/15/2016    Priority: High    Managed by psychiatry    Male-to-male transgender person 12/15/2016    Priority: High    Overview:  2010 transitioned. No hormone use now.     PTSD (post-traumatic stress disorder) 12/15/2016    Priority: High   S/P right THA, AA 05/24/2017    Priority: Medium    S/P left THA, AA 02/01/2017    Priority: Medium    Cervical radiculopathy due to degenerative joint disease of spine 09/29/2020    xrays 2020, cervical spine MRI 2021; [facility]    Carpal tunnel syndrome on right 09/29/2020    Severe by emg, 2021, [facility]    Ulnar neuropathy of right upper extremity 09/29/2020   Overweight (BMI 25.0-29.9) 05/25/2017    Social History: Patient  reports that she has been smoking cigarettes. She has never used smokeless tobacco. She reports current drug use. Drug: Marijuana. She reports that she does not drink  alcohol.  Review of Systems: Ophthalmic: negative for eye pain, loss of vision or double vision Cardiovascular: negative for chest pain Respiratory: negative for SOB or persistent cough Gastrointestinal: negative for abdominal pain Genitourinary: negative for dysuria or gross hematuria MSK: negative for foot lesions Neurologic: negative for weakness or gait disturbance  Objective  Vitals: BP 120/60   Pulse 67   Temp 98.5 F (36.9 C)   Ht 5'  9" (1.753 m)   Wt 180 lb (81.6 kg)   SpO2 96%   BMI 26.58 kg/m  General: well appearing, no acute distress  Psych:  Alert and oriented, normal mood and affect Cardiovascular:  Nl S1 and S2, RRR without murmur, gallop or rub. no edema Respiratory:  Good breath sounds bilaterally, CTAB with normal effort, no rales     Diabetic education: ongoing education regarding chronic disease management for diabetes was given today. We continue to reinforce the ABC's of diabetic management: A1c (<7 or 8 dependent upon patient), tight blood pressure control, and cholesterol management with goal LDL < 100 minimally. We discuss diet strategies, exercise recommendations, medication options and possible side effects. At each visit, we review recommended immunizations and preventive care recommendations for diabetics and stress that good diabetic control can prevent other problems. See below for this patient's data.   Commons side effects, risks, benefits, and alternatives for medications and treatment plan prescribed today were discussed, and the patient expressed understanding of the given instructions. Patient is instructed to call or message via MyChart if he/she has any questions or concerns regarding our treatment plan. No barriers to understanding were identified. We discussed Red Flag symptoms and signs in detail. Patient expressed understanding regarding what to do in case of urgent or emergency type symptoms.  Medication list was reconciled, printed and  provided to the patient in AVS. Patient instructions and summary information was reviewed with the patient as documented in the AVS. This note was prepared with assistance of Dragon voice recognition software. Occasional wrong-word or sound-a-like substitutions may have occurred due to the inherent limitations of voice recognition software

## 2022-08-09 NOTE — Patient Instructions (Signed)
Please return in 3 months for diabetes follow up   I have refilled your medications  Please take the prescription for Shingrix to the pharmacy so they may administer the vaccinations. Your insurance will then cover the injections.   If you have any questions or concerns, please don't hesitate to send me a message via MyChart or call the office at [phone]. Thank you for visiting with [place] today! It's our pleasure caring for you.

## 2022-10-14 ENCOUNTER — Ambulatory Visit (INDEPENDENT_AMBULATORY_CARE_PROVIDER_SITE_OTHER): Payer: Medicare (Managed Care)

## 2022-10-14 VITALS — Wt 180.0 lb

## 2022-10-14 DIAGNOSIS — Z1211 Encounter for screening for malignant neoplasm of colon: Secondary | ICD-10-CM | POA: Diagnosis not present

## 2022-10-14 DIAGNOSIS — Z0000 Encounter for general adult medical examination without abnormal findings: Secondary | ICD-10-CM | POA: Diagnosis not present

## 2022-10-14 DIAGNOSIS — F17200 Nicotine dependence, unspecified, uncomplicated: Secondary | ICD-10-CM

## 2022-10-14 NOTE — Progress Notes (Signed)
I connected with  Corey PeerChristopher R Collins on 10/14/22 by a audio enabled telemedicine application and verified that I am speaking with the correct person using two identifiers.  Patient Location: Home  Provider Location: Office/Clinic  I discussed the limitations of evaluation and management by telemedicine. The patient expressed understanding and agreed to proceed.   Subjective:   Corey PeerChristopher R Collins is a 61 y.o. male who presents for an Initial Medicare Annual Wellness Visit.  Review of Systems     Cardiac Risk Factors include: advanced age (>4955men, 60>65 women);dyslipidemia;male gender;smoking/ tobacco exposure     Objective:    Today's Vitals   10/14/22 1511  Weight: 180 lb (81.6 kg)   Body mass index is 26.58 kg/m.     10/14/2022    3:23 PM 02/05/2018    8:10 AM 05/24/2017    1:25 PM 05/24/2017    5:49 AM 05/17/2017   10:47 AM 02/01/2017    1:36 PM 02/01/2017    8:54 AM  Advanced Directives  Does Patient Have a Medical Advance Directive? No No No No No No No  Would patient like information on creating a medical advance directive? No - Patient declined No - Patient declined No - Patient declined No - Patient declined No - Patient declined No - Patient declined No - Patient declined    Current Medications (verified) Outpatient Encounter Medications as of 10/14/2022  Medication Sig   albuterol (VENTOLIN HFA) 108 (90 Base) MCG/ACT inhaler INHALE TWO PUFFS BY MOUTH EVERY 4 HOURS AS NEEDED FOR WHEEZING OR FOR SHORTNESS OF BREATH   ALPRAZolam (XANAX) 0.5 MG tablet Take 1 tablet (0.5 mg total) by mouth at bedtime as needed for anxiety.   buPROPion (WELLBUTRIN XL) 150 MG 24 hr tablet Take 1 tablet (150 mg total) by mouth every morning.   Cholecalciferol (VITAMIN D3 GUMMIES ADULT PO) Take 2 each by mouth daily.   Coenzyme Q10 (COQ-10) 50 MG CAPS Take 50 mg by mouth daily.    ketoconazole (NIZORAL) 2 % shampoo Apply 1 Application topically 2 (two) times a week.   metFORMIN (GLUCOPHAGE)  1000 MG tablet Take 1 tablet (1,000 mg total) by mouth 2 (two) times daily with a meal.   Omega-3 Fatty Acids (FISH OIL) 1000 MG CAPS Take 1,000 mg by mouth daily.    polyethylene glycol powder (GLYCOLAX/MIRALAX) 17 GM/SCOOP powder Take 17 g by mouth 2 (two) times daily as needed.   prazosin (MINIPRESS) 1 MG capsule Take 1 capsule (1 mg total) by mouth at bedtime.   Prenatal Vit-Min-FA-Fish Oil ([employer] PRENATAL GUMMY PO) Take 2 tablets by mouth daily.   simvastatin (ZOCOR) 20 MG tablet Take 1 tablet (20 mg total) by mouth at bedtime.   triamcinolone cream (KENALOG) 0.1 % Apply 1 Application topically 2 (two) times daily. For 2 weeks, then as needed   amitriptyline (ELAVIL) 50 MG tablet Take 1 tablet (50 mg total) by mouth at bedtime as needed for sleep. (Patient not taking: Reported on 10/14/2022)   No facility-administered encounter medications on file as of 10/14/2022.    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   xrays 2020   DVT (deep venous thrombosis) (HCC) 2009   L calf   Headache  sinus   History of kidney stones    Pneumonia age 45 or 20   Prediabetes 02/21/2018   On metformin   Primary osteoarthritis of right hip 12/15/2016   PTSD (post-traumatic stress disorder)    Puncture wound    2010 left lower leg    Seasonal allergies    Past Surgical History:  Procedure Laterality Date   APPENDECTOMY     BOWEL RESECTION  02/04/2018   Procedure: SMALL BOWEL RESECTION;  Surgeon: Berna Bue, MD;  Location: [facility];  Service: General;;   HERNIA REPAIR     INGUINAL HERNIA REPAIR Right 02/04/2018   Procedure: HERNIA REPAIR INGUINAL ADULT;  Surgeon: Berna Bue, MD;  Location: [facility];  Service: General;  Laterality:  Right;   LAPAROTOMY  02/04/2018   Procedure: EXPLORATORY LAPAROTOMY;  Surgeon: Berna Bue, MD;  Location: [facility];  Service: General;;   NASAL FRACTURE SURGERY     TOTAL HIP ARTHROPLASTY Left 02/01/2017   Procedure: LEFT TOTAL HIP ARTHROPLASTY ANTERIOR APPROACH;  Surgeon: Durene Romans, MD;  Location: [facility];  Service: Orthopedics;  Laterality: Left;   TOTAL HIP ARTHROPLASTY Right 05/24/2017   Procedure: RIGHT TOTAL HIP ARTHROPLASTY ANTERIOR APPROACH;  Surgeon: Durene Romans, MD;  Location: [facility];  Service: Orthopedics;  Laterality: Right;   Family History  Problem Relation Age of Onset   Coronary artery disease Father    Hypertension Other    Diabetes Other    Social History   Socioeconomic History   Marital status: Divorced    Spouse name: Not on file   Number of children: Not on file   Years of education: Not on file   Highest education level: Not on file  Occupational History   Occupation: disabled due to PTSD, former professor history  Tobacco Use   Smoking status: Some Days    Packs/day: 0.25    Years: 7.00    Total pack years: 1.75    Types: Cigarettes    Last attempt to quit: 01/06/2017    Years since quitting: 5.7   Smokeless tobacco: Never   Tobacco comments:    down to a couple cigs per down   Vaping Use   Vaping Use: Never used  Substance and Sexual Activity   Alcohol use: No   Drug use: Yes    Types: Marijuana    Comment: occ marijuana use, last used 2 weeks ago   Sexual activity: Yes    Birth control/protection: None  Other Topics Concern   Not on file  Social History Narrative   Not on file   Social Determinants of Health   Financial Resource Strain: Low Risk  (10/14/2022)   Overall Financial Resource Strain (CARDIA)    Difficulty of Paying Living Expenses: Not hard at all  Food Insecurity: No Food Insecurity (10/14/2022)   Hunger Vital Sign    Worried About Running Out of Food in the Last Year: Never true    Ran Out of Food in the Last Year:  Never true  Transportation Needs: No Transportation Needs (10/14/2022)   PRAPARE - Administrator, Civil Service (Medical): No    Lack of Transportation (Non-Medical): No  Physical Activity: Sufficiently Active (10/14/2022)   Exercise Vital Sign    Days of Exercise per Week: 5 days    Minutes of Exercise per Session: 50 min  Stress: No Stress Concern Present (10/14/2022)   [employer] of Occupational Health - Occupational Stress Questionnaire    Feeling  of Stress : Not at all  Social Connections: Socially Isolated (10/14/2022)   Social Connection and Isolation Panel [NHANES]    Frequency of Communication with Friends and Family: More than three times a week    Frequency of Social Gatherings with Friends and Family: More than three times a week    Attends Religious Services: Never    Database administrator or Organizations: No    Attends Engineer, structural: Never    Marital Status: Divorced    Tobacco Counseling Ready to quit: Not Answered Counseling given: Not Answered Tobacco comments: down to a couple cigs per down    Clinical Intake:  Pre-visit preparation completed: Yes  Pain : No/denies pain     BMI - recorded: 26.58 Nutritional Status: BMI 25 -29 Overweight Nutritional Risks: None Diabetes: Yes CBG done?: No Did pt. bring in CBG monitor from home?: No  How often do you need to have someone help you when you read instructions, pamphlets, or other written materials from your doctor or pharmacy?: 1 - Never  Diabetic?Nutrition Risk Assessment:  Has the patient had any N/V/D within the last 2 months?  At Times loose stool  Does the patient have any non-healing wounds?  No  Has the patient had any unintentional weight loss or weight gain?  No   Diabetes:  Is the patient diabetic?  Yes  If diabetic, was a CBG obtained today?  No  Did the patient bring in their glucometer from home?  No  How often do you monitor your CBG's? N/A.    Financial Strains and Diabetes Management:  Are you having any financial strains with the device, your supplies or your medication? No .  Does the patient want to be seen by Chronic Care Management for management of their diabetes?  No  Would the patient like to be referred to a Nutritionist or for Diabetic Management?  No   Diabetic Exams:  Diabetic Eye Exam: Overdue for diabetic eye exam. Pt has been advised about the importance in completing this exam. Patient advised to call and schedule an eye exam. Diabetic Foot Exam: Completed 05/03/22   Interpreter Needed?: No  Information entered by :: Lanier Ensign, LPN   Activities of Daily Living    10/14/2022    3:26 PM  In your present state of health, do you have any difficulty performing the following activities:  Hearing? 1  Comment right hearing loss  Vision? 0  Difficulty concentrating or making decisions? 0  Walking or climbing stairs? 0  Dressing or bathing? 0  Doing errands, shopping? 0  Preparing Food and eating ? N  Using the Toilet? N  In the past six months, have you accidently leaked urine? N  Do you have problems with loss of bowel control? N  Managing your Medications? N  Managing your Finances? N  Housekeeping or managing your Housekeeping? N    Patient Care Team: [name], MD as PCP - General (Family Medicine) Durene Romans, MD as Consulting Physician (Orthopedic Surgery)  Indicate any recent Medical Services you may have received from other than [facility] providers in the past year (date may be approximate).     Assessment:   This is a routine wellness examination for Anon Raices.  Hearing/Vision screen Hearing Screening - Comments:: Pt stated hearing loss in right ear  Vision Screening - Comments:: Pt  encouraged to follow up with provider   Dietary issues and exercise activities discussed: Current Exercise Habits: Home exercise routine,  Type of exercise: stretching, Time (Minutes): 50,  Frequency (Times/Week): 5, Weekly Exercise (Minutes/Week): 250   Goals Addressed             This Visit's Progress    Patient Stated       Stay active and healthy        Depression Screen    10/14/2022    3:18 PM 08/09/2022    3:12 PM 02/03/2022    4:09 PM 09/29/2020    2:00 PM 08/01/2019    1:36 PM 05/31/2018   11:16 AM 02/21/2018    2:09 PM  PHQ 2/9 Scores  PHQ - 2 Score 1 0 0 6  0 0  PHQ- 9 Score 4  6   0 0  Exception Documentation     Other- indicate reason in comment box      Fall Risk    10/14/2022    3:25 PM 08/09/2022    3:12 PM 02/27/2020    9:49 AM 02/26/2019   12:58 PM 12/19/2017    2:40 PM  Fall Risk   Falls in the past year? 0 0 1 0 No  Number falls in past yr: 0 0 0 0   Injury with Fall? 0 0 1 0   Risk for fall due to :  Orthopedic patient     Follow up Falls prevention discussed Falls evaluation completed  Falls evaluation completed     FALL RISK PREVENTION PERTAINING TO THE HOME:  Any stairs in or around the home? No  If so, are there any without handrails? No  Home free of loose throw rugs in walkways, pet beds, electrical cords, etc? Yes  Adequate lighting in your home to reduce risk of falls? Yes   ASSISTIVE DEVICES UTILIZED TO PREVENT FALLS:  Life alert? No  Use of a cane, walker or w/c? No  Grab bars in the bathroom? Yes  Shower chair or bench in shower? No  Elevated toilet seat or a handicapped toilet? No   TIMED UP AND GO:  Was the test performed? No .   Cognitive Function:        10/14/2022    3:27 PM  6CIT Screen  What Year? 0 points  What month? 0 points  What time? 0 points  Count back from 20 0 points  Months in reverse 0 points  Repeat phrase 0 points  Total Score 0 points    Immunizations Immunization History  Administered Date(s) Administered   Influenza,inj,Quad PF,6+ Mos 10/13/2017, 08/01/2019, 08/09/2022   Pneumococcal Polysaccharide-23 05/12/2017   Rabies, IM 07/11/2012   Rabies, intradermal 07/11/2012    Tdap 07/11/2012    TDAP status: Due, Education has been provided regarding the importance of this vaccine. Advised may receive this vaccine at local pharmacy or Health Dept. Aware to provide a copy of the vaccination record if obtained from local pharmacy or Health Dept. Verbalized acceptance and understanding.  Flu Vaccine status: Up to date    Covid-19 vaccine status: Declined, Education has been provided regarding the importance of this vaccine but patient still declined. Advised may receive this vaccine at local pharmacy or Health Dept.or vaccine clinic. Aware to provide a copy of the vaccination record if obtained from local pharmacy or Health Dept. Verbalized acceptance and understanding.  Qualifies for Shingles Vaccine? Yes   Zostavax completed No   Shingrix Completed?: No.    Education has been provided regarding the importance of this vaccine. Patient has been advised to call insurance company to  determine out of pocket expense if they have not yet received this vaccine. Advised may also receive vaccine at local pharmacy or Health Dept. Verbalized acceptance and understanding.  Screening Tests Health Maintenance  Topic Date Due   OPHTHALMOLOGY EXAM  Never done   Zoster Vaccines- Shingrix (1 of 2) Never done   DTaP/Tdap/Td (2 - Td or Tdap) 07/11/2022   COVID-19 Vaccine (1) 10/30/2022 (Originally 03/27/1966)   Fecal DNA (Cologuard)  02/04/2023 (Originally 03/27/2007)   Diabetic kidney evaluation - GFR measurement  02/04/2023   Diabetic kidney evaluation - Urine ACR  02/04/2023   HEMOGLOBIN A1C  02/08/2023   FOOT EXAM  05/04/2023   Medicare Annual Wellness (AWV)  10/15/2023   INFLUENZA VACCINE  Completed   Hepatitis C Screening  Completed   HIV Screening  Completed   HPV VACCINES  Aged Out    Health Maintenance  Health Maintenance Due  Topic Date Due   OPHTHALMOLOGY EXAM  Never done   Zoster Vaccines- Shingrix (1 of 2) Never done   DTaP/Tdap/Td (2 - Td or Tdap) 07/11/2022     Colorectal cancer screening: Referral to GI placed 10/14/22. Pt aware the office will call re: appt.  Lung Cancer Screening: (Low Dose CT Chest recommended if Age 66-80 years, 30 pack-year currently smoking OR have quit w/in 15years.) does qualify.   Lung Cancer Screening Referral: 10/14/22 placed   Additional Screening:  Hepatitis C Screening:  Completed 10/13/17  Vision Screening: Recommended annual ophthalmology exams for early detection of glaucoma and other disorders of the eye. Is the patient up to date with their annual eye exam?  No  Who is the provider or what is the name of the office in which the patient attends annual eye exams? Encouraged pt to follow up with provider  If pt is not established with a provider, would they like to be referred to a provider to establish care? Yes .   Dental Screening: Recommended annual dental exams for proper oral hygiene  Community Resource Referral / Chronic Care Management: CRR required this visit?  No   CCM required this visit?  No      Plan:     I have personally reviewed and noted the following in the patient's chart:   Medical and social history Use of alcohol, tobacco or illicit drugs  Current medications and supplements including opioid prescriptions. Patient is not currently taking opioid prescriptions. Functional ability and status Nutritional status Physical activity Advanced directives List of other physicians Hospitalizations, surgeries, and ER visits in previous 12 months Vitals Screenings to include cognitive, depression, and falls Referrals and appointments  In addition, I have reviewed and discussed with patient certain preventive protocols, quality metrics, and best practice recommendations. A written personalized care plan for preventive services as well as general preventive health recommendations were provided to patient.     Marzella Schlein, LPN   [id number]   Nurse Notes: none

## 2022-10-14 NOTE — Patient Instructions (Signed)
Ms. Temme , Thank you for taking time to come for your Medicare Wellness Visit. I appreciate your ongoing commitment to your health goals. Please review the following plan we discussed and let me know if I can assist you in the future.   These are the goals we discussed:  Goals   None     This is a list of the screening recommended for you and due dates:  Health Maintenance  Topic Date Due   Eye exam for diabetics  Never done   Zoster (Shingles) Vaccine (1 of 2) Never done   DTaP/Tdap/Td vaccine (2 - Td or Tdap) 07/11/2022   COVID-19 Vaccine (1) 10/30/2022*   Cologuard (Stool DNA test)  02/04/2023*   Yearly kidney function blood test for diabetes  02/04/2023   Yearly kidney health urinalysis for diabetes  02/04/2023   Hemoglobin A1C  02/08/2023   Complete foot exam   05/04/2023   Medicare Annual Wellness Visit  10/15/2023   Flu Shot  Completed   Hepatitis C Screening: USPSTF Recommendation to screen - Ages 18-79 yo.  Completed   HIV Screening  Completed   HPV Vaccine  Aged Out  *Topic was postponed. The date shown is not the original due date.    Advanced directives: Advance directive discussed with you today. Even though you declined this today please call our office should you change your mind and we can give you the proper paperwork for you to fill out.  Conditions/risks identified: stay active and healthy   Next appointment: Follow up in one year for your annual wellness visit.   Preventive Care 40-64 Years, Male Preventive care refers to lifestyle choices and visits with your health care provider that can promote health and wellness. What does preventive care include? A yearly physical exam. This is also called an annual well check. Dental exams once or twice a year. Routine eye exams. Ask your health care provider how often you should have your eyes checked. Personal lifestyle choices, including: Daily care of your teeth and gums. Regular physical activity. Eating a  healthy diet. Avoiding tobacco and drug use. Limiting alcohol use. Practicing safe sex. Taking low-dose aspirin daily starting at age 78. Taking vitamin and mineral supplements as recommended by your health care provider. What happens during an annual well check? The services and screenings done by your health care provider during your annual well check will depend on your age, overall health, lifestyle risk factors, and family history of disease. Counseling  Your health care provider may ask you questions about your: Alcohol use. Tobacco use. Drug use. Emotional well-being. Home and relationship well-being. Sexual activity. Eating habits. Work and work Statistician. Method of birth control. Menstrual cycle. Pregnancy history. Screening  You may have the following tests or measurements: Height, weight, and BMI. Blood pressure. Lipid and cholesterol levels. These may be checked every 5 years, or more frequently if you are over 37 years old. Skin check. Lung cancer screening. You may have this screening every year starting at age 31 if you have a 30-pack-year history of smoking and currently smoke or have quit within the past 15 years. Fecal occult blood test (FOBT) of the stool. You may have this test every year starting at age 90. Flexible sigmoidoscopy or colonoscopy. You may have a sigmoidoscopy every 5 years or a colonoscopy every 10 years starting at age 80. Hepatitis C blood test. Hepatitis B blood test. Sexually transmitted disease (STD) testing. Diabetes screening. This is done by checking your  blood sugar (glucose) after you have not eaten for a while (fasting). You may have this done every 1-3 years. Mammogram. This may be done every 1-2 years. Talk to your health care provider about when you should start having regular mammograms. This may depend on whether you have a family history of breast cancer. BRCA-related cancer screening. This may be done if you have a family  history of breast, ovarian, tubal, or peritoneal cancers. Pelvic exam and Pap test. This may be done every 3 years starting at age 79. Starting at age 33, this may be done every 5 years if you have a Pap test in combination with an HPV test. Bone density scan. This is done to screen for osteoporosis. You may have this scan if you are at high risk for osteoporosis. Discuss your test results, treatment options, and if necessary, the need for more tests with your health care provider. Vaccines  Your health care provider may recommend certain vaccines, such as: Influenza vaccine. This is recommended every year. Tetanus, diphtheria, and acellular pertussis (Tdap, Td) vaccine. You may need a Td booster every 10 years. Zoster vaccine. You may need this after age 53. Pneumococcal 13-valent conjugate (PCV13) vaccine. You may need this if you have certain conditions and were not previously vaccinated. Pneumococcal polysaccharide (PPSV23) vaccine. You may need one or two doses if you smoke cigarettes or if you have certain conditions. Talk to your health care provider about which screenings and vaccines you need and how often you need them. This information is not intended to replace advice given to you by your health care provider. Make sure you discuss any questions you have with your health care provider. Document Released: 11/21/2015 Document Revised: 07/14/2016 Document Reviewed: 08/26/2015 Elsevier Interactive Patient Education  2017 [place] Prevention in the Home Falls can cause injuries. They can happen to people of all ages. There are many things you can do to make your home safe and to help prevent falls. What can I do on the outside of my home? Regularly fix the edges of walkways and driveways and fix any cracks. Remove anything that might make you trip as you walk through a door, such as a raised step or threshold. Trim any bushes or trees on the path to your home. Use bright  outdoor lighting. Clear any walking paths of anything that might make someone trip, such as rocks or tools. Regularly check to see if handrails are loose or broken. Make sure that both sides of any steps have handrails. Any raised decks and porches should have guardrails on the edges. Have any leaves, snow, or ice cleared regularly. Use sand or salt on walking paths during winter. Clean up any spills in your garage right away. This includes oil or grease spills. What can I do in the bathroom? Use night lights. Install grab bars by the toilet and in the tub and shower. Do not use towel bars as grab bars. Use non-skid mats or decals in the tub or shower. If you need to sit down in the shower, use a plastic, non-slip stool. Keep the floor dry. Clean up any water that spills on the floor as soon as it happens. Remove soap buildup in the tub or shower regularly. Attach bath mats securely with double-sided non-slip rug tape. Do not have throw rugs and other things on the floor that can make you trip. What can I do in the bedroom? Use night lights. Make sure that  you have a light by your bed that is easy to reach. Do not use any sheets or blankets that are too big for your bed. They should not hang down onto the floor. Have a firm chair that has side arms. You can use this for support while you get dressed. Do not have throw rugs and other things on the floor that can make you trip. What can I do in the kitchen? Clean up any spills right away. Avoid walking on wet floors. Keep items that you use a lot in easy-to-reach places. If you need to reach something above you, use a strong step stool that has a grab bar. Keep electrical cords out of the way. Do not use floor polish or wax that makes floors slippery. If you must use wax, use non-skid floor wax. Do not have throw rugs and other things on the floor that can make you trip. What can I do with my stairs? Do not leave any items on the  stairs. Make sure that there are handrails on both sides of the stairs and use them. Fix handrails that are broken or loose. Make sure that handrails are as long as the stairways. Check any carpeting to make sure that it is firmly attached to the stairs. Fix any carpet that is loose or worn. Avoid having throw rugs at the top or bottom of the stairs. If you do have throw rugs, attach them to the floor with carpet tape. Make sure that you have a light switch at the top of the stairs and the bottom of the stairs. If you do not have them, ask someone to add them for you. What else can I do to help prevent falls? Wear shoes that: Do not have high heels. Have rubber bottoms. Are comfortable and fit you well. Are closed at the toe. Do not wear sandals. If you use a stepladder: Make sure that it is fully opened. Do not climb a closed stepladder. Make sure that both sides of the stepladder are locked into place. Ask someone to hold it for you, if possible. Clearly mark and make sure that you can see: Any grab bars or handrails. First and last steps. Where the edge of each step is. Use tools that help you move around (mobility aids) if they are needed. These include: Canes. Walkers. Scooters. Crutches. Turn on the lights when you go into a dark area. Replace any light bulbs as soon as they burn out. Set up your furniture so you have a clear path. Avoid moving your furniture around. If any of your floors are uneven, fix them. If there are any pets around you, be aware of where they are. Review your medicines with your doctor. Some medicines can make you feel dizzy. This can increase your chance of falling. Ask your doctor what other things that you can do to help prevent falls. This information is not intended to replace advice given to you by your health care provider. Make sure you discuss any questions you have with your health care provider. Document Released: 08/21/2009 Document Revised:  04/01/2016 Document Reviewed: 11/29/2014 Elsevier Interactive Patient Education  2017 [employer].

## 2022-11-10 ENCOUNTER — Ambulatory Visit (INDEPENDENT_AMBULATORY_CARE_PROVIDER_SITE_OTHER): Payer: Medicare (Managed Care) | Admitting: Family Medicine

## 2022-11-10 ENCOUNTER — Telehealth: Payer: Self-pay | Admitting: Family Medicine

## 2022-11-10 ENCOUNTER — Encounter: Payer: Self-pay | Admitting: Family Medicine

## 2022-11-10 VITALS — BP 128/74 | HR 71 | Temp 98.4°F | Ht 69.0 in | Wt 175.0 lb

## 2022-11-10 DIAGNOSIS — E119 Type 2 diabetes mellitus without complications: Secondary | ICD-10-CM | POA: Diagnosis not present

## 2022-11-10 DIAGNOSIS — F411 Generalized anxiety disorder: Secondary | ICD-10-CM | POA: Diagnosis not present

## 2022-11-10 DIAGNOSIS — E1169 Type 2 diabetes mellitus with other specified complication: Secondary | ICD-10-CM

## 2022-11-10 DIAGNOSIS — F329 Major depressive disorder, single episode, unspecified: Secondary | ICD-10-CM | POA: Diagnosis not present

## 2022-11-10 DIAGNOSIS — E782 Mixed hyperlipidemia: Secondary | ICD-10-CM

## 2022-11-10 DIAGNOSIS — F4310 Post-traumatic stress disorder, unspecified: Secondary | ICD-10-CM

## 2022-11-10 DIAGNOSIS — Z789 Other specified health status: Secondary | ICD-10-CM

## 2022-11-10 LAB — POCT GLYCOSYLATED HEMOGLOBIN (HGB A1C): Hemoglobin A1C: 6.8 % — AB (ref 4.0–5.6)

## 2022-11-10 MED ORDER — BUPROPION HCL ER (XL) 150 MG PO TB24: 150.0000 mg | ORAL_TABLET | Freq: Every morning | ORAL | 3 refills | Status: DC

## 2022-11-10 MED ORDER — SHINGRIX 50 MCG/0.5ML IM SUSR: 0.5000 mL | Freq: Once | INTRAMUSCULAR | 0 refills | Status: AC

## 2022-11-10 NOTE — Telephone Encounter (Signed)
Pt stated they forgot to ask for a referral for Podiatrist during their recent visit. Please advise

## 2022-11-10 NOTE — Progress Notes (Signed)
Subjective  CC:  Chief Complaint  Patient presents with   Diabetes    HPI: Corey Collins is a 62 y.o. adult who presents to the office today for follow up of diabetes and problems listed above in the chief complaint.  Diabetes follow up: Her diabetic control is reported as Unchanged.  She denies exertional CP or SOB or symptomatic hypoglycemia. She denies foot sores or paresthesias. Weight is down; diet is fairly good Mood: no longer seeing psychiatrist. Stopped wellbutrin. Elavil makes too sleepy. On prazosin for anxiety. Would restart wellbutrin if able.  Taking phytoestrogens and wants hormones checked. Otc.  Wt Readings from Last 3 Encounters:  11/10/22 175 lb (79.4 kg)  10/14/22 180 lb (81.6 kg)  08/09/22 180 lb (81.6 kg)    BP Readings from Last 3 Encounters:  11/10/22 128/74  08/09/22 120/60  05/03/22 138/80    Assessment  1. Controlled type 2 diabetes mellitus without complication, without long-term current use of insulin ([place])   2. Combined hyperlipidemia associated with type 2 diabetes mellitus ([place])   3. GAD (generalized anxiety disorder)   4. Major depression, chronic   5. PTSD (post-traumatic stress disorder)   6. Male-to-male transgender person      Plan  Diabetes is currently well controlled. Continue metformin HLD at goal Mood: rec psychiatry referral. Restart wellbutrin and continue prazosin.  Check estradiol level. Random.   Follow up: 3 mo for cpe. Orders Placed This Encounter  Procedures   Estradiol   Ambulatory referral to Psychiatry   POCT HgB A1C   Meds ordered this encounter  Medications   Zoster Vaccine Adjuvanted [facility]) injection    Sig: Inject 0.5 mLs into the muscle once for 1 dose. Please give 2nd dose 2-6 months after first dose    Dispense:  2 each    Refill:  0   buPROPion (WELLBUTRIN XL) 150 MG 24 hr tablet    Sig: Take 1 tablet (150 mg total) by mouth every morning.    Dispense:  90 tablet    Refill:  3       Immunization History  Administered Date(s) Administered   Influenza,inj,Quad PF,6+ Mos 10/13/2017, 08/01/2019, 08/09/2022   Pneumococcal Polysaccharide-23 05/12/2017   Rabies, IM 07/11/2012   Rabies, intradermal 07/11/2012   Tdap 07/11/2012    Diabetes Related Lab Review: Lab Results  Component Value Date   HGBA1C 6.8 (A) 11/10/2022   HGBA1C 6.5 (A) 08/09/2022   HGBA1C 7.4 (A) 05/03/2022    Lab Results  Component Value Date   MICROALBUR <0.7 02/03/2022   Lab Results  Component Value Date   CREATININE 0.70 02/03/2022   BUN 9 02/03/2022   NA 139 02/03/2022   K 4.2 02/03/2022   CL 104 02/03/2022   CO2 27 02/03/2022   Lab Results  Component Value Date   CHOL 106 02/03/2022   CHOL 136 06/19/2020   CHOL 179 08/01/2019   Lab Results  Component Value Date   HDL 32.60 (L) 02/03/2022   HDL 36 (L) 06/19/2020   HDL 40.10 08/01/2019   Lab Results  Component Value Date   LDLCALC 56 02/03/2022   LDLCALC 68 06/19/2020   LDLCALC 93 05/31/2018   Lab Results  Component Value Date   TRIG 87.0 02/03/2022   TRIG 229 (H) 06/19/2020   TRIG 314.0 (H) 08/01/2019   Lab Results  Component Value Date   CHOLHDL 3 02/03/2022   CHOLHDL 3.8 06/19/2020   CHOLHDL 4 08/01/2019   Lab Results  Component Value Date   LDLDIRECT 103.0 08/01/2019   The ASCVD Risk score (Arnett DK, et al., 2019) failed to calculate for the following reasons:   The valid total cholesterol range is 130 to 320 mg/dL I have reviewed the PMH, Fam and Soc history. Patient Active Problem List   Diagnosis Date Noted   Combined hyperlipidemia associated with type 2 diabetes mellitus (HCC) 02/27/2020    Priority: High   Controlled type 2 diabetes mellitus without complication, without long-term current use of insulin ([facility]) 02/26/2019    Priority: High   GAD (generalized anxiety disorder) 10/11/2017    Priority: High    Managed by psychiatry    Allergic asthma 12/15/2016    Priority: High   Cigarette  nicotine dependence without complication 12/15/2016    Priority: High   Major depression, chronic 12/15/2016    Priority: High    Managed by psychiatry    Male-to-male transgender person 12/15/2016    Priority: High    Overview:  2010 transitioned. No hormone use now.     PTSD (post-traumatic stress disorder) 12/15/2016    Priority: High   S/P right THA, AA 05/24/2017    Priority: Medium    S/P left THA, AA 02/01/2017    Priority: Medium    Cervical radiculopathy due to degenerative joint disease of spine 09/29/2020    xrays 2020, cervical spine MRI 2021; [facility]    Carpal tunnel syndrome on right 09/29/2020    Severe by em[facility], 2021, [facility]    Ulnar neuropathy of right upper extremity 09/29/2020   Overweight (BMI 25.0-29.9) 05/25/2017    Social History: Patient  reports that she has been smoking cigarettes. She has a 1.75 pack-year smoking history. She has never used smokeless tobacco. She reports current drug use. Drug: Marijuana. She reports that she does not drink alcohol.  Review of Systems: Ophthalmic: negative for eye pain, loss of vision or double vision Cardiovascular: negative for chest pain Respiratory: negative for SOB or persistent cough Gastrointestinal: negative for abdominal pain Genitourinary: negative for dysuria or gross hematuria MSK: negative for foot lesions Neurologic: negative for weakness or gait disturbance  Objective  Vitals: BP 128/74   Pulse 71   Temp 98.4 F (36.9 C)   Ht 5\' 9"  (1.753 m)   Wt 175 lb (79.4 kg)   SpO2 98%   BMI 25.84 kg/m  General: well appearing, no acute distress  Psych:  Alert and oriented, normal mood and affect  Diabetic education: ongoing education regarding chronic disease management for diabetes was given today. We continue to reinforce the ABC's of diabetic management: A1c (<7 or 8 dependent upon patient), tight blood pressure control, and cholesterol management with goal LDL < 100 minimally. We  discuss diet strategies, exercise recommendations, medication options and possible side effects. At each visit, we review recommended immunizations and preventive care recommendations for diabetics and stress that good diabetic control can prevent other problems. See below for this patient's data.   Commons side effects, risks, benefits, and alternatives for medications and treatment plan prescribed today were discussed, and the patient expressed understanding of the given instructions. Patient is instructed to call or message via MyChart if he/she has any questions or concerns regarding our treatment plan. No barriers to understanding were identified. We discussed Red Flag symptoms and signs in detail. Patient expressed understanding regarding what to do in case of urgent or emergency type symptoms.  Medication list was reconciled, printed and provided to the patient in AVS.  Patient instructions and summary information was reviewed with the patient as documented in the AVS. This note was prepared with assistance of Dragon voice recognition software. Occasional wrong-word or sound-a-like substitutions may have occurred due to the inherent limitations of voice recognition software

## 2022-11-11 LAB — ESTRADIOL: Estradiol: 25 pg/mL (ref <=39)

## 2022-11-11 NOTE — Progress Notes (Signed)
Low estradiol levels reported to pt.

## 2022-11-12 ENCOUNTER — Other Ambulatory Visit: Payer: Self-pay

## 2022-11-12 DIAGNOSIS — L600 Ingrowing nail: Secondary | ICD-10-CM

## 2022-11-12 NOTE — Telephone Encounter (Signed)
Referral placed.

## 2022-12-09 ENCOUNTER — Other Ambulatory Visit: Payer: Self-pay | Admitting: Family Medicine

## 2022-12-13 ENCOUNTER — Telehealth: Payer: Self-pay | Admitting: Family Medicine

## 2022-12-13 NOTE — Telephone Encounter (Signed)
PT STATES THEY ARE SUPPOSED TO GE ON A HIGHER DOSE BUT I DO NOT SEE IT IN THE CHART. PLEASE ADVICE    Encourage patient to contact the pharmacy for refills or they can request refills through [place]:  Please schedule appointment if longer than 1 year  NEXT APPOINTMENT DATE:  MEDICATION:   buPROPion (WELLBUTRIN XL) 150 MG 24 hr tablet   Is the patient out of medication? Yes  [facility] [id number] [place], [place] Phone: [phone]  Fax: [phone]      Let patient know to contact pharmacy at the end of the day to make sure medication is ready.  Please notify patient to allow 48-72 hours to process

## 2022-12-14 NOTE — Telephone Encounter (Signed)
I have her on wellbutrin 150mg  xr daily for the last several visits.  If she was getting a different dose from another provider she did not update me.  To restart, I recommend starting at the 150mg  dose that was ordered.   Please clarify.  Dr. Jonni Sanger

## 2022-12-14 NOTE — Telephone Encounter (Signed)
Mailbox is full.

## 2022-12-15 NOTE — Telephone Encounter (Signed)
Meds were sent in last week.

## 2022-12-24 ENCOUNTER — Encounter: Payer: Self-pay | Admitting: *Deleted

## 2023-02-07 ENCOUNTER — Encounter: Payer: Self-pay | Admitting: Family Medicine

## 2023-02-07 ENCOUNTER — Ambulatory Visit (INDEPENDENT_AMBULATORY_CARE_PROVIDER_SITE_OTHER): Payer: Medicare (Managed Care) | Admitting: Family Medicine

## 2023-02-07 VITALS — BP 110/78 | HR 74 | Temp 98.7°F | Ht 69.0 in | Wt 171.0 lb

## 2023-02-07 DIAGNOSIS — F329 Major depressive disorder, single episode, unspecified: Secondary | ICD-10-CM

## 2023-02-07 DIAGNOSIS — E119 Type 2 diabetes mellitus without complications: Secondary | ICD-10-CM

## 2023-02-07 DIAGNOSIS — E1169 Type 2 diabetes mellitus with other specified complication: Secondary | ICD-10-CM

## 2023-02-07 DIAGNOSIS — E782 Mixed hyperlipidemia: Secondary | ICD-10-CM | POA: Diagnosis not present

## 2023-02-07 DIAGNOSIS — F17210 Nicotine dependence, cigarettes, uncomplicated: Secondary | ICD-10-CM | POA: Diagnosis not present

## 2023-02-07 DIAGNOSIS — F411 Generalized anxiety disorder: Secondary | ICD-10-CM

## 2023-02-07 MED ORDER — SIMVASTATIN 20 MG PO TABS: 20.0000 mg | ORAL_TABLET | Freq: Every day | ORAL | 3 refills | Status: DC

## 2023-02-07 NOTE — Progress Notes (Signed)
Subjective  CC:  Chief Complaint  Patient presents with   Diabetes    HPI: Corey Collins is a 62 y.o. adult who presents to the office today for follow up of diabetes and problems listed above in the chief complaint.  Diabetes follow up: Her diabetic control is reported as Unchanged. Tolerating metformin bid.  She denies exertional CP or SOB or symptomatic hypoglycemia. She denies foot sores or paresthesias. Overdue for eye exam Wt Readings from Last 3 Encounters:  02/07/23 171 lb (77.6 kg)  11/10/22 175 lb (79.4 kg)  10/14/22 180 lb (81.6 kg)  Discussed smoking; variable. Now about 1/2 pak per day but often less. In last 13 years, < pak per day. Not qualifying for lung cancer screen yet but if continues smoking, will. Not ready to quit.  Mood is stable on wellburtin and prazosin. Not seeing psych.  HLD on statin due for recheck. Non fasting.  HM: hasn't yet gotten shingrix nor has she done the cologuard but has the kit.   BP Readings from Last 3 Encounters:  02/07/23 110/78  11/10/22 128/74  08/09/22 120/60    Assessment  1. Controlled type 2 diabetes mellitus without complication, without long-term current use of insulin   2. Combined hyperlipidemia associated with type 2 diabetes mellitus   3. GAD (generalized anxiety disorder)   4. Cigarette nicotine dependence without complication   5. Major depression, chronic      Plan  Diabetes; recheck A1c. And urine nephropathy screen. On met bid.  HLD recheck lipids and lfts on pravachol Continue mood meds w/o changes Smoking cessation discussed but anxiety/mood are barriers. Monitor for when becomes eligible for lung cancer screening.  Rec shingrix and cologuard.   Follow up: 3 mo for f/u and cpe. Orders Placed This Encounter  Procedures   CBC with Differential/Platelet   Comprehensive metabolic panel   Hemoglobin A1c   Lipid panel   TSH   Microalbumin / creatinine urine ratio   Meds ordered this encounter   Medications   simvastatin (ZOCOR) 20 MG tablet    Sig: Take 1 tablet (20 mg total) by mouth at bedtime.    Dispense:  90 tablet    Refill:  3      Immunization History  Administered Date(s) Administered   Influenza,inj,Quad PF,6+ Mos 10/13/2017, 08/01/2019, 08/09/2022   Pneumococcal Polysaccharide-23 05/12/2017   Rabies, IM 07/11/2012   Rabies, intradermal 07/11/2012   Tdap 07/11/2012    Diabetes Related Lab Review: Lab Results  Component Value Date   HGBA1C 6.8 (A) 11/10/2022   HGBA1C 6.5 (A) 08/09/2022   HGBA1C 7.4 (A) 05/03/2022    Lab Results  Component Value Date   MICROALBUR <0.7 02/03/2022   Lab Results  Component Value Date   CREATININE 0.70 02/03/2022   BUN 9 02/03/2022   NA 139 02/03/2022   K 4.2 02/03/2022   CL 104 02/03/2022   CO2 27 02/03/2022   Lab Results  Component Value Date   CHOL 106 02/03/2022   CHOL 136 06/19/2020   CHOL 179 08/01/2019   Lab Results  Component Value Date   HDL 32.60 (L) 02/03/2022   HDL 36 (L) 06/19/2020   HDL 40.10 08/01/2019   Lab Results  Component Value Date   LDLCALC 56 02/03/2022   LDLCALC 68 06/19/2020   LDLCALC 93 05/31/2018   Lab Results  Component Value Date   TRIG 87.0 02/03/2022   TRIG 229 (H) 06/19/2020   TRIG 314.0 (H) 08/01/2019  Lab Results  Component Value Date   CHOLHDL 3 02/03/2022   CHOLHDL 3.8 06/19/2020   CHOLHDL 4 08/01/2019   Lab Results  Component Value Date   LDLDIRECT 103.0 08/01/2019   The ASCVD Risk score (Arnett DK, et al., 2019) failed to calculate for the following reasons:   The valid total cholesterol range is 130 to 320 mg/dL I have reviewed the PMH, Fam and Soc history. Patient Active Problem List   Diagnosis Date Noted   Cervical radiculopathy due to degenerative joint disease of spine 09/29/2020    Priority: High    xrays 2020, cervical spine MRI 2021; [facility]    Carpal tunnel syndrome on right 09/29/2020    Priority: High    Severe by emg, 2021, [facility]    Combined hyperlipidemia associated with type 2 diabetes mellitus 02/27/2020    Priority: High   Controlled type 2 diabetes mellitus without complication, without long-term current use of insulin 02/26/2019    Priority: High   GAD (generalized anxiety disorder) 10/11/2017    Priority: High    Managed by psychiatry    Allergic asthma 12/15/2016    Priority: High   Cigarette nicotine dependence without complication 0000000    Priority: High   Major depression, chronic 12/15/2016    Priority: High    Managed by psychiatry    Male-to-male transgender person 12/15/2016    Priority: High    Overview:  2010 transitioned. No hormone use now.     PTSD (post-traumatic stress disorder) 12/15/2016    Priority: High   Ulnar neuropathy of right upper extremity 09/29/2020    Priority: Medium    S/P right THA, AA 05/24/2017    Priority: Medium    S/P left THA, AA 02/01/2017    Priority: Medium    Overweight (BMI 25.0-29.9) 05/25/2017    Social History: Patient  reports that she has been smoking cigarettes. She has a 9.75 pack-year smoking history. She has never used smokeless tobacco. She reports current drug use. Drug: Marijuana. She reports that she does not drink alcohol.  Review of Systems: Ophthalmic: negative for eye pain, loss of vision or double vision Cardiovascular: negative for chest pain Respiratory: negative for SOB or persistent cough Gastrointestinal: negative for abdominal pain Genitourinary: negative for dysuria or gross hematuria MSK: negative for foot lesions Neurologic: negative for weakness or gait disturbance  Objective  Vitals: BP 110/78   Pulse 74   Temp 98.7 F (37.1 C)   Ht 5\' 9"  (1.753 m)   Wt 171 lb (77.6 kg)   SpO2 96%   BMI 25.25 kg/m  General: well appearing, no acute distress  Psych:  Alert and oriented, normal mood and affect HEENT:  Normocephalic, atraumatic, moist mucous membranes, supple neck  Cardiovascular:  Nl S1 and S2,  RRR without murmur, gallop or rub. no edema Respiratory:  Good breath sounds bilaterally, CTAB with normal effort, no rales Ext no edema    Diabetic education: ongoing education regarding chronic disease management for diabetes was given today. We continue to reinforce the ABC's of diabetic management: A1c (<7 or 8 dependent upon patient), tight blood pressure control, and cholesterol management with goal LDL < 100 minimally. We discuss diet strategies, exercise recommendations, medication options and possible side effects. At each visit, we review recommended immunizations and preventive care recommendations for diabetics and stress that good diabetic control can prevent other problems. See below for this patient's data.   Commons side effects, risks, benefits, and alternatives  for medications and treatment plan prescribed today were discussed, and the patient expressed understanding of the given instructions. Patient is instructed to call or message via MyChart if he/she has any questions or concerns regarding our treatment plan. No barriers to understanding were identified. We discussed Red Flag symptoms and signs in detail. Patient expressed understanding regarding what to do in case of urgent or emergency type symptoms.  Medication list was reconciled, printed and provided to the patient in AVS. Patient instructions and summary information was reviewed with the patient as documented in the AVS. This note was prepared with assistance of Dragon voice recognition software. Occasional wrong-word or sound-a-like substitutions may have occurred due to the inherent limitations of voice recognition software

## 2023-02-07 NOTE — Patient Instructions (Addendum)
Please return in 3 months for complete physical and diabetes follow up   I will release your lab results to you on your MyChart account with further instructions. You may see the results before I do, but when I review them I will send you a message with my report or have my assistant call you if things need to be discussed. Please reply to my message with any questions. Thank you!   Please get the cologuard sample sent in.  Please go to [facility] and start your shingrix vaccines (2 shots 6 months apart)  If you have any questions or concerns, please don't hesitate to send me a message via MyChart or call the office at [phone]. Thank you for visiting with [place] today! It's our pleasure caring for you.

## 2023-02-08 LAB — CBC WITH DIFFERENTIAL/PLATELET
Basophils Absolute: 0 10*3/uL (ref 0.0–0.1)
Basophils Relative: 0.7 % (ref 0.0–3.0)
Eosinophils Absolute: 0.5 10*3/uL (ref 0.0–0.7)
Eosinophils Relative: 6.6 % — ABNORMAL HIGH (ref 0.0–5.0)
HCT: 46 % (ref 39.0–52.0)
Hemoglobin: 15.3 g/dL (ref 13.0–17.0)
Lymphocytes Relative: 24 % (ref 12.0–46.0)
Lymphs Abs: 1.6 10*3/uL (ref 0.7–4.0)
MCHC: 33.3 g/dL (ref 30.0–36.0)
MCV: 92.9 fl (ref 78.0–100.0)
Monocytes Absolute: 0.4 10*3/uL (ref 0.1–1.0)
Monocytes Relative: 6.5 % (ref 3.0–12.0)
Neutro Abs: 4.3 10*3/uL (ref 1.4–7.7)
Neutrophils Relative %: 62.2 % (ref 43.0–77.0)
Platelets: 284 10*3/uL (ref 150.0–400.0)
RBC: 4.96 Mil/uL (ref 4.22–5.81)
RDW: 13.5 % (ref 11.5–15.5)
WBC: 6.9 10*3/uL (ref 4.0–10.5)

## 2023-02-08 LAB — COMPREHENSIVE METABOLIC PANEL
ALT: 18 U/L (ref 0–53)
AST: 15 U/L (ref 0–37)
Albumin: 4.8 g/dL (ref 3.5–5.2)
Alkaline Phosphatase: 59 U/L (ref 39–117)
BUN: 15 mg/dL (ref 6–23)
CO2: 27 mEq/L (ref 19–32)
Calcium: 10.1 mg/dL (ref 8.4–10.5)
Chloride: 103 mEq/L (ref 96–112)
Creatinine, Ser: 0.61 mg/dL (ref 0.40–1.50)
GFR: 104.13 mL/min (ref >60.00)
Glucose, Bld: 114 mg/dL — ABNORMAL HIGH (ref 70–99)
Potassium: 4.4 mEq/L (ref 3.5–5.1)
Sodium: 136 mEq/L (ref 135–145)
Total Bilirubin: 0.3 mg/dL (ref 0.2–1.2)
Total Protein: 6.9 g/dL (ref 6.0–8.3)

## 2023-02-08 LAB — TSH: TSH: 0.68 u[IU]/mL (ref 0.35–5.50)

## 2023-02-08 LAB — LIPID PANEL
Cholesterol: 128 mg/dL (ref 0–200)
HDL: 47.7 mg/dL (ref >39.00)
LDL Cholesterol: 61 mg/dL (ref 0–99)
NonHDL: 80.09
Total CHOL/HDL Ratio: 3
Triglycerides: 94 mg/dL (ref 0.0–149.0)
VLDL: 18.8 mg/dL (ref 0.0–40.0)

## 2023-02-08 LAB — MICROALBUMIN / CREATININE URINE RATIO
Creatinine,U: 75.4 mg/dL
Microalb Creat Ratio: 1.1 mg/g (ref 0.0–30.0)
Microalb, Ur: 0.8 mg/dL (ref 0.0–1.9)

## 2023-02-08 LAB — HEMOGLOBIN A1C: Hgb A1c MFr Bld: 7.2 % — ABNORMAL HIGH (ref 4.6–6.5)

## 2023-02-24 ENCOUNTER — Ambulatory Visit: Payer: Medicare (Managed Care) | Admitting: Family Medicine

## 2023-05-09 ENCOUNTER — Encounter: Payer: Medicare (Managed Care) | Admitting: Family Medicine

## 2023-06-01 ENCOUNTER — Other Ambulatory Visit: Payer: Self-pay | Admitting: Family Medicine

## 2023-06-06 ENCOUNTER — Other Ambulatory Visit: Payer: Self-pay | Admitting: Family Medicine

## 2023-06-13 ENCOUNTER — Telehealth: Payer: Self-pay | Admitting: Family Medicine

## 2023-06-13 ENCOUNTER — Other Ambulatory Visit: Payer: Self-pay

## 2023-06-13 MED ORDER — TRIAMCINOLONE ACETONIDE 0.1 % EX CREA: 1.0000 | TOPICAL_CREAM | Freq: Two times a day (BID) | CUTANEOUS | 3 refills | Status: DC

## 2023-06-13 MED ORDER — METFORMIN HCL 1000 MG PO TABS: 1000.0000 mg | ORAL_TABLET | Freq: Two times a day (BID) | ORAL | 3 refills | Status: DC

## 2023-06-13 NOTE — Telephone Encounter (Signed)
Patient states he is stuck out of town due to [facility], he is unable to get back to [place] - requests the following RX's be sent to Pharmacy listed below  metFORMIN (GLUCOPHAGE) 1000 MG tablet   AND  triamcinolone cream (KENALOG) 0.1 %   [facility] [address] [place], [place] PH# [phone]  Patient requests to be called once RX's Have been sent Pharmacy listed above

## 2023-06-13 NOTE — Telephone Encounter (Signed)
Rx sent 

## 2023-08-01 ENCOUNTER — Ambulatory Visit (INDEPENDENT_AMBULATORY_CARE_PROVIDER_SITE_OTHER): Payer: Medicare (Managed Care) | Admitting: Family Medicine

## 2023-08-01 ENCOUNTER — Telehealth: Payer: Self-pay | Admitting: Family Medicine

## 2023-08-01 ENCOUNTER — Encounter: Payer: Self-pay | Admitting: Family Medicine

## 2023-08-01 VITALS — BP 120/70 | HR 70 | Temp 98.7°F | Ht 69.0 in | Wt 174.8 lb

## 2023-08-01 DIAGNOSIS — E119 Type 2 diabetes mellitus without complications: Secondary | ICD-10-CM | POA: Diagnosis not present

## 2023-08-01 DIAGNOSIS — F17210 Nicotine dependence, cigarettes, uncomplicated: Secondary | ICD-10-CM

## 2023-08-01 DIAGNOSIS — E1169 Type 2 diabetes mellitus with other specified complication: Secondary | ICD-10-CM | POA: Diagnosis not present

## 2023-08-01 DIAGNOSIS — Z0000 Encounter for general adult medical examination without abnormal findings: Secondary | ICD-10-CM

## 2023-08-01 DIAGNOSIS — E782 Mixed hyperlipidemia: Secondary | ICD-10-CM | POA: Diagnosis not present

## 2023-08-01 DIAGNOSIS — Z122 Encounter for screening for malignant neoplasm of respiratory organs: Secondary | ICD-10-CM

## 2023-08-01 DIAGNOSIS — M4722 Other spondylosis with radiculopathy, cervical region: Secondary | ICD-10-CM

## 2023-08-01 DIAGNOSIS — Z23 Encounter for immunization: Secondary | ICD-10-CM | POA: Diagnosis not present

## 2023-08-01 DIAGNOSIS — F411 Generalized anxiety disorder: Secondary | ICD-10-CM

## 2023-08-01 DIAGNOSIS — Z7984 Long term (current) use of oral hypoglycemic drugs: Secondary | ICD-10-CM | POA: Diagnosis not present

## 2023-08-01 DIAGNOSIS — Z0001 Encounter for general adult medical examination with abnormal findings: Secondary | ICD-10-CM | POA: Diagnosis not present

## 2023-08-01 DIAGNOSIS — F329 Major depressive disorder, single episode, unspecified: Secondary | ICD-10-CM

## 2023-08-01 MED ORDER — SHINGRIX 50 MCG/0.5ML IM SUSR: 0.5000 mL | Freq: Once | INTRAMUSCULAR | 0 refills | Status: AC

## 2023-08-01 MED ORDER — HYDROXYZINE PAMOATE 25 MG PO CAPS: 25.0000 mg | ORAL_CAPSULE | Freq: Every day | ORAL | 3 refills | Status: DC

## 2023-08-01 MED ORDER — SHINGRIX 50 MCG/0.5ML IM SUSR: 0.5000 mL | Freq: Once | INTRAMUSCULAR | 0 refills | Status: DC

## 2023-08-01 MED ORDER — PRAZOSIN HCL 1 MG PO CAPS: 1.0000 mg | ORAL_CAPSULE | Freq: Every day | ORAL | 3 refills | Status: DC

## 2023-08-01 NOTE — Patient Instructions (Signed)
Please return in 3-6 months for recheck. I will let you know which after I receive your lab results. We will call you with results.    If you have any questions or concerns, please don't hesitate to send me a message via MyChart or call the office at [phone]. Thank you for visiting with [place] today! It's our pleasure caring for you.   I have referred you for a lung cancer screening CT scan.  Please call the number below to get scheduled.  I believe they send you a letter back in February for the same. [phone]  Please complete your Cologuard colon cancer screening test.  Your kit should be good through December.  Please take the prescription for Shingrix to the pharmacy so they may administer the vaccinations. Your insurance will then cover the injections.   Please set up an appointment for a diabetic eye exam and have the results sent to me.

## 2023-08-01 NOTE — Progress Notes (Signed)
Subjective  Chief Complaint  Patient presents with   Annual Exam   Diabetes    HPI: Corey Collins is a 62 y.o. adult who presents to [facility] [place] today for a Male Wellness Visit. She also has the concerns and/or needs as listed above in the chief complaint. These will be addressed in addition to the Health Maintenance Visit.   Wellness Visit: annual visit with health maintenance review and exam  Health maintenance: We have discussed lung cancer screening recommendations, colon cancer screening recommendations, diabetic eye exam recommendations: Unfortunately patient has been unable to get any of these done.  She continues to be willing.  She has Cologuard at home.  Has been referred for lung cancer screening and can refer again.  Continues to smoke.  Now she has Medicare and is able to get an eye exam.  She says she will schedule. Chronic smoker, many barriers to quitting.  Counseling has been done. Lung Cancer Screening: Recommended in people who have a 30 pack year tobacco history, are between ages 71 and 51, and who smoke now, or have quit within the past 15 years.  Flu vaccine eligible today.  Shingrix eligible and can get at pharmacy.  She would be willing to get both.  Chronic disease f/u and/or acute problem visit: (deemed necessary to be done in addition to the wellness visit): Diabetes: She reports her control is good on metformin.  Denies symptoms of hypoglycemia.  Has no known complications.  Due for recheck.  Overdue for eye exam. Hyperlipidemia on statin, due for recheck. Reports mood is stable, managed by psychiatry. Chronic asthma with cat allergy on inhalers Cervical DJD, was to have surgery but this got canceled.  Reports no new symptoms.  Assessment  1. Encounter for well adult exam with abnormal findings   2. Need for influenza vaccination   3. Controlled type 2 diabetes mellitus without complication, without long-term current use of  insulin (HCC)   4. Combined hyperlipidemia associated with type 2 diabetes mellitus (HCC)   5. Cigarette nicotine dependence without complication   6. GAD (generalized anxiety disorder)   7. Major depression, chronic   8. Cervical radiculopathy due to degenerative joint disease of spine   9. Encounter for screening for lung cancer      Plan  Male Wellness Visit: Age appropriate Health Maintenance and Prevention measures were discussed with patient. Included topics are cancer screening recommendations, ways to keep healthy (see AVS) including dietary and exercise recommendations, regular eye and dental care, use of seat belts, and avoidance of moderate alcohol use and tobacco use.  BMI: discussed patient's BMI and encouraged positive lifestyle modifications to help get to or maintain a target BMI. HM needs and immunizations were addressed and ordered. See below for orders. See HM and immunization section for updates.  Flu shot updated today in office.  Shingrix vaccination prescription given.  Counseling done Routine labs and screening tests ordered including cmp, cbc and lipids where appropriate. Discussed recommendations regarding Vit D and calcium supplementation (see AVS)  Chronic disease management visit and/or acute problem visit: Type 2 diabetes on metformin 1000 twice daily.  Clinically stable.  Check A1c today.  Check urine nephropathy screen.  Recommend eye exam.  Continue diet. Hyperlipidemia on simvastatin 20 mg nightly due for recheck and LFTs.  Tolerates well Chronic smoker: At risk for lung cancer, refer again for lung cancer screening.  Recommend patient call for an appointment.  See after visit summary.  Mood disorder per psychiatry.  Refilled prazosin.  1 mg nightly Follows with Ortho for cervical radiculopathy and DJD.  No changes  Follow up: 6 months for recheck Orders Placed This Encounter  Procedures   Flu vaccine trivalent PF, 6mos and  older(Flulaval,Afluria,Fluarix,Fluzone)   CBC with Differential/Platelet   Comprehensive metabolic panel   Lipid panel   Hemoglobin A1c   TSH   Microalbumin / creatinine urine ratio   Ambulatory Referral for Lung Cancer Scre   Meds ordered this encounter  Medications   DISCONTD: Zoster Vaccine Adjuvanted [facility]) injection    Sig: Inject 0.5 mLs into the muscle once for 1 dose. Please give 2nd dose 2-6 months after first dose    Dispense:  2 each    Refill:  0   Zoster Vaccine Adjuvanted [facility]) injection    Sig: Inject 0.5 mLs into the muscle once for 1 dose. Please give 2nd dose 2-6 months after first dose    Dispense:  2 each    Refill:  0   prazosin (MINIPRESS) 1 MG capsule    Sig: Take 1 capsule (1 mg total) by mouth at bedtime.    Dispense:  90 capsule    Refill:  3      Body mass index is 25.81 kg/m. Wt Readings from Last 3 Encounters:  08/01/23 174 lb 12.8 oz (79.3 kg)  02/07/23 171 lb (77.6 kg)  11/10/22 175 lb (79.4 kg)     Patient Active Problem List   Diagnosis Date Noted Date Diagnosed   Cervical radiculopathy due to degenerative joint disease of spine 09/29/2020     Priority: High    xrays 2020, cervical spine MRI 2021; [facility]    Carpal tunnel syndrome on right 09/29/2020     Priority: High    Severe by emg, 2021, [facility]    Combined hyperlipidemia associated with type 2 diabetes mellitus (HCC) 02/27/2020     Priority: High    Simvastatin 20mg  qhs    Controlled type 2 diabetes mellitus without complication, without long-term current use of insulin (HCC) 02/26/2019     Priority: High    Metformin 1000 bid    GAD (generalized anxiety disorder) 10/11/2017     Priority: High    Managed by psychiatry    Allergic asthma 12/15/2016     Priority: High   Cigarette nicotine dependence without complication 12/15/2016     Priority: High   Major depression, chronic 12/15/2016     Priority: High    Managed by psychiatry     Male-to-male transgender person 12/15/2016     Priority: High    Overview:  2010 transitioned. No hormone use now.     PTSD (post-traumatic stress disorder) 12/15/2016     Priority: High   Ulnar neuropathy of right upper extremity 09/29/2020     Priority: Medium    S/P right THA, AA 05/24/2017     Priority: Medium    S/P left THA, AA 02/01/2017     Priority: Medium    Overweight (BMI 25.0-29.9) 05/25/2017    Health Maintenance  Topic Date Due   OPHTHALMOLOGY EXAM  Never done   Zoster Vaccines- Shingrix (1 of 2) Never done   Fecal DNA (Cologuard)  Never done   FOOT EXAM  05/04/2023   COVID-19 Vaccine (1) 08/17/2023 (Originally 03/27/1966)   HEMOGLOBIN A1C  08/09/2023   Medicare Annual Wellness (AWV)  10/15/2023   Diabetic kidney evaluation - eGFR measurement  02/07/2024   Diabetic kidney  evaluation - Urine ACR  02/07/2024   INFLUENZA VACCINE  Completed   Hepatitis C Screening  Completed   HIV Screening  Completed   HPV VACCINES  Aged Out   DTaP/Tdap/Td  Discontinued   Immunization History  Administered Date(s) Administered   Influenza, Seasonal, Injecte, Preservative Fre 08/01/2023   Influenza,inj,Quad PF,6+ Mos 10/13/2017, 08/01/2019, 08/09/2022   Pneumococcal Polysaccharide-23 05/12/2017   Rabies, IM 07/11/2012   Rabies, intradermal 07/11/2012   Tdap 07/11/2012   We updated and reviewed the patient's past history in detail and it is documented below. Allergies: Patient is allergic to sulfa antibiotics. Past Medical History Patient  has a past medical history of Arthritis, Asthma, Borderline diabetes, Combined hyperlipidemia associated with type 2 diabetes mellitus (HCC) (02/27/2020), DJD (degenerative joint disease) of cervical spine (09/29/2020), DVT (deep venous thrombosis) (HCC) (2009), Headache, History of kidney stones, Pneumonia (age 70 or 56), Prediabetes (02/21/2018), Primary osteoarthritis of right hip (12/15/2016), PTSD (post-traumatic stress disorder), Puncture  wound, and Seasonal allergies. 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 Family History: Patient family history includes Coronary artery disease in her father; Diabetes in an other family member; Hypertension in an other family member. Social History:  Patient  reports that she has been smoking cigarettes. She started smoking about 19 years ago. She has a 9.8 pack-year smoking history. She has never used smokeless tobacco. She reports current drug use. Drug: Marijuana. She reports that she does not drink alcohol.  Review of Systems: Constitutional: negative for fever or malaise Ophthalmic: negative for photophobia, double vision or loss of vision Cardiovascular: negative for chest pain, dyspnea on exertion, or new LE swelling Respiratory: negative for SOB or persistent cough Gastrointestinal: negative for abdominal pain, change in bowel habits or melena Genitourinary: negative for dysuria or gross hematuria, no abnormal uterine bleeding or disharge Musculoskeletal: negative for new gait disturbance or muscular weakness Integumentary: negative for new or persistent rashes, no breast lumps Neurological: negative for TIA or stroke symptoms Psychiatric: negative for SI or delusions Allergic/Immunologic: negative for hives  Patient Care Team    Relationship Specialty Notifications Start End  [name], MD PCP - General Family Medicine  01/26/17   Durene Romans, MD Consulting Physician Orthopedic Surgery  02/27/20   Venita Lick, MD Consulting Physician Orthopedic Surgery  08/01/23     Objective  Vitals: BP 120/70   Pulse 70   Temp 98.7 F (37.1 C)   Ht 5\' 9"  (1.753 m)   Wt 174 lb 12.8 oz (79.3 kg)   SpO2 94%   BMI  25.81 kg/m  General:  Well developed, well nourished, no acute distress  Psych:  Alert and orientedx3,normal mood and affect HEENT:  Normocephalic, atraumatic, non-icteric sclera,  supple neck without adenopathy, mass or thyromegaly Cardiovascular:  Normal S1, S2, RRR without gallop, rub or murmur Respiratory: Fair breath sounds bilaterally, CTAB with normal respiratory effort, occasional wheeze and rhonchi noted Gastrointestinal: normal bowel sounds, soft, non-tender, no noted masses. No HSM MSK: extremities without edema, joints without erythema or swelling Neurologic:    Mental status is normal.  Gross motor and sensory exams are normal.  No tremor Diabetic Foot Exam: Appearance - no lesions, ulcers or significant calluses Skin - no sigificant pallor or erythema Pulses - +2 distally bilaterally  Commons side effects, risks, benefits, and  alternatives for medications and treatment plan prescribed today were discussed, and the patient expressed understanding of the given instructions. Patient is instructed to call or message via MyChart if he/she has any questions or concerns regarding our treatment plan. No barriers to understanding were identified. We discussed Red Flag symptoms and signs in detail. Patient expressed understanding regarding what to do in case of urgent or emergency type symptoms.  Medication list was reconciled, printed and provided to the patient in AVS. Patient instructions and summary information was reviewed with the patient as documented in the AVS. This note was prepared with assistance of Dragon voice recognition software. Occasional wrong-word or sound-a-like substitutions may have occurred due to the inherent limitations of voice recognition software

## 2023-08-01 NOTE — Telephone Encounter (Signed)
Pt states forgot to ask Dr Mardelle Matte regarding medication below. I can not find it on the med list. Please advise.   Prescription Request  08/01/2023  LOV: 08/01/2023  What is the name of the medication or equipment? Hydroxyzine  Have you contacted your pharmacy to request a refill? No   Which pharmacy would you like this sent to?   [id number] - [place], [place] - [id number] Phone: [phone]  Fax: [phone]      Patient notified that their request is being sent to the clinical staff for review and that they should receive a response within 2 business days.   Please advise at Mobile [phone] (mobile)

## 2023-08-02 LAB — LIPID PANEL
Cholesterol: 156 mg/dL (ref 0–200)
HDL: 55.8 mg/dL (ref >39.00)
LDL Cholesterol: 63 mg/dL (ref 0–99)
NonHDL: 100.31
Total CHOL/HDL Ratio: 3
Triglycerides: 186 mg/dL — ABNORMAL HIGH (ref 0.0–149.0)
VLDL: 37.2 mg/dL (ref 0.0–40.0)

## 2023-08-02 LAB — COMPREHENSIVE METABOLIC PANEL
ALT: 18 U/L (ref 0–53)
AST: 16 U/L (ref 0–37)
Albumin: 4.5 g/dL (ref 3.5–5.2)
Alkaline Phosphatase: 63 U/L (ref 39–117)
BUN: 14 mg/dL (ref 6–23)
CO2: 25 mEq/L (ref 19–32)
Calcium: 9.4 mg/dL (ref 8.4–10.5)
Chloride: 102 mEq/L (ref 96–112)
Creatinine, Ser: 0.58 mg/dL (ref 0.40–1.50)
GFR: 105.38 mL/min (ref >60.00)
Glucose, Bld: 93 mg/dL (ref 70–99)
Potassium: 4.1 mEq/L (ref 3.5–5.1)
Sodium: 138 mEq/L (ref 135–145)
Total Bilirubin: 0.4 mg/dL (ref 0.2–1.2)
Total Protein: 6.7 g/dL (ref 6.0–8.3)

## 2023-08-02 LAB — CBC WITH DIFFERENTIAL/PLATELET
Basophils Absolute: 0.1 10*3/uL (ref 0.0–0.1)
Basophils Relative: 1 % (ref 0.0–3.0)
Eosinophils Absolute: 0.5 10*3/uL (ref 0.0–0.7)
Eosinophils Relative: 8.3 % — ABNORMAL HIGH (ref 0.0–5.0)
HCT: 43.2 % (ref 39.0–52.0)
Hemoglobin: 14.2 g/dL (ref 13.0–17.0)
Lymphocytes Relative: 22.6 % (ref 12.0–46.0)
Lymphs Abs: 1.4 10*3/uL (ref 0.7–4.0)
MCHC: 32.9 g/dL (ref 30.0–36.0)
MCV: 93.4 fl (ref 78.0–100.0)
Monocytes Absolute: 0.4 10*3/uL (ref 0.1–1.0)
Monocytes Relative: 6.7 % (ref 3.0–12.0)
Neutro Abs: 3.8 10*3/uL (ref 1.4–7.7)
Neutrophils Relative %: 61.4 % (ref 43.0–77.0)
Platelets: 258 10*3/uL (ref 150.0–400.0)
RBC: 4.63 Mil/uL (ref 4.22–5.81)
RDW: 13.8 % (ref 11.5–15.5)
WBC: 6.2 10*3/uL (ref 4.0–10.5)

## 2023-08-02 LAB — MICROALBUMIN / CREATININE URINE RATIO
Creatinine,U: 94.8 mg/dL
Microalb Creat Ratio: 1 mg/g (ref 0.0–30.0)
Microalb, Ur: 0.9 mg/dL (ref 0.0–1.9)

## 2023-08-02 LAB — HEMOGLOBIN A1C: Hgb A1c MFr Bld: 6.9 % — ABNORMAL HIGH (ref 4.6–6.5)

## 2023-08-02 LAB — TSH: TSH: 0.68 u[IU]/mL (ref 0.35–5.50)

## 2023-08-04 NOTE — Progress Notes (Signed)
Please call patient: Please let her know that her lab results are all stable.  Cholesterol and diabetes levels look good.  No changes are needed at this time.

## 2023-09-12 ENCOUNTER — Other Ambulatory Visit: Payer: Self-pay | Admitting: Family Medicine

## 2023-09-13 ENCOUNTER — Other Ambulatory Visit: Payer: Self-pay

## 2023-09-13 ENCOUNTER — Telehealth: Payer: Self-pay | Admitting: Family Medicine

## 2023-09-13 IMAGING — MR MR CERVICAL SPINE W/O CM
4 of 5 series · 26 of 48 positions shown · non-contrast
Comparison: Cervical spine radiographs 06/27/2019

CLINICAL DATA: Chronic pain with right shoulder and arm pain for 2
years

EXAM:
MRI CERVICAL SPINE WITHOUT CONTRAST
TECHNIQUE: Multiplanar, multisequence MR imaging of the cervical spine was
performed. No intravenous contrast was administered.

[Series 3: T2 · sagittal · 3.0mm · 0.66mm/px · 6 of 15 slices shown (1 of 2)]
[im 1/15]
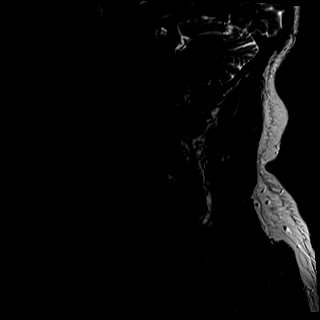
[im 3/15]
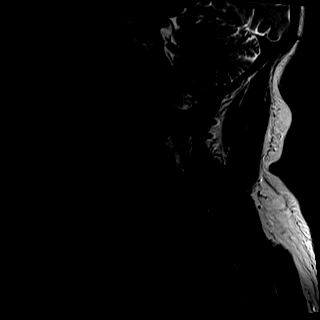
[im 6/15]
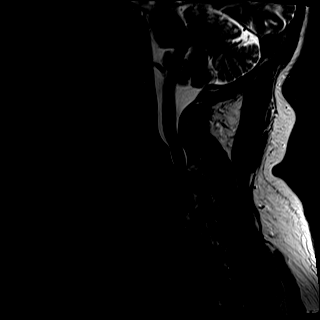
[im 9/15]
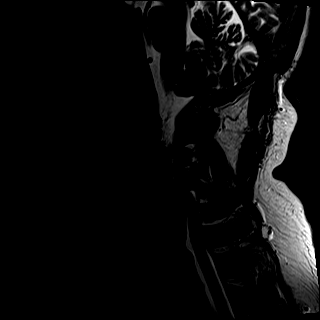
[im 12/15]
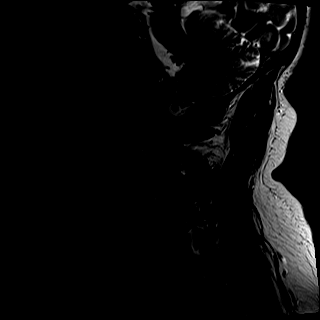
[im 15/15]
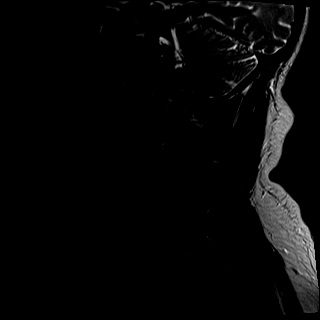

[Series 4: tir sag · sagittal · 3.0mm · 0.41mm/px · 5 of 15 slices shown]
[im 1/15]
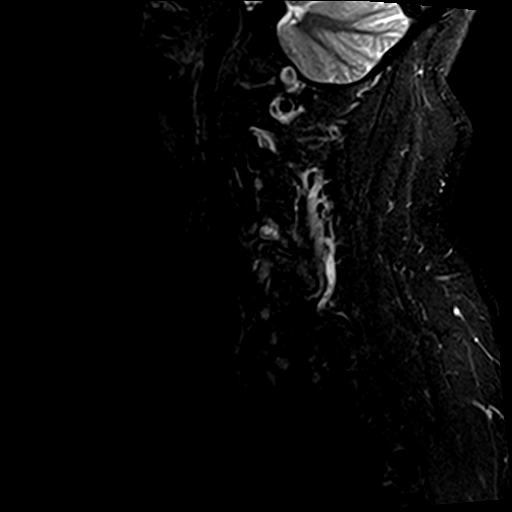
[im 3/15]
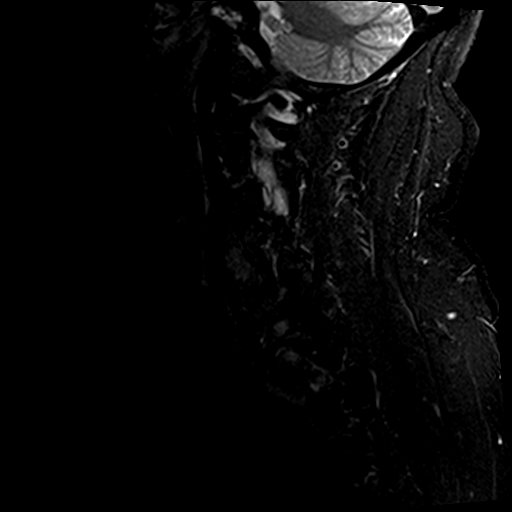
[im 5/15]
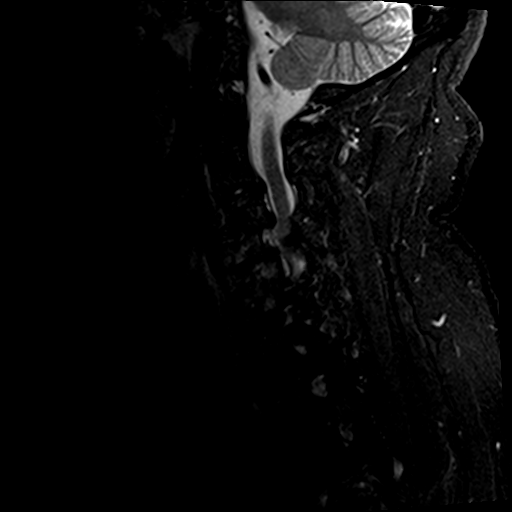
[im 8/15]
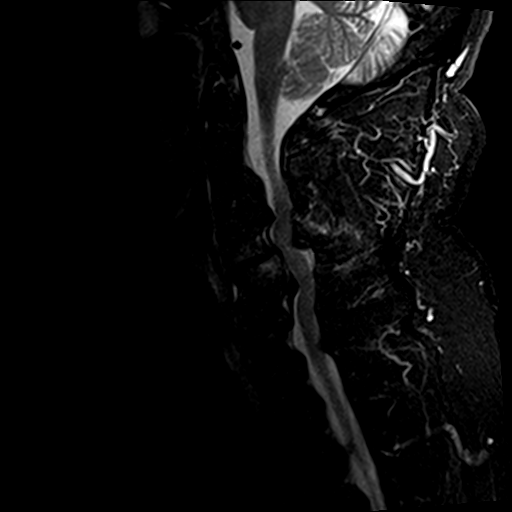
[im 12/15]
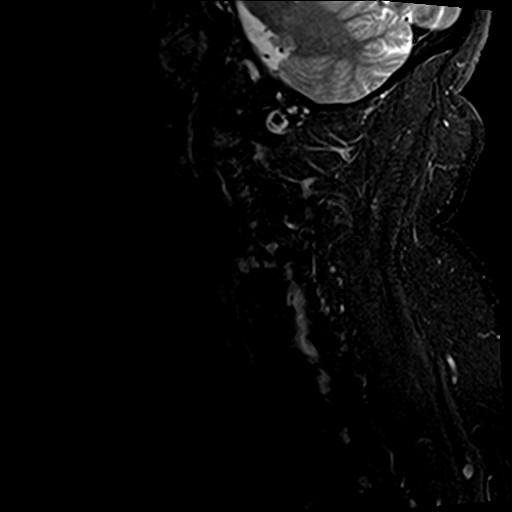

[Series 5: T1 · sagittal · 3.0mm · 0.41mm/px · 7 of 15 slices shown]
[im 1/15]
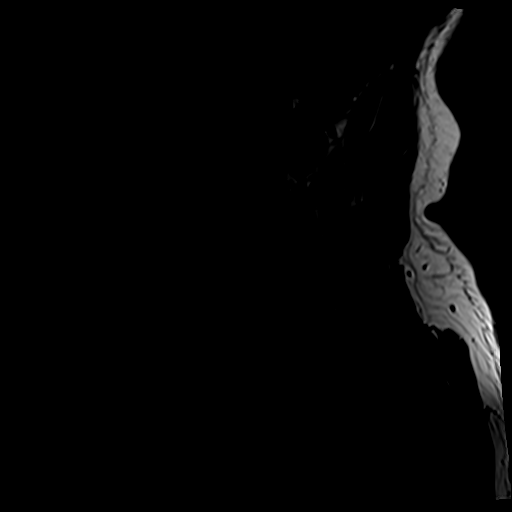
[im 3/15]
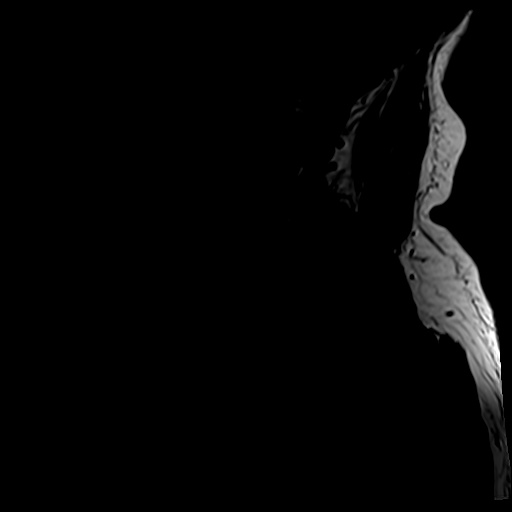
[im 5/15]
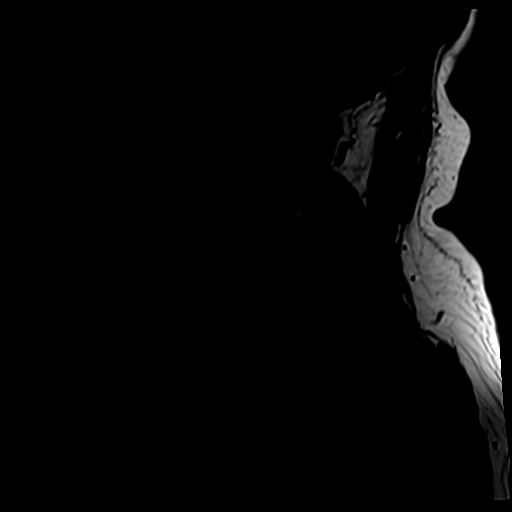
[im 8/15]
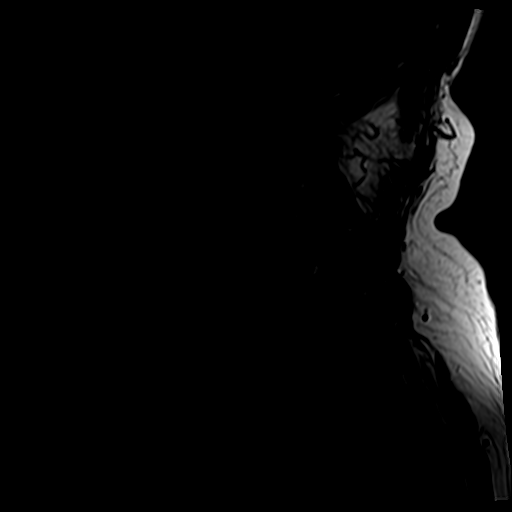
[im 10/15]
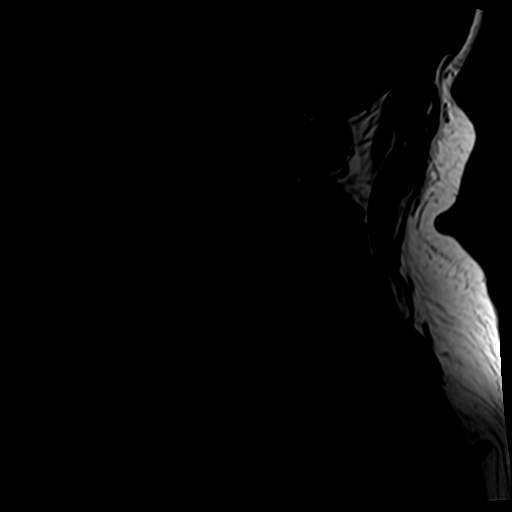
[im 12/15]
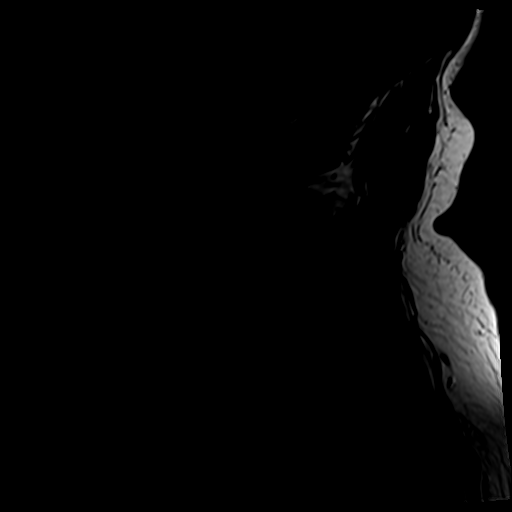
[im 15/15]
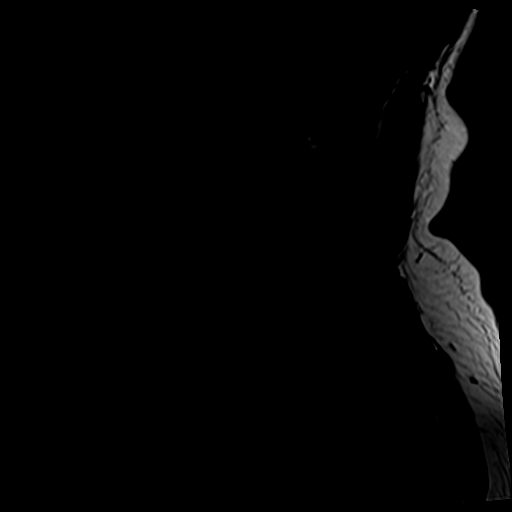

[Series 6: T2 · axial · 3.0mm · 0.70mm/px · z∈[-62,+44]mm · 8 of 30 slices shown (2 of 2)]
[im 1/30]
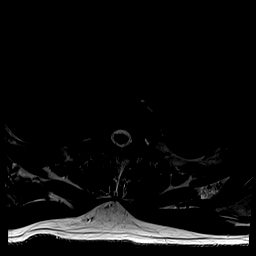
[im 5/30]
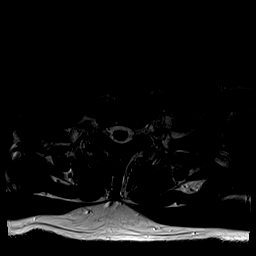
[im 9/30]
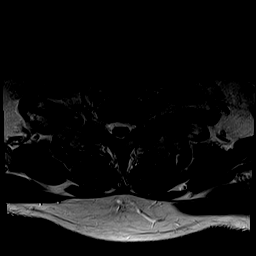
[im 14/30]
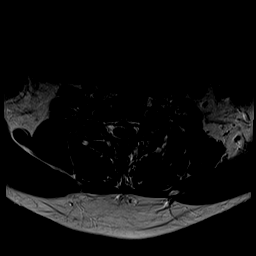
[im 16/30]
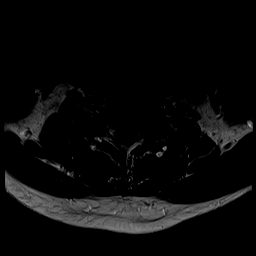
[im 21/30]
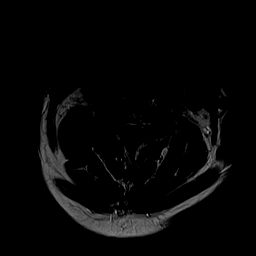
[im 25/30]
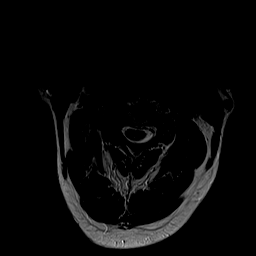
[im 30/30]
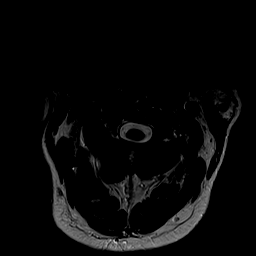

[26 of 48 positions shown; findings below may reference images not displayed]

FINDINGS: Alignment: There is grade 1 retrolisthesis of C3 on C4, grade 1
anterolisthesis of C4 on C5, grade 1 retrolisthesis of C5 on C6, and
grade 1 anterolisthesis of C7 on T1.

Vertebrae: Vertebral body heights are preserved. Marrow signal is
somewhat heterogeneous throughout with STIR signal abnormality along
the C4 and C5 endplates, likely degenerative in nature. STIR signal
abnormality in the left C4 posterior elements is also favored to be
degenerative in nature. There is no suspicious marrow signal
abnormality.

Cord: There is D formation of the cervical cord at multiple levels,
most severe at C4-C5. No definite cord signal abnormality is
identified.

Posterior Fossa, vertebral arteries, paraspinal tissues: The imaged
posterior fossa is unremarkable. The vertebral artery flow voids are
present. The paraspinal soft tissues are unremarkable.

Disc levels:

C2-C3: There is uncovertebral and bilateral facet arthropathy
resulting in mild bilateral neural foraminal stenosis and moderate
spinal canal stenosis.

C3-C4: There is a posterior disc osteophyte complex with prominent
uncovertebral ridging and bilateral facet arthropathy resulting in
moderate to severe spinal canal stenosis and severe left worse than
right neural foraminal stenosis.

C4-C5: There is grade 1 anterolisthesis with associated
uncovertebral arthropathy, prominent bilateral facet arthropathy,
and ligamentum flavum thickening resulting in severe spinal canal
stenosis with cord compression and severe bilateral neural foraminal
stenosis.

C5-C6: There is a posterior disc osteophyte complex with a probable
left paracentral protrusion, uncovertebral ridging, and bilateral
facet arthropathy resulting in severe stenosis of the left aspect of
the spinal canal with compression of the left portion of the cord,
and severe left worse than right neural foraminal stenosis.

C6-C7: There is a mild posterior disc osteophyte complex and
uncovertebral and facet arthropathy resulting in mild spinal canal
stenosis and severe right worse than left neural foraminal stenosis

C7-T1: There is grade 1 anterolisthesis with mild degenerative
endplate change and bilateral facet arthropathy resulting in mild
spinal canal stenosis and severe left and mild right neural
foraminal stenosis.
IMPRESSION: 1. Advanced multilevel spondylosis and spondylolisthesis as detailed
above resulting in severe spinal canal stenosis with cord
compression at C4-C5 and C5-C6 and moderate to severe spinal canal
stenosis at C3-C4. No definite cord signal abnormality is seen.
2. Multilevel severe neural foraminal stenosis as detailed above.
3. Multilevel degenerative endplate change and facet arthropathy
STIR signal abnormality in the left C4 posterior elements, likely
reactive/degenerative in nature. Correlate with any focal symptoms,
and if there is clinical concern for fracture, recommend cervical
spine CT.

These results will be called to the ordering clinician or
representative by the Radiologist Assistant, and communication
documented in the PACS or [REDACTED].

## 2023-09-13 MED ORDER — METFORMIN HCL 1000 MG PO TABS: 1000.0000 mg | ORAL_TABLET | Freq: Two times a day (BID) | ORAL | 3 refills | Status: DC

## 2023-09-13 NOTE — Telephone Encounter (Signed)
Prescription Request  09/13/2023  LOV: 08/01/2023  What is the name of the medication or equipment? metFORMIN (GLUCOPHAGE) 1000 MG tablet   Have you contacted your pharmacy to request a refill? Yes   Which pharmacy would you like this sent to?   [facility] [address] [address] [place], [place] - [place], [place] - [address] [place] [address] Phone: [phone] Fax: [phone]    Patient notified that their request is being sent to the clinical staff for review and that they should receive a response within 2 business days.   Please advise at Mobile [phone] (mobile)

## 2023-09-29 ENCOUNTER — Other Ambulatory Visit: Payer: Self-pay

## 2023-09-29 ENCOUNTER — Telehealth: Payer: Self-pay | Admitting: Family Medicine

## 2023-09-29 MED ORDER — KETOCONAZOLE 2 % EX SHAM: 1.0000 | MEDICATED_SHAMPOO | CUTANEOUS | 3 refills | Status: AC

## 2023-09-29 MED ORDER — TRIAMCINOLONE ACETONIDE 0.1 % EX CREA: 1.0000 | TOPICAL_CREAM | Freq: Two times a day (BID) | CUTANEOUS | 3 refills | Status: DC

## 2023-09-29 NOTE — Telephone Encounter (Signed)
Prescription Request  09/29/2023  LOV: 08/01/2023  What is the name of the medication or equipment?  ketoconazole (NIZORAL) 2 % shampoo   triamcinolone cream (KENALOG) 0.1 %    Have you contacted your pharmacy to request a refill? No   Which pharmacy would you like this sent to? [facility] [address] [address]- [place], [place] - [place], [place] - [address] [place] [address] Phone: [phone] Fax: [phone]   Patient notified that their request is being sent to the clinical staff for review and that they should receive a response within 2 business days.   Please advise at Mobile [phone] (mobile)

## 2023-10-31 ENCOUNTER — Ambulatory Visit: Payer: Medicare (Managed Care)

## 2023-10-31 LAB — HM DIABETES EYE EXAM

## 2023-11-14 ENCOUNTER — Encounter: Payer: Self-pay | Admitting: Family Medicine

## 2023-11-14 ENCOUNTER — Ambulatory Visit (INDEPENDENT_AMBULATORY_CARE_PROVIDER_SITE_OTHER): Payer: Medicare (Managed Care) | Admitting: Family Medicine

## 2023-11-14 VITALS — BP 162/90 | HR 70 | Ht 69.0 in | Wt 172.8 lb

## 2023-11-14 DIAGNOSIS — F329 Major depressive disorder, single episode, unspecified: Secondary | ICD-10-CM | POA: Diagnosis not present

## 2023-11-14 DIAGNOSIS — H9201 Otalgia, right ear: Secondary | ICD-10-CM

## 2023-11-14 DIAGNOSIS — E119 Type 2 diabetes mellitus without complications: Secondary | ICD-10-CM

## 2023-11-14 DIAGNOSIS — Z1211 Encounter for screening for malignant neoplasm of colon: Secondary | ICD-10-CM

## 2023-11-14 DIAGNOSIS — E1169 Type 2 diabetes mellitus with other specified complication: Secondary | ICD-10-CM

## 2023-11-14 DIAGNOSIS — G5603 Carpal tunnel syndrome, bilateral upper limbs: Secondary | ICD-10-CM

## 2023-11-14 DIAGNOSIS — R030 Elevated blood-pressure reading, without diagnosis of hypertension: Secondary | ICD-10-CM

## 2023-11-14 DIAGNOSIS — E782 Mixed hyperlipidemia: Secondary | ICD-10-CM

## 2023-11-14 DIAGNOSIS — F411 Generalized anxiety disorder: Secondary | ICD-10-CM

## 2023-11-14 DIAGNOSIS — F17210 Nicotine dependence, cigarettes, uncomplicated: Secondary | ICD-10-CM | POA: Diagnosis not present

## 2023-11-14 DIAGNOSIS — Z122 Encounter for screening for malignant neoplasm of respiratory organs: Secondary | ICD-10-CM

## 2023-11-14 LAB — POCT GLYCOSYLATED HEMOGLOBIN (HGB A1C): Hemoglobin A1C: 6.5 % — AB (ref 4.0–5.6)

## 2023-11-14 MED ORDER — AMOXICILLIN 875 MG PO TABS: 875.0000 mg | ORAL_TABLET | Freq: Two times a day (BID) | ORAL | 0 refills | Status: AC

## 2023-11-14 MED ORDER — BUPROPION HCL ER (XL) 300 MG PO TB24: 300.0000 mg | ORAL_TABLET | Freq: Every day | ORAL | 3 refills | Status: DC

## 2023-11-14 NOTE — Patient Instructions (Addendum)
 Please return in 3-6 months for recheck.  If you have any questions or concerns, please don't hesitate to send me a message via MyChart or call the office at [phone]. Thank you for visiting with us  today! It's our pleasure caring for you.   VISIT SUMMARY:  During today's visit, we discussed several health concerns including your earache, carpal tunnel syndrome, diabetes management, mental health, and general health maintenance. We reviewed your current treatments and made adjustments where necessary to better manage your conditions.  YOUR PLAN:  -EARACHE: An earache can be caused by various factors including infections or referred pain from other areas like the throat. You have been experiencing ear pain for ten days and have been using peroxide and Listerine, which helped reduce inflammation. We will try a course of amoxicillin .   -HYPERTENSION: Hypertension, or high blood pressure, requires regular monitoring and management to prevent complications. Your blood pressure is high today which is unusual. We will continue to monitor your blood pressure closely.  -DIABETES MELLITUS TYPE 2: Type 2 diabetes is a condition where your body does not use insulin  properly, leading to high blood sugar levels. Your recent A1c of 6.5 indicates good control. Continue with your current medication and diet plan, but avoid sugary drinks like orange juice and stay hydrated with water .  -DEPRESSION: Depression is a mental health condition characterized by persistent feelings of sadness and loss of interest. Your current dose of bupropion  is not effective, so we will increase it to 300 mg once daily, which you have found helpful in the past.  -CARPAL TUNNEL SYNDROME: Carpal tunnel syndrome is a condition caused by pressure on the median nerve in the wrist, leading to pain and numbness. Since physical therapy and chiropractic treatments have not been effective, we will refer you to a hand surgeon for evaluation and  possible surgery.  -GENERAL HEALTH MAINTENANCE: It is important to keep up with routine health screenings and medications. We need to reschedule your colon cancer screening and will prescribe an inhaler and antihistamines as needed. You are compliant with other health maintenance activities such as vision care. We will see if can get help rescheduling the lung cancer screening test.   INSTRUCTIONS:  Please schedule a follow-up appointment in six months. Additionally, reschedule your colon cancer screening as soon as possible. If your earache persists, start the prescribed antibiotics. Continue monitoring your blood pressure and avoid sugary drinks to manage your diabetes effectively. We will increase your bupropion  dose to 300 mg once daily. Lastly, follow up with the hand surgeon for your carpal tunnel syndrome evaluation and possible surgery.

## 2023-11-14 NOTE — Progress Notes (Signed)
 Subjective  CC:  Chief Complaint  Patient presents with   Diabetes   Ear Pain    Pt stated that she has been having Rt ear pain for the past week. The pain has not gotten any worse    HPI: Corey Collins is a 63 y.o. adult who presents to the office today for follow up of diabetes and problems listed above in the chief complaint.  Discussed the use of AI scribe software for clinical note transcription with the patient, who gave verbal consent to proceed.  History of Present Illness   The patient, with a history of hypertension, diabetes, and mental health concerns, presents with an earache that has been ongoing for about ten days. The patient has been self-treating with peroxide and Listerine, which she reports has reduced inflammation. The patient denies any throat pain but mentions some previous sinus congestion. The patient also reports a feeling of imbalance after using Listerine in the ear, which resolved after shaking it out.  The patient also reports ongoing issues with carpal tunnel syndrome, which she feels has not improved despite physical therapy and chiropractic treatment. The patient expresses interest in surgical intervention for this issue.  The patient's diabetes appears to be well-controlled, with a recent A1c of 6.5. The patient reports adherence to her medication regimen, even during recent travels. However, the patient does admit to consuming a significant amount of orange juice recently, which she acknowledges is not ideal for her diabetes management. No foot concerns. No retinopathy. Has new glasses. No h/o HTN and not on ace. On statin for lipids which are controlled.   The patient also discusses her mental health, mentioning feelings of homicidal ideation due to stressors involving her mother's finances and neighbors. The patient quickly reassures that she would not act on these feelings. The patient also requests a higher dose of her mood medication, bupropion , as  she feels the current dose is not effective.  The patient also mentions a history of smoking, currently about half a pack a day, and a recent incident where she unknowingly consumed a drink laced with cocaine. She was scheduled for lung cancer screening ct but says they canceled the test and it has not been rescheduled. HM: due for shingrix  and missed appt at pharmacy for this. Is willing to get it.      Wt Readings from Last 3 Encounters:  11/14/23 172 lb 12.8 oz (78.4 kg)  08/01/23 174 lb 12.8 oz (79.3 kg)  02/07/23 171 lb (77.6 kg)    BP Readings from Last 3 Encounters:  11/14/23 (!) 162/90  08/01/23 120/70  02/07/23 110/78    Assessment  1. Controlled type 2 diabetes mellitus without complication, without long-term current use of insulin  (HCC)   2. GAD (generalized anxiety disorder)   3. Major depression, chronic   4. Cigarette nicotine dependence without complication   5. Screening for colon cancer   6. Encounter for screening for lung cancer   7. Acute otalgia, right   8. Elevated blood pressure reading without diagnosis of hypertension   9. 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 Bilateral carpal tunnel syndrome      Plan  Assessment and Plan    Otalgia Intermittent ear pain for ten days, treated with peroxide and Listerine. Possible serous effusion, mouth infection, lymphadenitis or other .Possible referred pain from the throat. Prefers non-antibiotic treatments but agrees to antibiotics if symptoms persist. - Prescribe antibiotic: amox 875 bid x  7 days.   Hypertension Reported elevated blood pressure. Requires monitoring and management. - Monitor blood pressure  Diabetes Mellitus Type 2 A1c is 6.5, indicating good control. Compliant with medication and diet, with minor lapse during travel. Advised to avoid sugary drinks and hydrate with water . - Continue current diabetes management plan - Advise against consuming sugary  drinks - Encourage hydration with water   Depression Current dose of bupropion  is ineffective. Prefers higher dose (300 mg) which was previously effective. Acknowledges medication is helpful despite not liking it. - Prescribe bupropion  xl 300 mg once daily  Carpal Tunnel Syndrome Chronic bilateral carpal tunnel syndrome. Requests referral for surgical evaluation as chiropractic treatment has been ineffective. Prefers surgical intervention. - Refer to hand surgeon for evaluation and possible surgery  Smoker: albuterol  refills are available  General Health Maintenance Needs to do cologuard; reports has kit at home. Lung cancer screening CT Needs rescheduling. Requires inhaler and antihistamines. Compliant with other health maintenance activities such as vision care. - Refer for care management to assist.  - Prescribe inhaler - Prescribe antihistamines  Follow-up - Schedule follow-up appointment in six months.     I spent a total of 50 minutes for this patient encounter. Time spent included preparation, face-to-face counseling with the patient and coordination of care, review of chart and records, and documentation of the encounter.  Orders Placed This Encounter  Procedures   AMB Referral VBCI Care Management   Ambulatory referral to Hand Surgery   POCT HgB A1C   Meds ordered this encounter  Medications   buPROPion  (WELLBUTRIN  XL) 300 MG 24 hr tablet    Sig: Take 1 tablet (300 mg total) by mouth daily.    Dispense:  90 tablet    Refill:  3   amoxicillin  (AMOXIL ) 875 MG tablet    Sig: Take 1 tablet (875 mg total) by mouth 2 (two) times daily for 7 days.    Dispense:  14 tablet    Refill:  0      Immunization History  Administered Date(s) Administered   Influenza, Seasonal, Injecte, Preservative Fre 08/01/2023   Influenza,inj,Quad PF,6+ Mos 10/13/2017, 08/01/2019, 08/09/2022   Pneumococcal Polysaccharide-23 05/12/2017   Rabies, IM 07/11/2012   Rabies, intradermal 07/11/2012    Tdap 07/11/2012    Diabetes Related Lab Review: Lab Results  Component Value Date   HGBA1C 6.5 (A) 11/14/2023   HGBA1C 6.9 (H) 08/01/2023   HGBA1C 7.2 (H) 02/07/2023    Lab Results  Component Value Date   MICROALBUR 0.9 08/01/2023   Lab Results  Component Value Date   CREATININE 0.58 08/01/2023   BUN 14 08/01/2023   NA 138 08/01/2023   K 4.1 08/01/2023   CL 102 08/01/2023   CO2 25 08/01/2023   Lab Results  Component Value Date   CHOL 156 08/01/2023   CHOL 128 02/07/2023   CHOL 106 02/03/2022   Lab Results  Component Value Date   HDL 55.80 08/01/2023   HDL 47.70 02/07/2023   HDL 32.60 (L) 02/03/2022   Lab Results  Component Value Date   LDLCALC 63 08/01/2023   LDLCALC 61 02/07/2023   LDLCALC 56 02/03/2022   Lab Results  Component Value Date   TRIG 186.0 (H) 08/01/2023   TRIG 94.0 02/07/2023   TRIG 87.0 02/03/2022   Lab Results  Component Value Date   CHOLHDL 3 08/01/2023   CHOLHDL 3 02/07/2023   CHOLHDL 3 02/03/2022   Lab Results  Component Value Date   LDLDIRECT 103.0 08/01/2019  The 10-year ASCVD risk score (Arnett DK, et al., 2019) is: 28.5%   Values used to calculate the score:     Age: 23 years     Sex: Male     Is Non-Hispanic African American: No     Diabetic: Yes     Tobacco smoker: Yes     Systolic Blood Pressure: 162 mmHg     Is BP treated: No     HDL Cholesterol: 55.8 mg/dL     Total Cholesterol: 156 mg/dL I have reviewed the PMH, Fam and Soc history. Patient Active Problem List   Diagnosis Date Noted Date Diagnosed   Cervical radiculopathy due to degenerative joint disease of spine 09/29/2020     Priority: High    xrays 2020, cervical spine MRI 2021; [facility]    Carpal tunnel syndrome on right 09/29/2020     Priority: High    Severe by emg, 2021, [facility]    Combined hyperlipidemia associated with type 2 diabetes mellitus (HCC) 02/27/2020     Priority: High    Simvastatin  20mg  qhs    Controlled type 2 diabetes  mellitus without complication, without long-term current use of insulin  (HCC) 02/26/2019     Priority: High    Metformin  1000 bid    GAD (generalized anxiety disorder) 10/11/2017     Priority: High    Managed by psychiatry    Allergic asthma 12/15/2016     Priority: High   Cigarette nicotine dependence without complication 12/15/2016     Priority: High   Major depression, chronic 12/15/2016     Priority: High    Managed by psychiatry    Male-to-male transgender person 12/15/2016     Priority: High    Overview:  2010 transitioned. No hormone use now.     PTSD (post-traumatic stress disorder) 12/15/2016     Priority: High   Ulnar neuropathy of right upper extremity 09/29/2020     Priority: Medium    S/P right THA, AA 05/24/2017     Priority: Medium    S/P left THA, AA 02/01/2017     Priority: Medium    Overweight (BMI 25.0-29.9) 05/25/2017     Social History: Patient  reports that she has been smoking cigarettes. She started smoking about 19 years ago. She has a 9.8 pack-year smoking history. She has never used smokeless tobacco. She reports current drug use. Drug: Marijuana. She reports that she does not drink alcohol.  Review of Systems: Ophthalmic: negative for eye pain, loss of vision or double vision Cardiovascular: negative for chest pain Respiratory: negative for SOB or persistent cough Gastrointestinal: negative for abdominal pain Genitourinary: negative for dysuria or gross hematuria MSK: negative for foot lesions Neurologic: negative for weakness or gait disturbance  Objective  Vitals: BP (!) 162/90   Pulse 70   Ht 5' 9 (1.753 m)   Wt 172 lb 12.8 oz (78.4 kg)   SpO2 94%   BMI 25.52 kg/m  General: well appearing, no acute distress , disheveled. HEENT:  Normocephalic, atraumatic, moist mucous membranes, supple neck , clear op. TM bil normal, nontender pinna Cardiovascular:  Nl S1 and S2, RRR without murmur, gallop or rub. no edema Respiratory:  Good  breath sounds bilaterally, CTAB with normal effort, no rales  Diabetic education: ongoing education regarding chronic disease management for diabetes was given today. We continue to reinforce the ABC's of diabetic management: A1c (<7 or 8 dependent upon patient), tight blood pressure control, and cholesterol management with goal LDL <  100 minimally. We discuss diet strategies, exercise recommendations, medication options and possible side effects. At each visit, we review recommended immunizations and preventive care recommendations for diabetics and stress that good diabetic control can prevent other problems. See below for this patient's data.   Commons side effects, risks, benefits, and alternatives for medications and treatment plan prescribed today were discussed, and the patient expressed understanding of the given instructions. Patient is instructed to call or message via MyChart if he/she has any questions or concerns regarding our treatment plan. No barriers to understanding were identified. We discussed Red Flag symptoms and signs in detail. Patient expressed understanding regarding what to do in case of urgent or emergency type symptoms.  Medication list was reconciled, printed and provided to the patient in AVS. Patient instructions and summary information was reviewed with the patient as documented in the AVS. This note was prepared with assistance of Dragon voice recognition software. Occasional wrong-word or sound-a-like substitutions may have occurred due to the inherent limitations of voice recognition software

## 2023-11-15 ENCOUNTER — Telehealth: Payer: Self-pay | Admitting: *Deleted

## 2023-11-15 NOTE — Progress Notes (Signed)
 Complex Care Management Note Care Guide Note  11/15/2023 Name: Corey Collins MRN: [id number] DOB: [birth year]   Complex Care Management Outreach Attempts: An unsuccessful telephone outreach was attempted today to offer the patient information about available complex care management services.  Follow Up Plan:  Additional outreach attempts will be made to offer the patient complex care management information and services.   Encounter Outcome:  No Answer  Thedford Franks, CCMA Care Coordination Care Guide Direct Dial: [phone]

## 2023-11-15 NOTE — Progress Notes (Signed)
 Complex Care Management Note  Care Guide Note 11/15/2023 Name: BHARGAV BARBARO MRN: [id number] DOB: 1960-12-13  MARION ROSENBERRY is a 63 y.o. year old adult who sees Jodie Lavern CROME, MD for primary care. I reached out to Lonni JONELLE Conn by phone today to offer complex care management services.  Ms. Melhorn was given information about Complex Care Management services today including:   The Complex Care Management services include support from the care team which includes your Nurse Coordinator, Clinical Social Worker, or Pharmacist.  The Complex Care Management team is here to help remove barriers to the health concerns and goals most important to you. Complex Care Management services are voluntary, and the patient may decline or stop services at any time by request to their care team member.   Complex Care Management Consent Status: Patient did not agree to participate in complex care management services at this time.  Follow up plan:  pt declined need for SW services or resources at this time - says he was looking for referral to [facility] which has already been placed.   Thedford Franks, CCMA Care Coordination Care Guide Direct Dial: [phone]    Encounter Outcome:  Patient Refused

## 2023-12-02 ENCOUNTER — Other Ambulatory Visit: Payer: Self-pay | Admitting: Family Medicine

## 2023-12-02 ENCOUNTER — Telehealth: Payer: Self-pay

## 2023-12-02 NOTE — Telephone Encounter (Signed)
Copied from CRM [id number]. Topic: Clinical - Medication Refill >> Dec 02, 2023 12:40 PM Corin V wrote: Most Recent Primary Care Visit:  Provider: [name]  Department: [facility]  Visit Type: OFFICE VISIT  Date: 11/14/2023  Medication: albuterol (VENTOLIN HFA) 108 (90 Base) MCG/ACT inhaler  Has the patient contacted their pharmacy? Yes- no refills remaining (Agent: If no, request that the patient contact the pharmacy for the refill. If patient does not wish to contact the pharmacy document the reason why and proceed with request.) (Agent: If yes, when and what did the pharmacy advise?)  Is this the correct pharmacy for this prescription? Yes If no, delete pharmacy and type the correct one.  This is the patient's preferred pharmacy:  [facility] [id number] [place], [place] - [address] [place] [address] Phone: [phone] Fax: [phone]   Has the prescription been filled recently? No  Is the patient out of the medication? No  Has the patient been seen for an appointment in the last year OR does the patient have an upcoming appointment? Yes  Can we respond through MyChart? No  Agent: Please be advised that Rx refills may take up to 3 business days. We ask that you follow-up with your pharmacy.

## 2023-12-02 NOTE — Telephone Encounter (Signed)
Copied from CRM [id number]. Topic: Clinical - Medical Advice >> Dec 02, 2023 12:44 PM Corin V wrote: Reason for CRM: Patient called in to get a refill on their albuterol inhaler and said they were not feeling well. Agent asked if patient was having any shortness of breath or tightness in chest as it is a breathing medication. Patient stated "No not really. Well sort of, I guess.". Agent asked if patient would be okay to be transferred to a nurse due to symptoms. Patient stated they did not want to speak to anyone as it increases symptoms and was just wanting their inhaler refilled today so they could get relief from it due to their cats causing allergic symptoms.  Inhaler has been sent to pharmacy

## 2023-12-06 ENCOUNTER — Other Ambulatory Visit (INDEPENDENT_AMBULATORY_CARE_PROVIDER_SITE_OTHER): Payer: Medicare (Managed Care)

## 2023-12-06 ENCOUNTER — Ambulatory Visit: Payer: Medicare (Managed Care) | Admitting: Orthopedic Surgery

## 2023-12-06 DIAGNOSIS — M25531 Pain in right wrist: Secondary | ICD-10-CM

## 2023-12-06 DIAGNOSIS — M25532 Pain in left wrist: Secondary | ICD-10-CM

## 2023-12-06 NOTE — Progress Notes (Signed)
Corey Collins - 63 y.o. adult MRN [id number]  Date of birth: 1961-02-10  Office Visit Note: Visit Date: 12/06/2023 PCP: [name], MD Referred by: [name], MD  Subjective: No chief complaint on file.  HPI: Corey Collins is a pleasant 63 y.o. adult who presents today for evaluation of bilateral wrist pain, right slightly greater than left.  Symptoms have been present now for multiple years.  Patient has not undergone any previous treatment for this.  Denies any significant numbness or tingling.  Pertinent ROS were reviewed with the patient and found to be negative unless otherwise specified above in HPI.   Visit Reason: bilateral wrist pain Duration of symptoms: years Hand dominance: right Occupation: disabled Diabetic: yes/ 6.5 Smoking: Yes Heart/Lung History: none Blood Thinners: none  Prior Testing/EMG: none Injections (Date): none Treatments: none Prior Surgery: none  Assessment & Plan: Visit Diagnoses:  1. Bilateral wrist pain     Plan: Extensive discussion was had with the patient today regarding the bilateral wrist complaints.  There is evidence of significant arthritic change at the radiocarpal articulations bilaterally, particularly at the radial scaphoid intervals.  There is essentially bone-on-bone arthritis at these regions which is causing significant pain.  We discussed treatment modalities ranging from conservative to surgical.  From a conservative standpoint, we discussed therapy in order to maximize range of motion and function of the hands, anti-inflammatory medication both topical and oral, bracing as well as cortisone injections for symptom relief.  From a surgical standpoint, we discussed the significance of the arthritic change at these levels and the likelihood for necessity of salvage procedure such as potential proximal row carpectomy in the future for pain control.  The risk and benefits of the surgery were discussed as  well.  At this juncture, given that no treatment modalities have been tried yet.  We will begin with bilateral wrist bracing and occupational therapy for symptom relief.  Patient can return in approximately 6 weeks time for recheck on how they are doing with the bracing and therapy.  At that juncture, we could consider potential cortisone injections to help augment symptom relief.  Follow-up: No follow-ups on file.   Meds & Orders: No orders of the defined types were placed in this encounter.   Orders Placed This Encounter  Procedures   XR Wrist Complete Left   XR Wrist Complete Right   Ambulatory referral to Occupational Therapy     Procedures: No procedures performed      Clinical History: No specialty comments available.  She reports that she has been smoking cigarettes. She started smoking about 19 years ago. She has a 9.8 pack-year smoking history. She has never used smokeless tobacco.  Recent Labs    02/07/23 1601 08/01/23 1453 11/14/23 1555  HGBA1C 7.2* 6.9* 6.5*    Objective:   Vital Signs: There were no vitals taken for this visit.  Physical Exam  Gen: Well-appearing, in no acute distress; non-toxic CV: Regular Rate. Well-perfused. Warm.  Resp: Breathing unlabored on room air; no wheezing. Psych: Fluid speech in conversation; appropriate affect; normal thought process  Ortho Exam PHYSICAL EXAM:  General: Patient is well appearing and in no distress. Cervical spine mobility is full in all directions:  Skin and Muscle: No significant skin changes are apparent to upper extremities.  Muscle bulk and contour normal, no signs of atrophy.     Range of Motion and Palpation Tests: Mobility is full about the elbows with flexion and extension.  Forearm supination and pronation are 85/85 bilaterally.  Wrist flexion/extension is 55/40 bilaterally.  Digital flexion and extension are full.  Thumb opposition is full to the base of the small fingers bilaterally.    No cords  or nodules are palpated.  No triggering is observed.    Pain elicited with scaphoid shift testing bilaterally, no significant clunk appreciated.  Notable tenderness at the scapholunate interval bilaterally.  Ulnar impingement test is negative bilaterally.  No evidence of radiocarpal, midcarpal or intercarpal joint instability with provocation bilaterally.  Neurologic, Vascular, Motor: Sensation is intact to light touch in the median/radial/ulnar distributions.  Fingers pink and well perfused.  Capillary refill is brisk.      Lab Results  Component Value Date   HGBA1C 6.5 (A) 11/14/2023     Imaging: XR Wrist Complete Left Result Date: 12/06/2023 X-rays of the left wrist, multiple views were obtained today X-rays demonstrate significant degenerative change at the radiocarpal interval, particularly at the radial scaphoid articulation.  Cystic changes are appreciated at the scaphoid as well.  XR Wrist Complete Right Result Date: 12/06/2023 X-rays of the right wrist, multiple views were obtained today X-rays demonstrate significant degenerative change at the radiocarpal interval, particularly at the radial scaphoid articulation.  There is notable scalloping of the distal radius at the scaphoid facet.   Past Medical/Family/Surgical/Social History: Medications & Allergies reviewed per EMR, new medications updated. Patient Active Problem List   Diagnosis Date Noted   Cervical radiculopathy due to degenerative joint disease of spine 09/29/2020   Carpal tunnel syndrome on right 09/29/2020   Ulnar neuropathy of right upper extremity 09/29/2020   Combined hyperlipidemia associated with type 2 diabetes mellitus (HCC) 02/27/2020   Controlled type 2 diabetes mellitus without complication, without long-term current use of insulin (HCC) 02/26/2019   GAD (generalized anxiety disorder) 10/11/2017   Overweight (BMI 25.0-29.9) 05/25/2017   S/P right THA, AA 05/24/2017   S/P left THA, AA 02/01/2017    Allergic asthma 12/15/2016   Cigarette nicotine dependence without complication 12/15/2016   Major depression, chronic 12/15/2016   Male-to-male transgender person 12/15/2016   PTSD (post-traumatic stress disorder) 12/15/2016   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   xrays 2020   DVT (deep venous thrombosis) (HCC) 2009   L calf   Headache    sinus   History of kidney stones    Pneumonia age 34 or 59   Prediabetes 02/21/2018   On metformin   Primary osteoarthritis of right hip 12/15/2016   PTSD (post-traumatic stress disorder)    Puncture wound    2010 left lower leg    Seasonal allergies    Family History  Problem Relation Age of Onset   Coronary artery disease Father    Hypertension Other    Diabetes Other    Past Surgical History:  Procedure Laterality Date   APPENDECTOMY     BOWEL RESECTION  02/04/2018   Procedure: SMALL BOWEL RESECTION;  Surgeon: Berna Bue, MD;  Location: [facility];  Service: General;;   HERNIA REPAIR     INGUINAL HERNIA REPAIR Right 02/04/2018   Procedure: HERNIA REPAIR INGUINAL ADULT;  Surgeon: Berna Bue, MD;  Location: [facility];  Service: General;  Laterality: Right;   LAPAROTOMY  02/04/2018  Procedure: EXPLORATORY LAPAROTOMY;  Surgeon: Berna Bue, MD;  Location: [facility];  Service: General;;   NASAL FRACTURE SURGERY     TOTAL HIP ARTHROPLASTY Left 02/01/2017   Procedure: LEFT TOTAL HIP ARTHROPLASTY ANTERIOR APPROACH;  Surgeon: Durene Romans, MD;  Location: [facility];  Service: Orthopedics;  Laterality: Left;   TOTAL HIP ARTHROPLASTY Right 05/24/2017   Procedure: RIGHT TOTAL HIP ARTHROPLASTY ANTERIOR APPROACH;  Surgeon: Durene Romans, MD;  Location: [facility];  Service: Orthopedics;  Laterality: Right;   Social History    Occupational History   Occupation: disabled due to PTSD, former professor history  Tobacco Use   Smoking status: Some Days    Current packs/day: 0.00    Average packs/day: 0.8 packs/day for 13.0 years (9.8 ttl pk-yrs)    Types: Cigarettes    Start date: 01/07/2004    Last attempt to quit: 01/06/2017    Years since quitting: 6.9   Smokeless tobacco: Never   Tobacco comments:    Variable smoking habits: about 6-7 pak per year in the 80s. Then quit for 20 years; now smoking again since 2010, approx 0.75pak per day on avg; now closer to 1/2 pak per day.  Vaping Use   Vaping status: Never Used  Substance and Sexual Activity   Alcohol use: No   Drug use: Yes    Types: Marijuana    Comment: occ marijuana use, last used 2 weeks ago   Sexual activity: Yes    Birth control/protection: None    Shaine [place]ara Boros) Pratham Cassatt, M.D. [facility] 10:08 AM

## 2023-12-14 ENCOUNTER — Ambulatory Visit: Payer: Medicare (Managed Care)

## 2023-12-14 VITALS — Wt 172.0 lb

## 2023-12-14 DIAGNOSIS — Z1211 Encounter for screening for malignant neoplasm of colon: Secondary | ICD-10-CM

## 2023-12-14 DIAGNOSIS — Z139 Encounter for screening, unspecified: Secondary | ICD-10-CM

## 2023-12-14 DIAGNOSIS — Z0000 Encounter for general adult medical examination without abnormal findings: Secondary | ICD-10-CM | POA: Diagnosis not present

## 2023-12-14 NOTE — Progress Notes (Addendum)
 Subjective:   Corey Collins is a 63 y.o. male who presents for Medicare Annual/Subsequent preventive examination.  Visit Complete: Virtual I connected with  Corey Collins on 12/14/23 by a audio enabled telemedicine application and verified that I am speaking with the correct person using two identifiers.  Patient Location: Home  Provider Location: Home Office  I discussed the limitations of evaluation and management by telemedicine. The patient expressed understanding and agreed to proceed.  Vital Signs: Because this visit was a virtual/telehealth visit, some criteria may be missing or patient reported. Any vitals not documented were not able to be obtained and vitals that have been documented are patient reported.  Cardiac Risk Factors include: advanced age (>60men, >54 women);diabetes mellitus;dyslipidemia;obesity (BMI >30kg/m2);smoking/ tobacco exposure     Objective:    Today's Vitals   12/14/23 1403  Weight: 172 lb (78 kg)   Body mass index is 25.4 kg/m.     12/14/2023    2:07 PM 10/14/2022    3:23 PM 02/05/2018    8:10 AM 05/24/2017    1:25 PM 05/24/2017    5:49 AM 05/17/2017   10:47 AM 02/01/2017    1:36 PM  Advanced Directives  Does Patient Have a Medical Advance Directive? No No No No No No No  Would patient like information on creating a medical advance directive? No - Patient declined No - Patient declined No - Patient declined No - Patient declined No - Patient declined No - Patient declined No - Patient declined    Current Medications (verified) Outpatient Encounter Medications as of 12/14/2023  Medication Sig   albuterol  (VENTOLIN  HFA) 108 (90 Base) MCG/ACT inhaler INHALE 2 PUFFS BY MOUTH EVERY 4 HOURS AS NEEDED FOR WHEEZING OR SHORTNESS OF BREATH   ALPRAZolam  (XANAX ) 0.5 MG tablet Take 1 tablet (0.5 mg total) by mouth at bedtime as needed for anxiety.   buPROPion  (WELLBUTRIN  XL) 300 MG 24 hr tablet Take 1 tablet (300 mg total) by mouth daily.    Cholecalciferol (VITAMIN D3 GUMMIES ADULT PO) Take 2 each by mouth daily.   Coenzyme Q10 (COQ-10) 50 MG CAPS Take 50 mg by mouth daily.    hydrOXYzine  (VISTARIL ) 25 MG capsule Take 1 capsule (25 mg total) by mouth at bedtime.   ketoconazole  (NIZORAL ) 2 % shampoo Apply 1 Application topically 2 (two) times a week.   metFORMIN  (GLUCOPHAGE ) 1000 MG tablet Take 1 tablet (1,000 mg total) by mouth 2 (two) times daily with a meal.   Omega-3 Fatty Acids (FISH OIL) 1000 MG CAPS Take 1,000 mg by mouth daily.    polyethylene glycol powder (GLYCOLAX /MIRALAX ) 17 GM/SCOOP powder Take 17 g by mouth 2 (two) times daily as needed.   prazosin  (MINIPRESS ) 1 MG capsule Take 1 capsule (1 mg total) by mouth at bedtime.   Prenatal Vit-Min-FA-Fish Oil ([employer] PRENATAL GUMMY PO) Take 2 tablets by mouth daily.   simvastatin  (ZOCOR ) 20 MG tablet Take 1 tablet (20 mg total) by mouth at bedtime.   triamcinolone  cream (KENALOG ) 0.1 % Apply 1 Application topically 2 (two) times daily. For 2 weeks, then as needed   No facility-administered encounter medications on file as of 12/14/2023.    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   DJD (degenerative joint disease) of  cervical spine 09/29/2020   xrays 2020   DVT (deep venous thrombosis) (HCC) 2009   L calf   Headache    sinus   History of kidney stones    Pneumonia age 63 or 16   Prediabetes 02/21/2018   On metformin    Primary osteoarthritis of right hip 12/15/2016   PTSD (post-traumatic stress disorder)    Puncture wound    2010 left lower leg    Seasonal allergies    Past Surgical History:  Procedure Laterality Date   APPENDECTOMY     BOWEL RESECTION  02/04/2018   Procedure: SMALL BOWEL RESECTION;  Surgeon: Signe Mitzie LABOR, MD;  Location: [facility];  Service: General;;    HERNIA REPAIR     INGUINAL HERNIA REPAIR Right 02/04/2018   Procedure: HERNIA REPAIR INGUINAL ADULT;  Surgeon: Signe Mitzie LABOR, MD;  Location: [facility];  Service: General;  Laterality: Right;   LAPAROTOMY  02/04/2018   Procedure: EXPLORATORY LAPAROTOMY;  Surgeon: Signe Mitzie LABOR, MD;  Location: [facility];  Service: General;;   NASAL FRACTURE SURGERY     TOTAL HIP ARTHROPLASTY Left 02/01/2017   Procedure: LEFT TOTAL HIP ARTHROPLASTY ANTERIOR APPROACH;  Surgeon: Donnice Car, MD;  Location: [facility];  Service: Orthopedics;  Laterality: Left;   TOTAL HIP ARTHROPLASTY Right 05/24/2017   Procedure: RIGHT TOTAL HIP ARTHROPLASTY ANTERIOR APPROACH;  Surgeon: Car Donnice, MD;  Location: [facility];  Service: Orthopedics;  Laterality: Right;   Family History  Problem Relation Age of Onset   Coronary artery disease Father    Hypertension Other    Diabetes Other    Social History   Socioeconomic History   Marital status: Divorced    Spouse name: Not on file   Number of children: Not on file   Years of education: Not on file   Highest education level: Not on file  Occupational History   Occupation: disabled due to PTSD, former professor history  Tobacco Use   Smoking status: Some Days    Current packs/day: 0.00    Average packs/day: 0.8 packs/day for 13.0 years (9.8 ttl pk-yrs)    Types: Cigarettes    Start date: 01/07/2004    Last attempt to quit: 01/06/2017    Years since quitting: 6.9   Smokeless tobacco: Never   Tobacco comments:    Variable smoking habits: about 6-7 pak per year in the 80s. Then quit for 20 years; now smoking again since 2010, approx 0.75pak per day on avg; now closer to 1/2 pak per day.  Vaping Use   Vaping status: Never Used  Substance and Sexual Activity   Alcohol use: No   Drug use: Yes    Types: Marijuana    Comment: occ marijuana use, last used 2 weeks ago   Sexual activity: Yes    Birth control/protection: None  Other Topics Concern   Not on file  Social History  Narrative   Not on file   Social Drivers of Health   Financial Resource Strain: Low Risk  (12/14/2023)   Overall Financial Resource Strain (CARDIA)    Difficulty of Paying Living Expenses: Not hard at all  Food Insecurity: No Food Insecurity (12/14/2023)   Hunger Vital Sign    Worried About Running Out of Food in the Last Year: Never true    Ran Out of Food in the Last Year: Never true  Transportation Needs: No Transportation Needs (12/14/2023)   PRAPARE - Administrator, Civil Service (Medical): No  Lack of Transportation (Non-Medical): No  Physical Activity: Insufficiently Active (12/14/2023)   Exercise Vital Sign    Days of Exercise per Week: 5 days    Minutes of Exercise per Session: 10 min  Stress: No Stress Concern Present (12/14/2023)   [employer] of Occupational Health - Occupational Stress Questionnaire    Feeling of Stress : Not at all  Social Connections: Socially Isolated (12/14/2023)   Social Connection and Isolation Panel [NHANES]    Frequency of Communication with Friends and Family: Once a week    Frequency of Social Gatherings with Friends and Family: Once a week    Attends Religious Services: Never    Database Administrator or Organizations: No    Attends Engineer, Structural: Never    Marital Status: Divorced    Tobacco Counseling Ready to quit: Not Answered Counseling given: Not Answered Tobacco comments: Variable smoking habits: about 6-7 pak per year in the 80s. Then quit for 20 years; now smoking again since 2010, approx 0.75pak per day on avg; now closer to 1/2 pak per day.   Clinical Intake:  Pre-visit preparation completed: Yes  Pain : No/denies pain     BMI - recorded: 25.4 Nutritional Status: BMI 25 -29 Overweight Nutritional Risks: None Diabetes: Yes CBG done?: No Did pt. bring in CBG monitor from home?: No  How often do you need to have someone help you when you read instructions, pamphlets, or other written  materials from your doctor or pharmacy?: 1 - Never  Interpreter Needed?: No  Information entered by :: Ellouise Haws, LPN   Activities of Daily Living    12/14/2023    2:05 PM  In your present state of health, do you have any difficulty performing the following activities:  Hearing? 0  Vision? 0  Difficulty concentrating or making decisions? 0  Walking or climbing stairs? 0  Dressing or bathing? 0  Doing errands, shopping? 0  Preparing Food and eating ? N  Using the Toilet? N  In the past six months, have you accidently leaked urine? N  Do you have problems with loss of bowel control? N  Managing your Medications? N  Managing your Finances? N  Housekeeping or managing your Housekeeping? N    Patient Care Team: Jodie Lavern CROME, MD as PCP - General (Family Medicine) Ernie Cough, MD as Consulting Physician (Orthopedic Surgery) Burnetta Aures, MD as Consulting Physician (Orthopedic Surgery)  Indicate any recent Medical Services you may have received from other than [facility] providers in the past year (date may be approximate).     Assessment:   This is a routine wellness examination for [place].  Hearing/Vision screen Hearing Screening - Comments:: Pt denies any hearing issues  Vision Screening - Comments:: Pt will follows up with provider at [facility] [address]   Goals Addressed             This Visit's Progress    Patient Stated       Lose 5 lbs        Depression Screen    12/14/2023    2:12 PM 11/14/2023    3:48 PM 08/01/2023    2:01 PM 02/07/2023    3:31 PM 10/14/2022    3:18 PM 08/09/2022    3:12 PM 02/03/2022    4:09 PM  PHQ 2/9 Scores  PHQ - 2 Score 0 0 0 0 1 0 0  PHQ- 9 Score     4  6    Fall  Risk    12/14/2023    2:15 PM 11/14/2023    3:48 PM 08/01/2023    2:01 PM 02/07/2023    3:30 PM 10/14/2022    3:25 PM  Fall Risk   Falls in the past year? 0 0 1 0 0  Number falls in past yr: 0 0 0 0 0  Injury with Fall? 0 0 0 0 0  Risk for fall due to : No Fall Risks  No Fall Risks No Fall Risks No Fall Risks   Follow up Falls prevention discussed;Falls evaluation completed Falls evaluation completed Falls evaluation completed Falls evaluation completed Falls prevention discussed    MEDICARE RISK AT HOME: Medicare Risk at Home Any stairs in or around the home?: No If so, are there any without handrails?: No Home free of loose throw rugs in walkways, pet beds, electrical cords, etc?: Yes Adequate lighting in your home to reduce risk of falls?: Yes Life alert?: No Use of a cane, walker or w/c?: No Grab bars in the bathroom?: No Shower chair or bench in shower?: No Elevated toilet seat or a handicapped toilet?: No  TIMED UP AND GO:  Was the test performed?  No    Cognitive Function:        12/14/2023    2:17 PM 10/14/2022    3:27 PM  6CIT Screen  What Year? 0 points 0 points  What month? 0 points 0 points  What time? 0 points 0 points  Count back from 20 0 points 0 points  Months in reverse 0 points 0 points  Repeat phrase 0 points 0 points  Total Score 0 points 0 points    Immunizations Immunization History  Administered Date(s) Administered   Influenza, Seasonal, Injecte, Preservative Fre 08/01/2023   Influenza,inj,Quad PF,6+ Mos 10/13/2017, 08/01/2019, 08/09/2022   Pneumococcal Polysaccharide-23 05/12/2017   Rabies, IM 07/11/2012   Rabies, intradermal 07/11/2012   Tdap 07/11/2012      Flu Vaccine status: Up to date  Pneumococcal vaccine status: Due, Education has been provided regarding the importance of this vaccine. Advised may receive this vaccine at local pharmacy or Health Dept. Aware to provide a copy of the vaccination record if obtained from local pharmacy or Health Dept. Verbalized acceptance and understanding.  Covid-19 vaccine status: Information provided on how to obtain vaccines.   Qualifies for Shingles Vaccine? Yes   Zostavax completed No   Shingrix  Completed?: No.    Education has been provided regarding the  importance of this vaccine. Patient has been advised to call insurance company to determine out of pocket expense if they have not yet received this vaccine. Advised may also receive vaccine at local pharmacy or Health Dept. Verbalized acceptance and understanding.  Screening Tests Health Maintenance  Topic Date Due   COVID-19 Vaccine (1) Never done   Zoster Vaccines- Shingrix  (1 of 2) Never done   Fecal DNA (Cologuard)  Never done   Pneumococcal Vaccine 56-47 Years old (2 of 2 - PCV) 05/12/2018   FOOT EXAM  05/04/2023   HEMOGLOBIN A1C  05/13/2024   Diabetic kidney evaluation - eGFR measurement  07/31/2024   Diabetic kidney evaluation - Urine ACR  07/31/2024   OPHTHALMOLOGY EXAM  10/30/2024   Medicare Annual Wellness (AWV)  12/13/2024   INFLUENZA VACCINE  Completed   Hepatitis C Screening  Completed   HIV Screening  Completed   HPV VACCINES  Aged Out   DTaP/Tdap/Td  Discontinued    Health Maintenance  Health Maintenance Due  Topic Date Due   COVID-19 Vaccine (1) Never done   Zoster Vaccines- Shingrix  (1 of 2) Never done   Fecal DNA (Cologuard)  Never done   Pneumococcal Vaccine 62-71 Years old (2 of 2 - PCV) 05/12/2018   FOOT EXAM  05/04/2023    Colorectal cancer screening: Referral to GI placed 12/14/23. Pt aware the office will call re: appt.  Lung Cancer Screening: (Low Dose CT Chest recommended if Age 26-80 years, 20 pack-year currently smoking OR have quit w/in 15years.) does qualify.   Lung Cancer Screening Referral: order placed 12/14/23  Additional Screening:  Hepatitis C Screening: Completed 10/13/17  Vision Screening: Recommended annual ophthalmology exams for early detection of glaucoma and other disorders of the eye. Is the patient up to date with their annual eye exam?  Yes  Who is the provider or what is the name of the office in which the patient attends annual eye exams? [facility] [address] If pt is not established with a provider, would they like to be referred to  a provider to establish care? No .   Dental Screening: Recommended annual dental exams for proper oral hygiene  Diabetic Foot Exam: Diabetic Foot Exam: Overdue, Pt has been advised about the importance in completing this exam. Pt is scheduled for diabetic foot exam on next appt .  Community Resource Referral / Chronic Care Management: CRR required this visit?  No   CCM required this visit?  No     Plan:     I have personally reviewed and noted the following in the patient's chart:   Medical and social history Use of alcohol, tobacco or illicit drugs  Current medications and supplements including opioid prescriptions. Patient is not currently taking opioid prescriptions. Functional ability and status Nutritional status Physical activity Advanced directives List of other physicians Hospitalizations, surgeries, and ER visits in previous 12 months Vitals Screenings to include cognitive, depression, and falls Referrals and appointments  In addition, I have reviewed and discussed with patient certain preventive protocols, quality metrics, and best practice recommendations. A written personalized care plan for preventive services as well as general preventive health recommendations were provided to patient.     Ellouise VEAR Haws, LPN   05/11/7973   After Visit Summary: (Mail) Due to this being a telephonic visit, the after visit summary with patients personalized plan was offered to patient via mail   Nurse Notes: none

## 2023-12-14 NOTE — Patient Instructions (Addendum)
 Ms. Corey Collins , Thank you for taking time to come for your Medicare Wellness Visit. I appreciate your ongoing commitment to your health goals. Please review the following plan we discussed and let me know if I can assist you in the future.   Referrals/Orders/Follow-Ups/Clinician Recommendations: Aim for 30 minutes of exercise or brisk walking, 6-8 glasses of water , and 5 servings of fruits and vegetables each day.  If you wish to quit smoking, help is available. For free tobacco cessation program offerings call the [facility] at 312-869-4502 or Live Well Line at 684 802 2388. You may also visit www.[place].com or email livelifewell@[place] .com for more information on other programs.   You may also call 1-800-QUIT-NOW (279 166 8218) or visit www.[employer].ch or www.BecomeAnEx.org for additional resources on smoking cessation.   [facility] [address] [place],  [place]  [address] Main: [phone]   This is a list of the screening recommended for you and due dates:  Health Maintenance  Topic Date Due   COVID-19 Vaccine (1) Never done   Zoster (Shingles) Vaccine (1 of 2) Never done   Cologuard (Stool DNA test)  Never done   Pneumococcal Vaccination (2 of 2 - PCV) 05/12/2018   Complete foot exam   05/04/2023   Hemoglobin A1C  05/13/2024   Yearly kidney function blood test for diabetes  07/31/2024   Yearly kidney health urinalysis for diabetes  07/31/2024   Eye exam for diabetics  10/30/2024   Medicare Annual Wellness Visit  12/13/2024   Flu Shot  Completed   Hepatitis C Screening  Completed   HIV Screening  Completed   HPV Vaccine  Aged Out   DTaP/Tdap/Td vaccine  Discontinued    Advanced directives: (Declined) Advance directive discussed with you today. Even though you declined this today, please call our office should you change your mind, and we can give you the proper paperwork for you to fill out.  Next  Medicare Annual Wellness Visit scheduled for next year: Yes

## 2023-12-15 ENCOUNTER — Other Ambulatory Visit: Payer: Self-pay | Admitting: Family Medicine

## 2023-12-20 ENCOUNTER — Encounter: Payer: Medicare (Managed Care) | Admitting: Rehabilitative and Restorative Service Providers"

## 2023-12-23 ENCOUNTER — Other Ambulatory Visit: Payer: Self-pay | Admitting: Family Medicine

## 2023-12-23 NOTE — Telephone Encounter (Signed)
Copied from CRM [id number]. Topic: Clinical - Medication Refill >> Dec 23, 2023  5:00 PM Sonny Dandy B wrote: Most Recent Primary Care Visit:  Provider: Marzella Schlein  Department: [facility]  Visit Type: MEDICARE AWV, SEQUENTIAL  Date: 12/14/2023  Medication: metFORMIN (GLUCOPHAGE) 1000 MG tablet   Has the patient contacted their pharmacy? Yes (Agent: If no, request that the patient contact the pharmacy for the refill. If patient does not wish to contact the pharmacy document the reason why and proceed with request.) (Agent: If yes, when and what did the pharmacy advise?)  Is this the correct pharmacy for this prescription? Yes If no, delete pharmacy and type the correct one.  This is the patient's preferred pharmacy:    [facility] [employer] [address] [place], [place] - [place], [place] - [address] [place] [address] Phone: [phone] Fax: [phone]   Has the prescription been filled recently? Yes  Is the patient out of the medication? Yes  Has the patient been seen for an appointment in the last year OR does the patient have an upcoming appointment? Yes  Can we respond through MyChart? Yes  Agent: Please be advised that Rx refills may take up to 3 business days. We ask that you follow-up with your pharmacy.

## 2023-12-23 NOTE — Telephone Encounter (Signed)
Last Fill: 09/13/23  Last OV: 12/14/23 Next OV: 02/13/24  Routing to provider for review/authorization.

## 2023-12-26 MED ORDER — METFORMIN HCL 1000 MG PO TABS
1000.0000 mg | ORAL_TABLET | Freq: Two times a day (BID) | ORAL | 3 refills | Status: DC
Start: 2023-12-26 — End: 2024-05-09

## 2023-12-28 NOTE — Therapy (Incomplete)
OUTPATIENT OCCUPATIONAL THERAPY ORTHO EVALUATION  Patient Name: Corey Collins MRN: [id number] DOB:1961-05-27, 63 y.o., adult Today's Date: 12/28/2023  PCP: Asencion Partridge, MD REFERRING PROVIDER:  Samuella Cota, MD    END OF SESSION:   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   xrays 2020   DVT (deep venous thrombosis) (HCC) 2009   L calf   Headache    sinus   History of kidney stones    Pneumonia age 46 or 30   Prediabetes 02/21/2018   On metformin   Primary osteoarthritis of right hip 12/15/2016   PTSD (post-traumatic stress disorder)    Puncture wound    2010 left lower leg    Seasonal allergies    Past Surgical History:  Procedure Laterality Date   APPENDECTOMY     BOWEL RESECTION  02/04/2018   Procedure: SMALL BOWEL RESECTION;  Surgeon: Berna Bue, MD;  Location: [facility];  Service: General;;   HERNIA REPAIR     INGUINAL HERNIA REPAIR Right 02/04/2018   Procedure: HERNIA REPAIR INGUINAL ADULT;  Surgeon: Berna Bue, MD;  Location: [facility];  Service: General;  Laterality: Right;   LAPAROTOMY  02/04/2018   Procedure: EXPLORATORY LAPAROTOMY;  Surgeon: Berna Bue, MD;  Location: [facility];  Service: General;;   NASAL FRACTURE SURGERY     TOTAL HIP ARTHROPLASTY Left 02/01/2017   Procedure: LEFT TOTAL HIP ARTHROPLASTY ANTERIOR APPROACH;  Surgeon: Durene Romans, MD;  Location: [facility];  Service: Orthopedics;  Laterality: Left;   TOTAL HIP ARTHROPLASTY Right 05/24/2017   Procedure: RIGHT TOTAL HIP ARTHROPLASTY ANTERIOR APPROACH;  Surgeon: Durene Romans, MD;  Location: [facility];  Service: Orthopedics;  Laterality: Right;   Patient Active Problem List   Diagnosis Date Noted   Cervical radiculopathy due to degenerative joint disease of spine 09/29/2020    Carpal tunnel syndrome on right 09/29/2020   Ulnar neuropathy of right upper extremity 09/29/2020   Combined hyperlipidemia associated with type 2 diabetes mellitus (HCC) 02/27/2020   Controlled type 2 diabetes mellitus without complication, without long-term current use of insulin (HCC) 02/26/2019   GAD (generalized anxiety disorder) 10/11/2017   Overweight (BMI 25.0-29.9) 05/25/2017   S/P right THA, AA 05/24/2017   S/P left THA, AA 02/01/2017   Allergic asthma 12/15/2016   Cigarette nicotine dependence without complication 12/15/2016   Major depression, chronic 12/15/2016   Male-to-male transgender person 12/15/2016   PTSD (post-traumatic stress disorder) 12/15/2016    ONSET DATE: chronic   REFERRING DIAG: M25.531,M25.532 (ICD-10-CM) - Bilateral wrist pain   THERAPY DIAG:  No diagnosis found.  Rationale for Evaluation and Treatment: Rehabilitation  SUBJECTIVE:   SUBJECTIVE STATEMENT: She states ***.   Pt accompanied by: {accompnied:27141}  PERTINENT HISTORY: Per referral: ""Bilateral carpal tunnel syndrome  R > L"   PRECAUTIONS: {Therapy precautions:24002}  RED FLAGS: {PT Red Flags:29287}   WEIGHT BEARING RESTRICTIONS: {Yes ***/No:24003}  PAIN:  Are you having pain? {OPRCPAIN:27236}  FALLS: Has patient fallen in last 6 months? {fallsyesno:27318}  LIVING ENVIRONMENT: Lives with: {OPRC lives with:25569::"lives with their family"} Lives in: {Lives in:25570} Stairs: {opstairs:27293} Has following equipment at home: {Assistive devices:23999}  PLOF: {PLOF:24004}  PATIENT GOALS: ***  NEXT MD VISIT: ***    OBJECTIVE: (All objective assessments below are  from initial evaluation on: 12/29/23 unless otherwise specified.)    HAND DOMINANCE: Right ***  ADLs: Overall ADLs: States decreased ability to grab, hold household objects, pain and difficulty to open containers, perform FMS tasks (manipulate fasteners on clothing), mild to moderate bathing problems as  well. ***   FUNCTIONAL OUTCOME MEASURES: Eval: Quick DASH ***% impairment today  (Higher % Score  =  More Impairment)    Patient Specific Functional Scale: *** (***, ***, ***)  (Higher Score  =  Better Ability for the Selected Tasks)     Patient Rated Wrist Evaluation (PRWE): Pain: ***/50; Function: ***/50; Total Score: ***/100 (Higher Score  =  More Pain and/or Debility)    UPPER EXTREMITY ROM     Shoulder to Wrist AROM Right eval Left eval  Shoulder flexion    Shoulder abduction    Shoulder extension    Shoulder internal rotation    Shoulder external rotation    Elbow flexion    Elbow extension    Forearm supination    Forearm pronation     Wrist flexion    Wrist extension    Wrist ulnar deviation    Wrist radial deviation    Functional dart thrower's motion (F-DTM) in ulnar flexion    F-DTM in radial extension     (Blank rows = not tested)   Hand AROM Right eval Left eval  Full Fist Ability (or Gap to Distal Palmar Crease)    Thumb Opposition  (Kapandji Scale)     Thumb MCP (0-60)    Thumb IP (0-80)    Thumb Radial Abduction Span     Thumb Palmar Abduction Span     Index MCP (0-90)     Index PIP (0-100)     Index DIP (0-70)      Long MCP (0-90)      Long PIP (0-100)      Long DIP (0-70)      Ring MCP (0-90)      Ring PIP (0-100)      Ring DIP (0-70)      Little MCP (0-90)      Little PIP (0-100)      Little DIP (0-70)      (Blank rows = not tested)   UPPER EXTREMITY MMT:    Eval: *** NT at eval due to recent and still healing injuries. Will be tested when appropriate.   MMT Right TBD Left TBD  Shoulder flexion    Shoulder abduction    Shoulder adduction    Shoulder extension    Shoulder internal rotation    Shoulder external rotation    Middle trapezius    Lower trapezius    Elbow flexion    Elbow extension    Forearm supination    Forearm pronation    Wrist flexion    Wrist extension    Wrist ulnar deviation    Wrist radial deviation     (Blank rows = not tested)  HAND FUNCTION: Eval: Observed weakness in affected *** hand.  Grip strength Right: *** lbs, Left: *** lbs   COORDINATION: Eval: Observed coordination impairments with affected *** hand. Box and Blocks Test: *** Blocks today (*** is [facility]); 9 Hole Peg Test Right: ***sec, Left: *** sec (*** sec is WFL)   SENSATION: Eval: *** Light touch intact today, though diminished around sx area    EDEMA:   Eval: *** Mildly swollen in *** hand and wrist today, ***cm circumferentially around ***  COGNITION: Eval:  Overall cognitive status: [facility] for evaluation today ***  OBSERVATIONS:   Eval: ***   TODAY'S TREATMENT:  Post-evaluation treatment: ***   Modalities: {OPRCMODALITIES:31717}  PATIENT EDUCATION: Education details: See tx section above for details  Person educated: Patient Education method: Engineer, structural, Teach back, Handouts  Education comprehension: States and demonstrates understanding, Additional Education required    HOME EXERCISE PROGRAM: See tx section above for details    GOALS: Goals reviewed with patient? Yes   SHORT TERM GOALS: (STG required if POC>30 days) Target Date: ***  Pt will obtain protective, custom orthotic. Goal status: TBD/PRN,  MET ***  2.  Pt will demo/state understanding of initial HEP to improve pain levels and prerequisite motion. Goal status: INITIAL   LONG TERM GOALS: Target Date: ***  Pt will improve functional ability by decreased impairment per Quick DASH / PSFS / PRWE assessment from *** to *** or better, for better quality of life. Goal status: INITIAL  2.  Pt will improve grip strength in *** hand from ***lbs to at least ***lbs for functional use at home and in IADLs. Goal status: INITIAL  3.  Pt will improve A/ROM in *** from *** to at least ***, to have functional motion for tasks like reach and grasp.  Goal status: INITIAL  4.  Pt will improve strength in *** from *** MMT to at least ***  MMT to have increased functional ability to carry out selfcare and higher-level homecare tasks with less difficulty. Goal status: INITIAL  5.  Pt will improve coordination skills in ***, as seen by within functional limit score on *** testing to have increased functional ability to carry out fine motor tasks (fasteners, etc.) and more complex, coordinated IADLs (meal prep, sports, etc.).  Goal status: INITIAL  6.  Pt will decrease pain at worst from ***/10 to ***/10 or better to have better sleep and occupational participation in daily roles. Goal status: INITIAL   ASSESSMENT:  CLINICAL IMPRESSION: Patient is a 63 y.o. male who was seen today for occupational therapy evaluation for ***.   PERFORMANCE DEFICITS: in functional skills including {OT physical skills:25468}, cognitive skills including {OT cognitive skills:25469}, and psychosocial skills including {OT psychosocial skills:25470}.   IMPAIRMENTS: are limiting patient from {OT performance deficits:25471}.   COMORBIDITIES: {Comorbidities:25485} that affects occupational performance. Patient will benefit from skilled OT to address above impairments and improve overall function.  MODIFICATION OR ASSISTANCE TO COMPLETE EVALUATION: {OT modification:25474}  OT OCCUPATIONAL PROFILE AND HISTORY: {OT PROFILE AND HISTORY:25484}  CLINICAL DECISION MAKING: {OT CDM:25475}  REHAB POTENTIAL: {rehabpotential:25112}  EVALUATION COMPLEXITY: {Evaluation complexity:25115}      PLAN:  OT FREQUENCY: {rehab frequency:25116}  OT DURATION: {rehab duration:25117}  PLANNED INTERVENTIONS: {OT Interventions:25467}  RECOMMENDED OTHER SERVICES: ***  CONSULTED AND AGREED WITH PLAN OF CARE: {ZOX:09604}  PLAN FOR NEXT SESSION: ***   Fannie Knee, OTR/L, CHT 12/28/2023, 12:03 PM

## 2023-12-29 ENCOUNTER — Encounter: Payer: Medicare (Managed Care) | Admitting: Rehabilitative and Restorative Service Providers"

## 2024-01-17 ENCOUNTER — Ambulatory Visit: Payer: Medicare (Managed Care) | Admitting: Orthopedic Surgery

## 2024-01-30 ENCOUNTER — Encounter: Payer: Self-pay | Admitting: Emergency Medicine

## 2024-02-13 ENCOUNTER — Ambulatory Visit: Payer: Medicare (Managed Care) | Admitting: Family Medicine

## 2024-03-05 ENCOUNTER — Encounter: Payer: Self-pay | Admitting: Family Medicine

## 2024-03-05 ENCOUNTER — Ambulatory Visit (INDEPENDENT_AMBULATORY_CARE_PROVIDER_SITE_OTHER): Payer: Medicare (Managed Care) | Admitting: Family Medicine

## 2024-03-05 VITALS — BP 137/75 | HR 76 | Temp 98.7°F | Ht 69.0 in | Wt 172.6 lb

## 2024-03-05 DIAGNOSIS — M1811 Unilateral primary osteoarthritis of first carpometacarpal joint, right hand: Secondary | ICD-10-CM | POA: Insufficient documentation

## 2024-03-05 DIAGNOSIS — Z7984 Long term (current) use of oral hypoglycemic drugs: Secondary | ICD-10-CM

## 2024-03-05 DIAGNOSIS — M1812 Unilateral primary osteoarthritis of first carpometacarpal joint, left hand: Secondary | ICD-10-CM | POA: Insufficient documentation

## 2024-03-05 DIAGNOSIS — Z23 Encounter for immunization: Secondary | ICD-10-CM | POA: Diagnosis not present

## 2024-03-05 DIAGNOSIS — F4310 Post-traumatic stress disorder, unspecified: Secondary | ICD-10-CM

## 2024-03-05 DIAGNOSIS — F411 Generalized anxiety disorder: Secondary | ICD-10-CM | POA: Diagnosis not present

## 2024-03-05 DIAGNOSIS — F329 Major depressive disorder, single episode, unspecified: Secondary | ICD-10-CM

## 2024-03-05 DIAGNOSIS — F17210 Nicotine dependence, cigarettes, uncomplicated: Secondary | ICD-10-CM

## 2024-03-05 DIAGNOSIS — Z122 Encounter for screening for malignant neoplasm of respiratory organs: Secondary | ICD-10-CM

## 2024-03-05 DIAGNOSIS — E119 Type 2 diabetes mellitus without complications: Secondary | ICD-10-CM

## 2024-03-05 LAB — MICROALBUMIN / CREATININE URINE RATIO
Creatinine,U: 73.7 mg/dL
Microalb Creat Ratio: 37 mg/g — ABNORMAL HIGH (ref 0.0–30.0)
Microalb, Ur: 2.7 mg/dL — ABNORMAL HIGH (ref 0.0–1.9)

## 2024-03-05 LAB — POCT GLYCOSYLATED HEMOGLOBIN (HGB A1C): Hemoglobin A1C: 6.2 % — AB (ref 4.0–5.6)
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
## 2024-03-05 NOTE — Patient Instructions (Signed)
 Please return in September for your annual complete physical; please come fasting.   If you have any questions or concerns, please don't hesitate to send me a message via MyChart or call the office at [phone]. Thank you for visiting with us  today! It's our pleasure caring for you.    VISIT SUMMARY: You had a routine follow-up appointment to review your overall health and manage your ongoing conditions. Your diabetes management is progressing well, and you have made positive dietary changes. We discussed your current medications, including bupropion , and addressed your concerns about long-term use. You also mentioned increased inhaler use due to environmental triggers and ongoing issues with arthritis in your thumb joint. We reviewed your general health maintenance and planned necessary screenings and tests.  YOUR PLAN: -TYPE 2 DIABETES MELLITUS: Type 2 diabetes is a condition where your body does not use insulin  properly, leading to high blood sugar levels. Your diabetes is well-controlled with an A1c of 6.2, and you are continuing with metformin  and dietary adjustments.  -ASTHMA: Asthma is a condition that affects your airways, making it hard to breathe. You need to use your inhalers more frequently due to poor air quality at your mother's house. Make sure your inhaler prescriptions are up to date.  -DEPRESSION: Depression is a mental health condition that affects your mood and daily activities. Your bupropion  dosage has been increased to 300 mg, and you have reported feeling better with this dosage.  -ARTHRITIS OF THUMB JOINT: Arthritis is a condition that causes pain and stiffness in the joints. Your thumb joint arthritis has shown 50% improvement with exercises and therapy from your chiropractor and sports medicine specialist.  -screening: During your visit, we discussed general health maintenance, including the importance of completing your Cologuard test for colorectal cancer screening,  scheduling a PSA test, and rescheduling your lung cancer screening. We also plan to perform a urine test to check for microalbuminuria and schedule a physical exam in September.  INSTRUCTIONS: Please complete your Cologuard test as soon as possible. Schedule your PSA test and reschedule your lung cancer screening. Ensure your inhaler prescriptions are up to date. We will perform a urine test to check for microalbuminuria and schedule a physical exam in September.                      Contains text generated by Abridge.                                 Contains text generated by Abridge.

## 2024-03-14 ENCOUNTER — Other Ambulatory Visit: Payer: Self-pay | Admitting: Family Medicine

## 2024-03-15 NOTE — Progress Notes (Signed)
 Please call patient: lab results show some early kidney damage with small amounts of protein leakage in the urine. Need to start a preventive medication: please order lisinopril 5mg  daily #90 3 rf.

## 2024-03-16 ENCOUNTER — Other Ambulatory Visit: Payer: Self-pay

## 2024-03-16 MED ORDER — ALBUTEROL SULFATE HFA 108 (90 BASE) MCG/ACT IN AERS: 2.0000 | INHALATION_SPRAY | RESPIRATORY_TRACT | 3 refills | Status: DC | PRN

## 2024-03-16 MED ORDER — LISINOPRIL 5 MG PO TABS: 5.0000 mg | ORAL_TABLET | Freq: Every day | ORAL | 3 refills | Status: AC

## 2024-05-09 ENCOUNTER — Other Ambulatory Visit: Payer: Self-pay | Admitting: Family Medicine

## 2024-05-09 NOTE — Telephone Encounter (Unsigned)
 Copied from CRM [id number]. Topic: Clinical - Medication Refill >> May 09, 2024  3:16 PM Robinson H wrote: Medication: metFORMIN  (GLUCOPHAGE ) 1000 MG tablet  Has the patient contacted their pharmacy? Yes, couldn't get to pharmacy (Agent: If no, request that the patient contact the pharmacy for the refill. If patient does not wish to contact the pharmacy document the reason why and proceed with request.) (Agent: If yes, when and what did the pharmacy advise?)  This is the patient's preferred pharmacy:    [facility] [place], [place] - [address] [place] [address] Phone: [phone] Fax: [phone]  Is this the correct pharmacy for this prescription? Yes If no, delete pharmacy and type the correct one.   Has the prescription been filled recently? No  Is the patient out of the medication? Yes  Has the patient been seen for an appointment in the last year OR does the patient have an upcoming appointment? Yes  Can we respond through MyChart? No  Agent: Please be advised that Rx refills may take up to 3 business days. We ask that you follow-up with your pharmacy.

## 2024-05-10 MED ORDER — METFORMIN HCL 1000 MG PO TABS: 1000.0000 mg | ORAL_TABLET | Freq: Two times a day (BID) | ORAL | 3 refills | Status: AC

## 2024-07-31 ENCOUNTER — Ambulatory Visit: Payer: Self-pay

## 2024-07-31 ENCOUNTER — Other Ambulatory Visit: Payer: Self-pay | Admitting: Family Medicine

## 2024-07-31 MED ORDER — HYDROXYZINE PAMOATE 25 MG PO CAPS: 25.0000 mg | ORAL_CAPSULE | Freq: Every day | ORAL | 3 refills | Status: AC

## 2024-07-31 MED ORDER — ALBUTEROL SULFATE HFA 108 (90 BASE) MCG/ACT IN AERS: 2.0000 | INHALATION_SPRAY | RESPIRATORY_TRACT | 3 refills | Status: DC | PRN

## 2024-07-31 NOTE — Telephone Encounter (Signed)
  FYI Only or Action Required?: FYI only for provider.  Patient was last seen in primary care on 03/05/2024 by Jodie Lavern CROME, MD.  Called Nurse Triage reporting Shortness of Breath.  Symptoms began yesterday.  Interventions attempted: Nothing.  Symptoms are: unchanged.  Triage Disposition: No disposition on file.  Patient/caregiver understands and will follow disposition?:   Is staying with someone who has cats and she is allergic.  Refills sent separately.   Copied from CRM [id number]. Topic: Clinical - Red Word Triage >> Jul 31, 2024  2:57 PM Franky GRADE wrote: Red Word that prompted transfer to Nurse Triage: Patient is experiencing difficulty breathing that started today. Reason for Disposition  [1] MILD longstanding difficulty breathing (e.g., minimal/no SOB at rest, SOB with walking, pulse < 100) AND [2] SAME as normal  Answer Assessment - Initial Assessment Questions 1. RESPIRATORY STATUS: Describe your breathing? (e.g., wheezing, shortness of breath, unable to speak, severe coughing)      Shortness of breath 2. ONSET: When did this breathing problem begin?      Last night 3. PATTERN Does the difficult breathing come and go, or has it been constant since it started?      Comes and goes 4. SEVERITY: How bad is your breathing? (e.g., mild, moderate, severe)      mild 5. RECURRENT SYMPTOM: Have you had difficulty breathing before? If Yes, ask: When was the last time? and What happened that time?      yes 6. CARDIAC HISTORY: Do you have any history of heart disease? (e.g., heart attack, angina, bypass surgery, angioplasty)      denies 7. LUNG HISTORY: Do you have any history of lung disease?  (e.g., pulmonary embolus, asthma, emphysema)     asthma 8. CAUSE: What do you think is causing the breathing problem?      Asthma and has been around cats 9. OTHER SYMPTOMS: Do you have any other symptoms? (e.g., chest pain, cough, dizziness, fever, runny nose)      wheezing 10. O2 SATURATION MONITOR:  Do you use an oxygen saturation monitor (pulse oximeter) at home? If Yes, ask: What is your reading (oxygen level) today? What is your usual oxygen saturation reading? (e.g., 95%)       na 11. PREGNANCY: Is there any chance you are pregnant? When was your last menstrual period?       na 12. TRAVEL: Have you traveled out of the country in the last month? (e.g., travel history, exposures)       no  Protocols used: Breathing Difficulty-A-AH

## 2024-07-31 NOTE — Telephone Encounter (Signed)
 FYI Only or Action Required?: Action required by provider: medication refill request.  Patient was last seen in primary care on 03/05/2024 by Jodie Lavern CROME, MD.  Called Nurse Triage reporting No chief complaint on file..  Symptoms began today.  Interventions attempted: Nothing.  Symptoms are: unchanged.  Triage Disposition: No disposition on file.  Patient/caregiver understands and will follow disposition?:

## 2024-08-01 NOTE — Telephone Encounter (Signed)
 Noted

## 2024-08-02 ENCOUNTER — Encounter: Payer: Medicare (Managed Care) | Admitting: Family Medicine

## 2024-08-08 ENCOUNTER — Ambulatory Visit: Payer: Medicare (Managed Care) | Admitting: Family Medicine

## 2024-08-08 VITALS — BP 124/60 | HR 58 | Temp 97.7°F | Ht 69.0 in | Wt 174.0 lb

## 2024-08-08 DIAGNOSIS — Z23 Encounter for immunization: Secondary | ICD-10-CM

## 2024-08-08 DIAGNOSIS — E119 Type 2 diabetes mellitus without complications: Secondary | ICD-10-CM

## 2024-08-08 DIAGNOSIS — E782 Mixed hyperlipidemia: Secondary | ICD-10-CM | POA: Diagnosis not present

## 2024-08-08 DIAGNOSIS — F329 Major depressive disorder, single episode, unspecified: Secondary | ICD-10-CM | POA: Diagnosis not present

## 2024-08-08 DIAGNOSIS — Z0001 Encounter for general adult medical examination with abnormal findings: Secondary | ICD-10-CM

## 2024-08-08 DIAGNOSIS — F17210 Nicotine dependence, cigarettes, uncomplicated: Secondary | ICD-10-CM

## 2024-08-08 DIAGNOSIS — Z0000 Encounter for general adult medical examination without abnormal findings: Secondary | ICD-10-CM | POA: Diagnosis not present

## 2024-08-08 DIAGNOSIS — F411 Generalized anxiety disorder: Secondary | ICD-10-CM | POA: Diagnosis not present

## 2024-08-08 DIAGNOSIS — E1169 Type 2 diabetes mellitus with other specified complication: Secondary | ICD-10-CM

## 2024-08-08 DIAGNOSIS — Z789 Other specified health status: Secondary | ICD-10-CM

## 2024-08-08 DIAGNOSIS — F4310 Post-traumatic stress disorder, unspecified: Secondary | ICD-10-CM

## 2024-08-08 DIAGNOSIS — Z7984 Long term (current) use of oral hypoglycemic drugs: Secondary | ICD-10-CM

## 2024-08-08 DIAGNOSIS — M4722 Other spondylosis with radiculopathy, cervical region: Secondary | ICD-10-CM

## 2024-08-09 LAB — CBC WITH DIFFERENTIAL/PLATELET
Basophils Absolute: 0 K/uL (ref 0.0–0.1)
Basophils Relative: 0.8 % (ref 0.0–3.0)
Eosinophils Absolute: 0.5 K/uL (ref 0.0–0.7)
Eosinophils Relative: 9.6 % — ABNORMAL HIGH (ref 0.0–5.0)
HCT: 47 % (ref 39.0–52.0)
Hemoglobin: 15.6 g/dL (ref 13.0–17.0)
Lymphocytes Relative: 26.4 % (ref 12.0–46.0)
Lymphs Abs: 1.5 K/uL (ref 0.7–4.0)
MCHC: 33.3 g/dL (ref 30.0–36.0)
MCV: 93.1 fl (ref 78.0–100.0)
Monocytes Absolute: 0.4 K/uL (ref 0.1–1.0)
Monocytes Relative: 7.5 % (ref 3.0–12.0)
Neutro Abs: 3.1 K/uL (ref 1.4–7.7)
Neutrophils Relative %: 55.7 % (ref 43.0–77.0)
Platelets: 262 K/uL (ref 150.0–400.0)
RBC: 5.05 Mil/uL (ref 4.22–5.81)
RDW: 14 % (ref 11.5–15.5)
WBC: 5.6 K/uL (ref 4.0–10.5)

## 2024-08-09 LAB — COMPREHENSIVE METABOLIC PANEL WITH GFR
ALT: 24 U/L (ref 0–53)
AST: 18 U/L (ref 0–37)
Albumin: 5 g/dL (ref 3.5–5.2)
Alkaline Phosphatase: 50 U/L (ref 39–117)
BUN: 15 mg/dL (ref 6–23)
CO2: 28 meq/L (ref 19–32)
Calcium: 10.3 mg/dL (ref 8.4–10.5)
Chloride: 103 meq/L (ref 96–112)
Creatinine, Ser: 0.62 mg/dL (ref 0.40–1.50)
GFR: 102.54 mL/min (ref >60.00)
Glucose, Bld: 132 mg/dL — ABNORMAL HIGH (ref 70–99)
Potassium: 4.8 meq/L (ref 3.5–5.1)
Sodium: 144 meq/L (ref 135–145)
Total Bilirubin: 0.5 mg/dL (ref 0.2–1.2)
Total Protein: 7.5 g/dL (ref 6.0–8.3)

## 2024-08-09 LAB — LIPID PANEL
Cholesterol: 170 mg/dL (ref 0–200)
HDL: 57.4 mg/dL (ref >39.00)
LDL Cholesterol: 85 mg/dL (ref 0–99)
NonHDL: 112.12
Total CHOL/HDL Ratio: 3
Triglycerides: 138 mg/dL (ref 0.0–149.0)
VLDL: 27.6 mg/dL (ref 0.0–40.0)

## 2024-08-09 LAB — MICROALBUMIN / CREATININE URINE RATIO
Creatinine,U: 44.5 mg/dL
Microalb Creat Ratio: 18.6 mg/g (ref 0.0–30.0)
Microalb, Ur: 0.8 mg/dL (ref 0.0–1.9)

## 2024-08-09 LAB — TSH: TSH: 0.92 u[IU]/mL (ref 0.35–5.50)

## 2024-08-09 LAB — HEMOGLOBIN A1C: Hgb A1c MFr Bld: 7.3 % — ABNORMAL HIGH (ref 4.6–6.5)

## 2024-08-13 ENCOUNTER — Other Ambulatory Visit: Payer: Self-pay

## 2024-08-13 ENCOUNTER — Ambulatory Visit: Payer: Self-pay | Admitting: Family Medicine

## 2024-08-13 DIAGNOSIS — E119 Type 2 diabetes mellitus without complications: Secondary | ICD-10-CM

## 2024-08-13 DIAGNOSIS — E1169 Type 2 diabetes mellitus with other specified complication: Secondary | ICD-10-CM

## 2024-08-13 MED ORDER — GLIPIZIDE ER 5 MG PO TB24: 5.0000 mg | ORAL_TABLET | Freq: Every day | ORAL | 3 refills | Status: AC

## 2024-08-13 MED ORDER — ATORVASTATIN CALCIUM 10 MG PO TABS: 10.0000 mg | ORAL_TABLET | Freq: Every day | ORAL | 3 refills | Status: AC

## 2024-08-13 NOTE — Progress Notes (Signed)
 Please call patient: labs show diabetic control has worsened with A1c at 7.3. Please start glucotrol xl 5g daily in addition to the metformin  twice a day that she is already taking. Also, I'd like to change from simvastatin  to lipitor 10mg  to get cholesterol levels lower. Please order both medication and notify patient of changes.

## 2024-08-13 NOTE — Progress Notes (Signed)
 Subjective  CC:  Chief Complaint  Patient presents with   Annual Exam    Pt here for Annual Exam and is currently fasting   Diabetes    HPI: Corey Collins is a 63 y.o. adult who presents to the office today to address the problems listed above in the chief complaint. Discussed the use of AI scribe software for clinical note transcription with the patient, who gave verbal consent to proceed.  History of Present Illness Corey Collins is a 63 year old male who presents for annual exam and chronic problem f/u  Gender dysphoria and gender-affirming care - Seeking breast augmentation as part of gender transition to improve self-image and actualization - Completed more than two years of hormone therapy - Considering surgery as the next step in gender transition  Metabolic and cardiovascular risk factors - Diabetes mellitus with recent nonadherence to metformin  for the last few days - Hyperlipidemia with nonadherence to simvastatin  for approximately 1.5 months - Dietary modifications include reducing meat and fried food intake and attempting to avoid sugar  Tobacco use - Currently smokes five to six cigarettes per day - Plans to quit smoking entirely prior to surgery  Psychosocial stressors and emotional health - Significant grief and emotional distress over the past year due to deaths of brother-in-law, mother-in-law, and a close friend - Supporting former wife through shared losses - Grieving the loss of her 38 year old dog in May, who was a major source of emotional support  Physical activity and functional status - Participates in volleyball, which was not possible two years ago due to limited arm mobility - Considering participation in [place]  Vocational rehabilitation and occupational status - Engaged in vocational rehabilitation to assess work capacity post-transition - Furniture conservator/restorer for jobs previously held, including remote work and Radiographer, therapeutic  for [employer]   Assessment  1. Encounter for well adult exam with abnormal findings   2. Need for influenza vaccination   3. Controlled type 2 diabetes mellitus without complication, without long-term current use of insulin  (HCC)   4. Diabetes mellitus treated with oral medication (HCC)   5. Combined hyperlipidemia associated with type 2 diabetes mellitus (HCC)   6. GAD (generalized anxiety disorder)   7. Major depression, chronic   8. Male-to-male transgender person   49. PTSD (post-traumatic stress disorder)   10. Cigarette nicotine dependence without complication   11. Cervical radiculopathy due to degenerative joint disease of spine      Plan  Assessment and Plan Assessment & Plan Adult Wellness Visit Well-controlled blood pressure and oxygen saturation. Stress due to personal losses. Discussed breast augmentation as necessary for well-being. - Documented discussion on breast augmentation necessity. - Check cholesterol levels. - Perform blood and urine tests.  Type 2 diabetes mellitus Recent lapse in metformin  use. Concern about sugar intake impact on blood sugar. - Check blood sugar levels. - Resume metformin  as prescribed.  Generalized anxiety disorder, depression, and post-traumatic stress disorder Prazosin  and Wellbutrin  effective in managing symptoms. Stress from personal losses noted. - Continue prazosin  and Wellbutrin .  Nicotine dependence, cigarettes Reduced smoking to 5-6 cigarettes daily. Plans to quit before surgery.  General Health Maintenance Discussed colon and lung cancer screening. Cologuard test not completed. Lung cancer screening previously canceled. - Verify availability of Cologuard test. - Investigate lung cancer screening cancellation.    Follow up: 6 mo Orders Placed This Encounter  Procedures   Flu vaccine trivalent PF, 6mos and older(Flulaval,Afluria,Fluarix,Fluzone)   CBC with Differential/Platelet  Comprehensive metabolic  panel with GFR   Lipid panel   Hemoglobin A1c   TSH   Microalbumin / creatinine urine ratio   No orders of the defined types were placed in this encounter.    I reviewed the patients updated PMH, FH, and SocHx.  Patient Active Problem List   Diagnosis Date Noted   Cervical radiculopathy due to degenerative joint disease of spine 09/29/2020    Priority: High   Carpal tunnel syndrome on right 09/29/2020    Priority: High   Combined hyperlipidemia associated with type 2 diabetes mellitus (HCC) 02/27/2020    Priority: High   Controlled type 2 diabetes mellitus without complication, without long-term current use of insulin  (HCC) 02/26/2019    Priority: High   GAD (generalized anxiety disorder) 10/11/2017    Priority: High   Allergic asthma 12/15/2016    Priority: High   Cigarette nicotine dependence without complication 12/15/2016    Priority: High   Major depression, chronic 12/15/2016    Priority: High   Male-to-male transgender person 12/15/2016    Priority: High   PTSD (post-traumatic stress disorder) 12/15/2016    Priority: High   Ulnar neuropathy of right upper extremity 09/29/2020    Priority: Medium    S/P right THA, 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   ALPRAZolam  (XANAX ) 0.5 MG tablet Take 1 tablet (0.5 mg total) by mouth at bedtime as needed for anxiety.   buPROPion  (WELLBUTRIN  XL) 300 MG 24 hr tablet Take 1 tablet (300 mg total) by mouth daily.   Cholecalciferol (VITAMIN D3 GUMMIES ADULT PO) Take 2 each by mouth daily.   Coenzyme  Q10 (COQ-10) 50 MG CAPS Take 50 mg by mouth daily.    hydrOXYzine  (VISTARIL ) 25 MG capsule Take 1 capsule (25 mg total) by mouth at bedtime.   ketoconazole  (NIZORAL ) 2 % shampoo Apply 1 Application topically 2 (two) times a week.   lisinopril  (ZESTRIL ) 5 MG tablet Take 1 tablet (5 mg total) by mouth daily.   metFORMIN  (GLUCOPHAGE ) 1000 MG tablet Take 1 tablet (1,000 mg total) by mouth 2 (two) times daily with a meal.   Omega-3 Fatty Acids (FISH OIL) 1000 MG CAPS Take 1,000 mg by mouth daily.    polyethylene glycol powder (GLYCOLAX /MIRALAX ) 17 GM/SCOOP powder Take 17 g by mouth 2 (two) times daily as needed.   prazosin  (MINIPRESS ) 1 MG capsule Take 1 capsule (1 mg total) by mouth at bedtime.   Prenatal Vit-Min-FA-Fish Oil ([employer] PRENATAL GUMMY PO) Take 2 tablets by mouth daily.   simvastatin  (ZOCOR ) 20 MG tablet Take 1 tablet (20 mg total) by mouth at bedtime.   triamcinolone  cream (KENALOG ) 0.1 % APPLY 1 APPLICATION TWO TIMES A DAY FOR 2 WEEKS THEN AS NEEDED   Allergies: Patient is allergic to sulfa antibiotics. Family History: Patient family history includes Coronary artery disease in her father; Diabetes in an other family member; Hypertension in an other family member. Social History:  Patient  reports that she has been smoking cigarettes. She started smoking about 20  years ago. She has a 9.8 pack-year smoking history. She has never used smokeless tobacco. She reports current drug use. Drug: Marijuana. She reports that she does not drink alcohol.  Review of Systems: Constitutional: Negative for fever malaise or anorexia Cardiovascular: negative for chest pain Respiratory: negative for SOB or persistent cough Gastrointestinal: negative for abdominal pain  Objective  Vitals: BP 124/60   Pulse (!) 58   Temp 97.7 F (36.5 C)   Ht 5' 9 (1.753 m)   Wt 174 lb (78.9 kg)   SpO2 97%   BMI 25.70 kg/m  General: no acute distress , A&Ox3 HEENT: PEERL, conjunctiva normal, neck is  supple Cardiovascular:  RRR without murmur or gallop.  Respiratory:  Good breath sounds bilaterally, CTAB with normal respiratory effort Skin:  Warm, no rashes Commons side effects, risks, benefits, and alternatives for medications and treatment plan prescribed today were discussed, and the patient expressed understanding of the given instructions. Patient is instructed to call or message via MyChart if he/she has any questions or concerns regarding our treatment plan. No barriers to understanding were identified. We discussed Red Flag symptoms and signs in detail. Patient expressed understanding regarding what to do in case of urgent or emergency type symptoms.  Medication list was reconciled, printed and provided to the patient in AVS. Patient instructions and summary information was reviewed with the patient as documented in the AVS. This note was prepared with assistance of Dragon voice recognition software. Occasional wrong-word or sound-a-like substitutions may have occurred due to the inherent limitations of voice recognition software

## 2024-08-13 NOTE — Progress Notes (Signed)
 I have tried to reach out to pt but one # says not in service and the other says can not be completed as dialed. I have tried 4x to repeat those # and it keeps saying the same thing. All Rx has been sent to pharmacy

## 2024-08-14 ENCOUNTER — Telehealth: Payer: Self-pay

## 2024-08-14 ENCOUNTER — Other Ambulatory Visit: Payer: Self-pay | Admitting: Family Medicine

## 2024-08-14 NOTE — Telephone Encounter (Signed)
 Copied from CRM [id number]. Topic: Clinical - Medication Refill >> Aug 14, 2024 12:59 PM Robinson H wrote: Medication: albuterol  (VENTOLIN  HFA) 108 (90 Base) MCG/ACT inhaler, triamcinolone  cream (KENALOG ) 0.1 %  Has the patient contacted their pharmacy? Yes, system said call provider (Agent: If no, request that the patient contact the pharmacy for the refill. If patient does not wish to contact the pharmacy document the reason why and proceed with request.) (Agent: If yes, when and what did the pharmacy advise?)  This is the patient's preferred pharmacy:  [id number] [place], [place] - [address] [place] [place] [address] Phone: [phone] Fax: [phone]   Is this the correct pharmacy for this prescription? Yes If no, delete pharmacy and type the correct one.   Has the prescription been filled recently? No  Is the patient out of the medication? Yes  Has the patient been seen for an appointment in the last year OR does the patient have an upcoming appointment? Yes  Can we respond through MyChart? No  Agent: Please be advised that Rx refills may take up to 3 business days. We ask that you follow-up with your pharmacy.

## 2024-08-14 NOTE — Telephone Encounter (Signed)
 Copied from CRM [id number]. Topic: Clinical - Lab/Test Results >> Aug 14, 2024  1:05 PM Robinson H wrote: Reason for CRM: Patient is calling to update clinic with his new phone number, states he's waiting on lab results  Bari [phone]  Spoke with pt regarding lab results/recommendation also gave Hav the new update phone # for pt.

## 2024-08-30 ENCOUNTER — Telehealth: Payer: Self-pay

## 2024-08-30 NOTE — Telephone Encounter (Unsigned)
 Copied from CRM [id number]. Topic: Clinical - Request for Lab/Test Order >> Aug 30, 2024  1:48 PM Thersia BROCKS wrote: Reason for CRM: Patient called in regarding PSA labs would like a callback to be scheduled once order has been it

## 2024-08-30 NOTE — Telephone Encounter (Signed)
 Copied from CRM #[id number]. Topic: General - Other >> Aug 30, 2024  1:45 PM Thersia BROCKS wrote: Reason for CRM: Patient called in stated needs cancer screening , need to be rereferred to the imaging place , patient stated would like to rereferred somewhere else  Would like a callback regarding this  [phone]  Message sent to PCP to address

## 2024-09-03 ENCOUNTER — Other Ambulatory Visit: Payer: Self-pay

## 2024-09-03 DIAGNOSIS — Z125 Encounter for screening for malignant neoplasm of prostate: Secondary | ICD-10-CM

## 2024-09-07 NOTE — Telephone Encounter (Signed)
 Unable to LVM due to VM not being set up.

## 2024-09-10 ENCOUNTER — Encounter: Payer: Self-pay | Admitting: Radiology

## 2024-09-10 ENCOUNTER — Other Ambulatory Visit: Payer: Medicare (Managed Care)

## 2024-09-10 DIAGNOSIS — Z125 Encounter for screening for malignant neoplasm of prostate: Secondary | ICD-10-CM | POA: Diagnosis not present

## 2024-09-12 LAB — PSA: PSA: 0.56 ng/mL (ref 0.10–4.00)

## 2024-09-13 ENCOUNTER — Encounter: Payer: Self-pay | Admitting: Emergency Medicine

## 2024-09-13 ENCOUNTER — Ambulatory Visit: Payer: Self-pay | Admitting: Family Medicine

## 2024-09-13 NOTE — Progress Notes (Signed)
 See mychart note Dear Ms. Lyall, Your PSA test is normal.  Sincerely, Dr. Jodie

## 2024-09-17 ENCOUNTER — Telehealth: Payer: Self-pay

## 2024-09-17 NOTE — Telephone Encounter (Signed)
 Noted

## 2024-09-19 ENCOUNTER — Other Ambulatory Visit: Payer: Self-pay

## 2024-09-19 ENCOUNTER — Telehealth: Payer: Self-pay | Admitting: Family Medicine

## 2024-09-19 MED ORDER — ALBUTEROL SULFATE HFA 108 (90 BASE) MCG/ACT IN AERS: 2.0000 | INHALATION_SPRAY | RESPIRATORY_TRACT | 3 refills | Status: DC | PRN

## 2024-09-19 NOTE — Telephone Encounter (Signed)
 Rx sent.

## 2024-09-19 NOTE — Telephone Encounter (Unsigned)
 Copied from CRM [id number]. Topic: Clinical - Medication Refill >> Sep 19, 2024  8:05 AM Harlene ORN wrote: Medication: albuterol  (VENTOLIN  HFA) 108 (90 Base) MCG/ACT inhaler  Has the patient contacted their pharmacy? Yes (Agent: If no, request that the patient contact the pharmacy for the refill. If patient does not wish to contact the pharmacy document the reason why and proceed with request.) (Agent: If yes, when and what did the pharmacy advise?)  This is the patient's preferred pharmacy:  [facility] [id number] [place], [place] - [address] [place] [address] Phone: [phone] Fax: [phone]  Is this the correct pharmacy for this prescription? Yes If no, delete pharmacy and type the correct one.   Has the prescription been filled recently? Yes  Is the patient out of the medication? Yes  Has the patient been seen for an appointment in the last year OR does the patient have an upcoming appointment? Yes  Can we respond through MyChart? Yes  Agent: Please be advised that Rx refills may take up to 3 business days. We ask that you follow-up with your pharmacy.

## 2024-10-17 ENCOUNTER — Ambulatory Visit: Payer: Self-pay

## 2024-10-17 NOTE — Telephone Encounter (Signed)
 Patient has appointment tomorrow with Lucius RAMAN NP

## 2024-10-17 NOTE — Telephone Encounter (Signed)
 FYI Only or Action Required?: FYI only for provider: appointment scheduled on 10/18/24.  Patient was last seen in primary care on 08/08/2024 by Jodie Lavern CROME, MD.  Called Nurse Triage reporting Hemoptysis.  Symptoms began several weeks ago.  Interventions attempted: OTC medications: Chloraseptic, cough drops, robitussin.  Symptoms are: unchanged.  Triage Disposition: See PCP When Office is Open (Within 3 Days)  Patient/caregiver understands and will follow disposition?: Yes Reason for Disposition  [1] More than one episode of coughing up blood AND [2] < 1 tablespoon (15 ml) of pure red blood  Answer Assessment - Initial Assessment Questions Patient reports quitting smoking during this time and felt worse, so started smoking 3-4 cigarettes a day. Used some cough drops, robitussin, chloraseptic   1. ONSET: When did the cough begin?      November 3, thought it was a bad cold but has not gone away and progressed  2. SEVERITY: How bad is the cough today? Did the blood appear after a coughing spell?       3. SPUTUM: Describe the color of your sputum (e.g., none, dry cough; clear, white, yellow, green)     Yellow to white and back to yellow, bright red blood today  4. HEMOPTYSIS: How much blood? (e.g., flecks, streaks, tablespoons)     Streak of bright red blood  5. DIFFICULTY BREATHING: Are you having difficulty breathing? If Yes, ask: How bad is it? (e.g., mild, moderate, severe)      Denies  6. FEVER: Do you have a fever? If Yes, ask: What is your temperature, how was it measured, and when did it start?     Denies  7. CARDIAC HISTORY: Do you have any history of heart disease? (e.g., heart attack, congestive heart failure)      Denies  8. LUNG HISTORY: Do you have any history of lung disease?  (e.g., pulmonary embolus, asthma, emphysema)     Unsure  9. OTHER SYMPTOMS: Do you have any other symptoms? (e.g., runny nose, wheezing, chest pain)        Denies, just nagging cough  Protocols used: Coughing Up Blood-A-AH  Copied from CRM #[id number]. Topic: Clinical - Red Word Triage >> Oct 17, 2024 11:34 AM Ashley R wrote: Red Word that prompted transfer to Nurse Triage: Coughing up phlegm and blood, difficulty breathing

## 2024-10-18 ENCOUNTER — Ambulatory Visit: Payer: Medicare (Managed Care) | Admitting: Family

## 2024-10-18 ENCOUNTER — Encounter: Payer: Self-pay | Admitting: Family

## 2024-10-18 VITALS — BP 110/68 | HR 58 | Temp 98.4°F | Ht 69.0 in | Wt 170.0 lb

## 2024-10-18 DIAGNOSIS — J069 Acute upper respiratory infection, unspecified: Secondary | ICD-10-CM

## 2024-10-18 MED ADMIN — Methylprednisolone Acetate Inj Susp 80 MG/ML: 80 mg | INTRAMUSCULAR | NDC 70121157401

## 2024-10-18 NOTE — Progress Notes (Signed)
 Patient ID: Corey Collins, adult    DOB: 1960-11-28, 63 y.o.   MRN: [id number]  Chief Complaint  Patient presents with   Cough    Pt c/o Cough with blood/yellow mucus and upper chest pain for 4 weeks.  Discussed the use of AI scribe software for clinical note transcription with the patient, who gave verbal consent to proceed.  History of Present Illness Corey Collins is a 63 year old male who presents with persistent cough and respiratory symptoms.  She has had respiratory symptoms for about a month, starting with 3 to 4 days of fever, followed by persistent cough with drainage. She briefly improved after a week, then developed chills and feeling feverish again without checking temperature. The cough and phlegm have persisted. She notes wheezing, especially on inhalation, which has improved somewhat with recent ibuprofen  use. She reports coughing up a small amount of blood, which prompted this visit. She smokes less than half a pack per day, having quit in 1990 and restarted in 2011, and has been trying to quit again. She has asthma with cat allergy and prior severe reactions. She lives with a cat. She uses an albuterol  inhaler and notes decreased use over the past 10 days compared with the prior 20 days. She has had pneumonia multiple times, including a severe episode at age 44. She received a conjugate pneumonia vaccine in April 2018 and Prevnar 20 in April of this year, she also receives annual flu vaccines. She has palpitations associated with caffeine and drinks Coke Zero. She has not had a recent chest x-ray and reports a canceled chest x-ray for cancer screening last year without a clear reason.  Assessment & Plan Acute upper respiratory infection Intermittent cough with phlegm, fever, chills, and occasional hemoptysis. Differential includes bronchitis or pneumonia. Lungs clear, no wheezing or distress. Recent ibuprofen  with relief suggests inflammation. - Ordered  chest x-ray to rule out bronchitis or pneumonia. - Administered steroid injection, DepoMedrol 80mg  to reduce inflammation and alleviate cough. - Advised albuterol  inhaler use in the morning and at bedtime until cough resolves. - Recommended generic OTC Mucinex (guaifenesin) once or twice daily to loosen mucus. - Encouraged increased water  intake, at least 2L daily.  History of tobacco use Resumed smoking in 2011, less than half a pack per day. Recent cessation increased coughing, possibly due to withdrawal or respiratory issues.   Subjective:    Outpatient Medications Prior to Visit  Medication Sig Dispense Refill   albuterol  (VENTOLIN  HFA) 108 (90 Base) MCG/ACT inhaler Inhale 2 puffs into the lungs every 4 (four) hours as needed for wheezing or shortness of breath. 8.5 g 3   ALPRAZolam  (XANAX ) 0.5 MG tablet Take 1 tablet (0.5 mg total) by mouth at bedtime as needed for anxiety. 20 tablet 0   atorvastatin  (LIPITOR) 10 MG tablet Take 1 tablet (10 mg total) by mouth daily. 90 tablet 3   buPROPion  (WELLBUTRIN  XL) 300 MG 24 hr tablet Take 1 tablet (300 mg total) by mouth daily. 90 tablet 3   Cholecalciferol (VITAMIN D3 GUMMIES ADULT PO) Take 2 each by mouth daily.     Coenzyme Q10 (COQ-10) 50 MG CAPS Take 50 mg by mouth daily.      glipiZIDE  (GLUCOTROL  XL) 5 MG 24 hr tablet Take 1 tablet (5 mg total) by mouth daily with breakfast. 90 tablet 3   hydrOXYzine  (VISTARIL ) 25 MG capsule Take 1 capsule (25 mg total) by mouth at bedtime. 90 capsule 3  ketoconazole  (NIZORAL ) 2 % shampoo Apply 1 Application topically 2 (two) times a week. 120 mL 3   lisinopril  (ZESTRIL ) 5 MG tablet Take 1 tablet (5 mg total) by mouth daily. 90 tablet 3   metFORMIN  (GLUCOPHAGE ) 1000 MG tablet Take 1 tablet (1,000 mg total) by mouth 2 (two) times daily with a meal. 180 tablet 3   Omega-3 Fatty Acids (FISH OIL) 1000 MG CAPS Take 1,000 mg by mouth daily.      polyethylene glycol powder (GLYCOLAX /MIRALAX ) 17 GM/SCOOP powder  Take 17 g by mouth 2 (two) times daily as needed. 850 g 1   prazosin  (MINIPRESS ) 1 MG capsule Take 1 capsule (1 mg total) by mouth at bedtime. 90 capsule 3   Prenatal Vit-Min-FA-Fish Oil ([employer] PRENATAL GUMMY PO) Take 2 tablets by mouth daily.     triamcinolone  cream (KENALOG ) 0.1 % APPLY 1 APPLICATION TWO TIMES A DAY FOR 2 WEEKS THEN AS NEEDED 45 g 3   No facility-administered medications prior to visit.   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   xrays 2020   DVT (deep venous thrombosis) (HCC) 2009   L calf   Headache    sinus   History of kidney stones    Pneumonia age 15 or 68   Prediabetes 02/21/2018   On metformin    Primary osteoarthritis of right hip 12/15/2016   PTSD (post-traumatic stress disorder)    Puncture wound    2010 left lower leg    Seasonal allergies    Past Surgical History:  Procedure Laterality Date   APPENDECTOMY     BOWEL RESECTION  02/04/2018   Procedure: SMALL BOWEL RESECTION;  Surgeon: Signe Mitzie LABOR, MD;  Location: [facility];  Service: General;;   HERNIA REPAIR     INGUINAL HERNIA REPAIR Right 02/04/2018   Procedure: HERNIA REPAIR INGUINAL ADULT;  Surgeon: Signe Mitzie LABOR, MD;  Location: [facility];  Service: General;  Laterality: Right;   LAPAROTOMY  02/04/2018   Procedure: EXPLORATORY LAPAROTOMY;  Surgeon: Signe Mitzie LABOR, MD;  Location: [facility];  Service: General;;   NASAL FRACTURE SURGERY     TOTAL HIP ARTHROPLASTY Left 02/01/2017   Procedure: LEFT TOTAL HIP ARTHROPLASTY ANTERIOR APPROACH;  Surgeon: Donnice Car, MD;  Location: [facility];  Service: Orthopedics;  Laterality: Left;   TOTAL HIP ARTHROPLASTY Right 05/24/2017   Procedure: RIGHT TOTAL HIP ARTHROPLASTY ANTERIOR APPROACH;  Surgeon: Car Donnice, MD;  Location: [facility];  Service: Orthopedics;   Laterality: Right;   Allergies[1]    Objective:    Physical Exam Vitals and nursing note reviewed.  Constitutional:      Appearance: Normal appearance.  Cardiovascular:     Rate and Rhythm: Normal rate and regular rhythm.  Pulmonary:     Effort: Pulmonary effort is normal.     Breath sounds: Normal breath sounds.  Musculoskeletal:        General: Normal range of motion.  Skin:    General: Skin is warm and dry.  Neurological:     Mental Status: She is alert.  Psychiatric:        Mood and Affect: Mood normal.        Behavior: Behavior normal.    BP 110/68 (BP Location: Left Arm, Patient Position: Sitting,  Cuff Size: Large)   Pulse (!) 58   Temp 98.4 F (36.9 C) (Temporal)   Ht 5' 9 (1.753 m)   Wt 170 lb (77.1 kg)   SpO2 98%   BMI 25.10 kg/m  Wt Readings from Last 3 Encounters:  10/18/24 170 lb (77.1 kg)  08/08/24 174 lb (78.9 kg)  03/05/24 172 lb 9.6 oz (78.3 kg)      Kesler Wickham, NP     [1]  Allergies Allergen Reactions   Sulfa Antibiotics Other (See Comments)    Family history, [place] [address] did ok with

## 2024-10-19 ENCOUNTER — Ambulatory Visit: Payer: Medicare (Managed Care)

## 2024-10-25 ENCOUNTER — Ambulatory Visit: Payer: Self-pay | Admitting: Family

## 2024-10-25 ENCOUNTER — Telehealth: Payer: Self-pay | Admitting: Radiology

## 2024-10-25 NOTE — Telephone Encounter (Signed)
 CXR marked for review today.

## 2024-10-26 ENCOUNTER — Emergency Department (HOSPITAL_BASED_OUTPATIENT_CLINIC_OR_DEPARTMENT_OTHER)
Admission: EM | Admit: 2024-10-26 | Discharge: 2024-10-26 | Disposition: A | Discharge status: Home / Self Care | Payer: Medicare (Managed Care) | Attending: Emergency Medicine | Admitting: Emergency Medicine

## 2024-10-26 ENCOUNTER — Encounter (HOSPITAL_BASED_OUTPATIENT_CLINIC_OR_DEPARTMENT_OTHER): Payer: Self-pay

## 2024-10-26 DIAGNOSIS — R519 Headache, unspecified: Secondary | ICD-10-CM | POA: Diagnosis present

## 2024-10-26 DIAGNOSIS — K029 Dental caries, unspecified: Secondary | ICD-10-CM | POA: Diagnosis not present

## 2024-10-26 MED ORDER — AMOXICILLIN 500 MG PO CAPS
1000.0000 mg | ORAL_CAPSULE | Freq: Once | ORAL | Status: AC
  Administered 2024-10-26: 1000 mg via ORAL
  Filled 2024-10-26: qty 2

## 2024-10-26 MED ORDER — AMOXICILLIN 500 MG PO CAPS: 1000.0000 mg | ORAL_CAPSULE | Freq: Two times a day (BID) | ORAL | 0 refills | Status: AC

## 2024-10-26 NOTE — ED Provider Notes (Signed)
 " [place] EM[facility]GENC[facility] AT [facility] Provider Note   CSN: [id number] Arrival date & time: 10/26/24  8087     Patient presents with: Dental Problem   Corey Collins is a 63 y.o. adult.   Patient to ED with symptoms maxillary pain for the last several days. No fever. No significant congestion. Also reports needing dental care so unsure if it is recurrent sinusitis or dental infections. No sore throat, cough, nausea. No ear pain.   The history is provided by the patient. No language interpreter was used.       Prior to Admission medications  Medication Sig Start Date End Date Taking? Authorizing Provider  amoxicillin  (AMOXIL ) 500 MG capsule Take 2 capsules (1,000 mg total) by mouth 2 (two) times daily. 10/26/24  Yes Tatiana Courter, Margit, PA-C  albuterol  (VENTOLIN  HFA) 108 (90 Base) MCG/ACT inhaler Inhale 2 puffs into the lungs every 4 (four) hours as needed for wheezing or shortness of breath. 09/19/24   Jodie Lavern CROME, MD  ALPRAZolam  (XANAX ) 0.5 MG tablet Take 1 tablet (0.5 mg total) by mouth at bedtime as needed for anxiety. 02/03/22   Jodie Lavern CROME, MD  atorvastatin  (LIPITOR) 10 MG tablet Take 1 tablet (10 mg total) by mouth daily. 08/13/24   Jodie Lavern CROME, MD  buPROPion  (WELLBUTRIN  XL) 300 MG 24 hr tablet Take 1 tablet (300 mg total) by mouth daily. 11/14/23   Jodie Lavern CROME, MD  Cholecalciferol (VITAMIN D3 GUMMIES ADULT PO) Take 2 each by mouth daily.    [provider]  Coenzyme Q10 (COQ-10) 50 MG CAPS Take 50 mg by mouth daily.     [provider]  glipiZIDE  (GLUCOTROL  XL) 5 MG 24 hr tablet Take 1 tablet (5 mg total) by mouth daily with breakfast. 08/13/24   Jodie Lavern CROME, MD  hydrOXYzine  (VISTARIL ) 25 MG capsule Take 1 capsule (25 mg total) by mouth at bedtime. 07/31/24   Jodie Lavern CROME, MD  ketoconazole  (NIZORAL ) 2 % shampoo Apply 1 Application topically 2 (two) times a week. 09/29/23   Jodie Lavern CROME, MD  lisinopril  (ZESTRIL ) 5 MG tablet  Take 1 tablet (5 mg total) by mouth daily. 03/16/24   Jodie Lavern CROME, MD  metFORMIN  (GLUCOPHAGE ) 1000 MG tablet Take 1 tablet (1,000 mg total) by mouth 2 (two) times daily with a meal. 05/10/24   Jodie Lavern CROME, MD  Omega-3 Fatty Acids (FISH OIL) 1000 MG CAPS Take 1,000 mg by mouth daily.     [provider]  polyethylene glycol powder (GLYCOLAX /MIRALAX ) 17 GM/SCOOP powder Take 17 g by mouth 2 (two) times daily as needed. 09/29/20   Jodie Lavern CROME, MD  prazosin  (MINIPRESS ) 1 MG capsule Take 1 capsule (1 mg total) by mouth at bedtime. 08/01/23   Jodie Lavern CROME, MD  Prenatal Vit-Min-FA-Fish Oil ([employer]S PRENATAL GUMMY PO) Take 2 tablets by mouth daily.    [provider]  triamcinolone  cream (KENALOG ) 0.1 % APPLY 1 APPLICATION TWO TIMES A DAY FOR 2 WEEKS THEN AS NEEDED 12/15/23   Jodie Lavern CROME, MD    Allergies: Sulfa antibiotics    Review of Systems  Updated Vital Signs BP 133/78   Pulse 81   Temp 98.5 F (36.9 C) (Oral)   Resp 16   SpO2 93%   Physical Exam Constitutional:      Appearance: She is well-developed.  HENT:     Head: Normocephalic.      Comments: No discrete cutaneous abscess.  Nose: Nose normal.     Comments: No mucosal swelling or erythema.     Mouth/Throat:     Mouth: Mucous membranes are moist.     Comments: Partially edentulous. There is moderate to severe decay of the upper canine and premolars bilaterally. No visualized abscess.  Cardiovascular:     Rate and Rhythm: Normal rate and regular rhythm.     Heart sounds: No murmur heard. Pulmonary:     Effort: Pulmonary effort is normal.     Breath sounds: Normal breath sounds. No wheezing, rhonchi or rales.  Musculoskeletal:        General: Normal range of motion.     Cervical back: Normal range of motion and neck supple.  Lymphadenopathy:     Cervical: No cervical adenopathy.  Skin:    General: Skin is warm and dry.  Neurological:     General: No focal deficit present.     Mental Status:  She is alert and oriented to person, place, and time.     (all labs ordered are listed, but only abnormal results are displayed) Labs Reviewed - No data to display  EKG: None  Radiology: No results found.   Procedures   Medications Ordered in the ED  amoxicillin  (AMOXIL ) capsule 1,000 mg (has no administration in time range)    Clinical Course as of 10/26/24 2246  Fri Oct 26, 2024  2243 Patient with facial pain, concern for infection. No fever. Likely odontogenic source of infection, possible maxillary sinusitis. Will start amoxicillin . He has already scheduled an appointment with PCP in 3 days. Recommended dental follow up.  [SU]    Clinical Course User Index [SU] Odell Balls, PA-C                                 Medical Decision Making       Final diagnoses:  Facial pain  Dental caries    ED Discharge Orders          Ordered    amoxicillin  (AMOXIL ) 500 MG capsule  2 times daily        10/26/24 2245               Odell Balls, PA-C 10/26/24 2246    Pamella Ozell LABOR, DO 11/01/24 1312  "

## 2024-10-26 NOTE — ED Triage Notes (Signed)
 Patient reports left upper sided dental pain and swelling. Has been doing peroxide rinses and taking with ibuprofen  with some positive results. Says he has a PCP visit Monday but does not think they can wait. Requesting antibiotics.

## 2024-10-26 NOTE — ED Notes (Signed)
 Reviewed discharge instructions, medications, and home care with pt. Pt verbalized understanding and had no further questions. Pt exited ED without complications.

## 2024-10-26 NOTE — Discharge Instructions (Signed)
 As we discussed, take the antibiotic for the full course. Keep the scheduled appointment with your doctor this Monday for recheck. Ibuprofen  600 mg (3 over-the-counter strength tablets) every 6 hours.

## 2024-10-29 ENCOUNTER — Ambulatory Visit: Payer: Medicare (Managed Care) | Admitting: Family

## 2024-10-29 ENCOUNTER — Encounter: Payer: Self-pay | Admitting: Family

## 2024-10-29 VITALS — BP 118/72 | HR 75 | Temp 98.0°F | Ht 69.0 in | Wt 165.6 lb

## 2024-10-29 DIAGNOSIS — F4310 Post-traumatic stress disorder, unspecified: Secondary | ICD-10-CM | POA: Diagnosis not present

## 2024-10-29 MED ORDER — PRAZOSIN HCL 1 MG PO CAPS: 1.0000 mg | ORAL_CAPSULE | Freq: Every day | ORAL | 3 refills | Status: AC

## 2024-10-29 NOTE — Progress Notes (Signed)
 "  Patient ID: Corey Collins, adult    DOB: [birth year], 62 y.o.   MRN: [id number]  Chief Complaint  Patient presents with   Dental Pain    Pt was seen in ED for it, she is on abx now and is feeling better just wanted to follow up with pcp since in hospital   Discussed the use of AI scribe software for clinical note transcription with the patient, who gave verbal consent to proceed.  History of Present Illness Corey Collins is a 63 year old male who presents with facial pain likely due to a sinus infection.  She developed severe facial pain on Friday night and went to the ER, where she was told a sinus infection was likely affecting a dental nerve. She started amoxicillin  but initially took an incorrect dose, then corrected it yesterday to the prescribed two pills. Since taking the correct dose, her facial pain has significantly improved and her face felt right again, though she has mild increased awareness of gums and lip without pain, tingling, or numbness. She uses hot compresses, steam in the shower, and ibuprofen  every five hours for pain, which provides only temporary relief. She tried taking ibuprofen  more often during severe pain but found that staying on the five-hour schedule worked better. She has PTSD with generalized anxiety and hypervigilance. Prazosin  helps her sleep and reduces nightmares, but she has run out of her refills. She smokes about four cigarettes daily, mainly when driving due to PTSD-related anxiety, and uses an inhaler as needed, especially after a recent illness.  Assessment & Plan Acute sinusitis with gum pain Acute sinusitis causing gum pain due to sinus pressure on dental nerve. Seen in ED and started on AMOX, symptoms are improved with treatment. - Continue amoxicillin  as directed. - Use nasal saline spray for dryness and disinfection prn. - Apply hot compresses for pain relief prn. - Call office if sx are still not improved after  finishing AMOX.  Asthma Intermittent wheezing and cough likely related to resolving cough. - Use Albuterol  inhaler as needed, especially morning and evening. - Ensure inhaler refills are available.  Post-traumatic stress disorder PTSD with anxiety, hypervigilance, and nightmares. Prazosin  effective for symptoms and sleep.  - Continue prazosin  1mg  qhs, refill sent. - F/U with PCP    Subjective:    Outpatient Medications Prior to Visit  Medication Sig Dispense Refill   amoxicillin  (AMOXIL ) 500 MG capsule Take 2 capsules (1,000 mg total) by mouth 2 (two) times daily. 40 capsule 0   albuterol  (VENTOLIN  HFA) 108 (90 Base) MCG/ACT inhaler Inhale 2 puffs into the lungs every 4 (four) hours as needed for wheezing or shortness of breath. 8.5 g 3   ALPRAZolam  (XANAX ) 0.5 MG tablet Take 1 tablet (0.5 mg total) by mouth at bedtime as needed for anxiety. 20 tablet 0   atorvastatin  (LIPITOR) 10 MG tablet Take 1 tablet (10 mg total) by mouth daily. 90 tablet 3   buPROPion  (WELLBUTRIN  XL) 300 MG 24 hr tablet Take 1 tablet (300 mg total) by mouth daily. 90 tablet 3   Cholecalciferol (VITAMIN D3 GUMMIES ADULT PO) Take 2 each by mouth daily.     Coenzyme Q10 (COQ-10) 50 MG CAPS Take 50 mg by mouth daily.      glipiZIDE  (GLUCOTROL  XL) 5 MG 24 hr tablet Take 1 tablet (5 mg total) by mouth daily with breakfast. 90 tablet 3   hydrOXYzine  (VISTARIL ) 25 MG capsule Take 1 capsule (25 mg total) by  mouth at bedtime. 90 capsule 3   ketoconazole  (NIZORAL ) 2 % shampoo Apply 1 Application topically 2 (two) times a week. 120 mL 3   lisinopril  (ZESTRIL ) 5 MG tablet Take 1 tablet (5 mg total) by mouth daily. 90 tablet 3   metFORMIN  (GLUCOPHAGE ) 1000 MG tablet Take 1 tablet (1,000 mg total) by mouth 2 (two) times daily with a meal. 180 tablet 3   Omega-3 Fatty Acids (FISH OIL) 1000 MG CAPS Take 1,000 mg by mouth daily.      polyethylene glycol powder (GLYCOLAX /MIRALAX ) 17 GM/SCOOP powder Take 17 g by mouth 2 (two) times  daily as needed. 850 g 1   Prenatal Vit-Min-FA-Fish Oil ([employer] PRENATAL GUMMY PO) Take 2 tablets by mouth daily.     triamcinolone  cream (KENALOG ) 0.1 % APPLY 1 APPLICATION TWO TIMES A DAY FOR 2 WEEKS THEN AS NEEDED 45 g 3   prazosin  (MINIPRESS ) 1 MG capsule Take 1 capsule (1 mg total) by mouth at bedtime. 90 capsule 3   No facility-administered medications prior to visit.   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   xrays 2020   DVT (deep venous thrombosis) (HCC) 2009   L calf   Headache    sinus   History of kidney stones    Pneumonia age 32 or 33   Prediabetes 02/21/2018   On metformin    Primary osteoarthritis of right hip 12/15/2016   PTSD (post-traumatic stress disorder)    Puncture wound    2010 left lower leg    Seasonal allergies    Past Surgical History:  Procedure Laterality Date   APPENDECTOMY     BOWEL RESECTION  02/04/2018   Procedure: SMALL BOWEL RESECTION;  Surgeon: Signe Mitzie LABOR, MD;  Location: [facility];  Service: General;;   HERNIA REPAIR     INGUINAL HERNIA REPAIR Right 02/04/2018   Procedure: HERNIA REPAIR INGUINAL ADULT;  Surgeon: Signe Mitzie LABOR, MD;  Location: [facility];  Service: General;  Laterality: Right;   LAPAROTOMY  02/04/2018   Procedure: EXPLORATORY LAPAROTOMY;  Surgeon: Signe Mitzie LABOR, MD;  Location: [facility];  Service: General;;   NASAL FRACTURE SURGERY     TOTAL HIP ARTHROPLASTY Left 02/01/2017   Procedure: LEFT TOTAL HIP ARTHROPLASTY ANTERIOR APPROACH;  Surgeon: Donnice Car, MD;  Location: [facility];  Service: Orthopedics;  Laterality: Left;   TOTAL HIP ARTHROPLASTY Right 05/24/2017   Procedure: RIGHT TOTAL HIP ARTHROPLASTY ANTERIOR APPROACH;  Surgeon: Car Donnice, MD;  Location: [facility];  Service: Orthopedics;  Laterality: Right;    Allergies[1]    Objective:    Physical Exam Vitals and nursing note reviewed.  Constitutional:      Appearance: Normal appearance.  Cardiovascular:     Rate and Rhythm: Normal rate and regular rhythm.  Pulmonary:     Effort: Pulmonary effort is normal.     Breath sounds: Normal breath sounds.  Musculoskeletal:        General: Normal range of motion.     Cervical back: No tenderness.  Skin:    General: Skin is warm and dry.  Neurological:     Mental Status: She is alert.  Psychiatric:        Mood and Affect: Mood normal.  Behavior: Behavior normal.    BP 118/72   Pulse 75   Temp 98 F (36.7 C) (Temporal)   Ht 5' 9 (1.753 m)   Wt 165 lb 9.6 oz (75.1 kg)   SpO2 98%   BMI 24.45 kg/m  Wt Readings from Last 3 Encounters:  10/29/24 165 lb 9.6 oz (75.1 kg)  10/18/24 170 lb (77.1 kg)  08/08/24 174 lb (78.9 kg)      Corey Daffin, NP     [1]  Allergies Allergen Reactions   Sulfa Antibiotics Other (See Comments)    Family history, [place] [address] did ok with   "

## 2024-11-06 ENCOUNTER — Other Ambulatory Visit: Payer: Self-pay | Admitting: Family Medicine

## 2024-11-06 NOTE — Telephone Encounter (Unsigned)
 Copied from CRM [id number]. Topic: Clinical - Medication Refill >> Nov 06, 2024  4:25 PM Viola F wrote: Medication: BuPROPion  (WELLBUTRIN  XL) 300 MG 24 hr tablet [[id number]]  Has the patient contacted their pharmacy? Yes (Agent: If no, request that the patient contact the pharmacy for the refill. If patient does not wish to contact the pharmacy document the reason why and proceed with request.) (Agent: If yes, when and what did the pharmacy advise?)  This is the patient's preferred pharmacy:   [id number] [place], [place] - [address] [place] [address] Phone: [phone] Fax: [phone]  Is this the correct pharmacy for this prescription? Yes If no, delete pharmacy and type the correct one.   Has the prescription been filled recently? Yes  Is the patient out of the medication? Yes, for a couple of days   Has the patient been seen for an appointment in the last year OR does the patient have an upcoming appointment? Yes  Can we respond through MyChart? Yes  Agent: Please be advised that Rx refills may take up to 3 business days. We ask that you follow-up with your pharmacy.

## 2024-11-07 MED ORDER — BUPROPION HCL ER (XL) 300 MG PO TB24: 300.0000 mg | ORAL_TABLET | Freq: Every day | ORAL | 3 refills | Status: AC

## 2024-11-16 ENCOUNTER — Other Ambulatory Visit: Payer: Self-pay | Admitting: Family Medicine

## 2024-11-16 MED ORDER — ALBUTEROL SULFATE HFA 108 (90 BASE) MCG/ACT IN AERS: 2.0000 | INHALATION_SPRAY | RESPIRATORY_TRACT | 3 refills | Status: AC | PRN

## 2024-11-16 NOTE — Telephone Encounter (Signed)
 Copied from CRM #[id number]. Topic: Clinical - Medication Refill >> Nov 16, 2024 12:48 PM Chasity T wrote: Medication: albuterol  (VENTOLIN  HFA) 108 (90 Base) MCG/ACT inhaler  Has the patient contacted their pharmacy? Yes   This is the patient's preferred pharmacy:  [id number] [place], [place] - [address] [place] [address] Phone: [phone] Fax: [phone]    Is this the correct pharmacy for this prescription? Yes If no, delete pharmacy and type the correct one.   Has the prescription been filled recently? No  Is the patient out of the medication? Yes  Has the patient been seen for an appointment in the last year OR does the patient have an upcoming appointment? Yes  Can we respond through MyChart? Yes  Agent: Please be advised that Rx refills may take up to 3 business days. We ask that you follow-up with your pharmacy.

## 2024-12-04 NOTE — Telephone Encounter (Signed)
 Called and spoke to patient and informed her that the albuterol  was indeed sent in on the ninth of January. Patient requested that the office call and follow up on medication. I called and spoke to pharmacist Rosina who did confirm that they did have the refill request from January ninth of this year thus the prescription just now showed up in their que along with another request that we sent today. Rosina verbally expressed that they will get the albuterol  filled today. Called and notified patient of the conversation between myself and ashley. Patient verbally expressed understanding and agreed to get her albuterol  once [employer] got her car out from a ditch in the snow.   Copied from CRM #[id number]. Topic: Clinical - Prescription Issue >> Dec 04, 2024  3:51 PM Brittany M wrote: Reason for CRM: Patient calling to check on refill request that was called in on 1/09 for  albuterol  (VENTOLIN  HFA) 108 (90 Base) MCG/ACT inhaler. He has not heard anything - pharmacy said it was not called in

## 2024-12-20 ENCOUNTER — Ambulatory Visit: Payer: Medicare (Managed Care)

## 2024-12-26 ENCOUNTER — Ambulatory Visit: Payer: Medicare (Managed Care)

## 2025-01-01 ENCOUNTER — Ambulatory Visit: Payer: Medicare (Managed Care) | Admitting: Family Medicine

## 2025-01-08 ENCOUNTER — Ambulatory Visit: Payer: Medicare (Managed Care) | Admitting: Family Medicine

## 2025-02-06 ENCOUNTER — Ambulatory Visit: Payer: Medicare (Managed Care) | Admitting: Family Medicine

## 2025-02-13 ENCOUNTER — Ambulatory Visit: Payer: Medicare (Managed Care) | Admitting: Family Medicine
